# Patient Record
Sex: Female | Born: 1975 | Race: Black or African American | Hispanic: No | Marital: Married | Smoking: Never smoker
Health system: Southern US, Community
[De-identification: ages and names within clinical notes are randomized; demographics above are authoritative.]

## PROBLEM LIST (undated history)

## (undated) ENCOUNTER — Inpatient Hospital Stay (HOSPITAL_COMMUNITY): Payer: Self-pay

## (undated) DIAGNOSIS — E119 Type 2 diabetes mellitus without complications: Secondary | ICD-10-CM

## (undated) DIAGNOSIS — Z87440 Personal history of urinary (tract) infections: Secondary | ICD-10-CM

## (undated) DIAGNOSIS — Z758 Other problems related to medical facilities and other health care: Secondary | ICD-10-CM

## (undated) DIAGNOSIS — R87629 Unspecified abnormal cytological findings in specimens from vagina: Secondary | ICD-10-CM

## (undated) DIAGNOSIS — Z603 Acculturation difficulty: Secondary | ICD-10-CM

## (undated) DIAGNOSIS — O24419 Gestational diabetes mellitus in pregnancy, unspecified control: Secondary | ICD-10-CM

## (undated) DIAGNOSIS — D649 Anemia, unspecified: Secondary | ICD-10-CM

## (undated) DIAGNOSIS — Z789 Other specified health status: Secondary | ICD-10-CM

## (undated) HISTORY — DX: Personal history of urinary (tract) infections: Z87.440

## (undated) HISTORY — DX: Unspecified abnormal cytological findings in specimens from vagina: R87.629

## (undated) HISTORY — DX: Acculturation difficulty: Z60.3

## (undated) HISTORY — DX: Type 2 diabetes mellitus without complications: E11.9

## (undated) HISTORY — DX: Anemia, unspecified: D64.9

## (undated) HISTORY — DX: Gestational diabetes mellitus in pregnancy, unspecified control: O24.419

## (undated) HISTORY — DX: Other specified health status: Z78.9

## (undated) HISTORY — DX: Other problems related to medical facilities and other health care: Z75.8

## (undated) HISTORY — PX: COLPOSCOPY: SHX161

---

## 2004-04-21 ENCOUNTER — Ambulatory Visit (HOSPITAL_COMMUNITY): Admission: RE | Admit: 2004-04-21 | Discharge: 2004-04-21 | Payer: Self-pay | Admitting: *Deleted

## 2004-09-17 ENCOUNTER — Ambulatory Visit: Payer: Self-pay | Admitting: *Deleted

## 2004-09-17 ENCOUNTER — Inpatient Hospital Stay (HOSPITAL_COMMUNITY): Admission: AD | Admit: 2004-09-17 | Discharge: 2004-09-20 | Payer: Self-pay | Admitting: *Deleted

## 2004-09-29 ENCOUNTER — Inpatient Hospital Stay (HOSPITAL_COMMUNITY): Admission: AD | Admit: 2004-09-29 | Discharge: 2004-09-29 | Payer: Self-pay | Admitting: *Deleted

## 2005-08-30 ENCOUNTER — Emergency Department (HOSPITAL_COMMUNITY): Admission: EM | Admit: 2005-08-30 | Discharge: 2005-08-30 | Payer: Self-pay | Admitting: Emergency Medicine

## 2005-10-22 ENCOUNTER — Ambulatory Visit: Payer: Self-pay | Admitting: Family Medicine

## 2005-10-23 ENCOUNTER — Ambulatory Visit: Payer: Self-pay | Admitting: *Deleted

## 2006-11-17 ENCOUNTER — Ambulatory Visit (HOSPITAL_COMMUNITY): Admission: RE | Admit: 2006-11-17 | Discharge: 2006-11-17 | Payer: Self-pay | Admitting: Family Medicine

## 2007-03-21 ENCOUNTER — Inpatient Hospital Stay (HOSPITAL_COMMUNITY): Admission: AD | Admit: 2007-03-21 | Discharge: 2007-03-23 | Payer: Self-pay | Admitting: Obstetrics & Gynecology

## 2007-03-21 ENCOUNTER — Ambulatory Visit: Payer: Self-pay | Admitting: Obstetrics & Gynecology

## 2009-02-24 ENCOUNTER — Inpatient Hospital Stay (HOSPITAL_COMMUNITY): Admission: AD | Admit: 2009-02-24 | Discharge: 2009-02-24 | Payer: Self-pay | Admitting: Obstetrics & Gynecology

## 2009-02-26 ENCOUNTER — Inpatient Hospital Stay (HOSPITAL_COMMUNITY): Admission: AD | Admit: 2009-02-26 | Discharge: 2009-02-26 | Payer: Self-pay | Admitting: Obstetrics & Gynecology

## 2009-03-04 ENCOUNTER — Inpatient Hospital Stay (HOSPITAL_COMMUNITY): Admission: AD | Admit: 2009-03-04 | Discharge: 2009-03-04 | Payer: Self-pay | Admitting: Obstetrics & Gynecology

## 2010-04-04 ENCOUNTER — Emergency Department (HOSPITAL_COMMUNITY): Admission: EM | Admit: 2010-04-04 | Discharge: 2010-04-04 | Payer: Self-pay | Admitting: Emergency Medicine

## 2010-11-01 ENCOUNTER — Inpatient Hospital Stay (INDEPENDENT_AMBULATORY_CARE_PROVIDER_SITE_OTHER)
Admission: RE | Admit: 2010-11-01 | Discharge: 2010-11-01 | Disposition: A | Discharge status: Home / Self Care | Payer: 59 | Source: Ambulatory Visit | Attending: Family Medicine | Admitting: Family Medicine

## 2010-11-01 DIAGNOSIS — J019 Acute sinusitis, unspecified: Secondary | ICD-10-CM

## 2010-11-01 DIAGNOSIS — J310 Chronic rhinitis: Secondary | ICD-10-CM

## 2010-12-22 LAB — URINE MICROSCOPIC-ADD ON

## 2010-12-22 LAB — CBC
HCT: 31.3 % — ABNORMAL LOW (ref 36.0–46.0)
Hemoglobin: 10.5 g/dL — ABNORMAL LOW (ref 12.0–15.0)
MCHC: 33.5 g/dL (ref 30.0–36.0)
MCV: 83.1 fL (ref 78.0–100.0)
Platelets: 314 10*3/uL (ref 150–400)
RBC: 3.76 MIL/uL — ABNORMAL LOW (ref 3.87–5.11)
RDW: 15.4 % (ref 11.5–15.5)
WBC: 6.1 10*3/uL (ref 4.0–10.5)

## 2010-12-22 LAB — URINALYSIS, ROUTINE W REFLEX MICROSCOPIC
Bilirubin Urine: NEGATIVE
Glucose, UA: NEGATIVE mg/dL
Ketones, ur: NEGATIVE mg/dL
Leukocytes, UA: NEGATIVE
Nitrite: NEGATIVE
Protein, ur: NEGATIVE mg/dL
Specific Gravity, Urine: <1.005 — ABNORMAL LOW (ref 1.005–1.030)
Urobilinogen, UA: 0.2 mg/dL (ref 0.0–1.0)
pH: 6 (ref 5.0–8.0)

## 2010-12-22 LAB — HCG, QUANTITATIVE, PREGNANCY
hCG, Beta Chain, Quant, S: 13 m[IU]/mL — ABNORMAL HIGH (ref <5)
hCG, Beta Chain, Quant, S: 1324 m[IU]/mL — ABNORMAL HIGH (ref <5)
hCG, Beta Chain, Quant, S: 148 m[IU]/mL — ABNORMAL HIGH (ref <5)

## 2010-12-22 LAB — ABO/RH: ABO/RH(D): O POS

## 2010-12-22 LAB — POCT PREGNANCY, URINE: Preg Test, Ur: POSITIVE

## 2010-12-22 LAB — WET PREP, GENITAL
Clue Cells Wet Prep HPF POC: NONE SEEN
Trich, Wet Prep: NONE SEEN
Yeast Wet Prep HPF POC: NONE SEEN

## 2010-12-22 LAB — GC/CHLAMYDIA PROBE AMP, GENITAL
Chlamydia, DNA Probe: NEGATIVE
GC Probe Amp, Genital: NEGATIVE

## 2011-01-10 ENCOUNTER — Emergency Department (HOSPITAL_COMMUNITY)
Admission: EM | Admit: 2011-01-10 | Discharge: 2011-01-10 | Disposition: A | Discharge status: Home / Self Care | Payer: 59 | Attending: Emergency Medicine | Admitting: Emergency Medicine

## 2011-01-10 DIAGNOSIS — K089 Disorder of teeth and supporting structures, unspecified: Secondary | ICD-10-CM | POA: Insufficient documentation

## 2011-01-10 DIAGNOSIS — R000 Tachycardia, unspecified: Secondary | ICD-10-CM | POA: Insufficient documentation

## 2011-04-14 ENCOUNTER — Ambulatory Visit (HOSPITAL_COMMUNITY)
Admission: RE | Admit: 2011-04-14 | Discharge: 2011-04-14 | Disposition: A | Discharge status: Home / Self Care | Payer: 59 | Source: Ambulatory Visit | Attending: Gynecology | Admitting: Gynecology

## 2011-04-14 NOTE — Progress Notes (Signed)
Genetic Counseling  High-Risk Gestation Note  Appointment Date:  04/14/2011 Referred By: Janifer Adie, MD Date of Birth:  1976/04/23 Partner:  Samuel [place] Ibnidris   I saw Ms. Gina Chung for preconception genetic counseling to discuss results of her cystic fibrosis carrier screen.   Ms. 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 We spent time reviewing the autosomal recessive inheritance of cystic fibrosis and the 25% chance, when both parents are carriers, to have a child with CF. We discussed that there are thought to be thousands of mutations which can cause the CF gene to not function properly.  However, there are also variants in the gene, or polymorphisms, which do not cause abnormal functioning of the gene.  Initially it was thought that the I148T mutation was a severe gene mutation.  As they have learned more about the gene and these mutations, it was learned that this was only a deleterious mutation when it was inherited in cis (on the same chromosome as) the 3199del6 mutation.  Ms. Rhodes was tested for the 3199del6 mutation and was found to be negative for that mutation.  The most recent literature suggests that the I148T mutation not even be tested for as part of a general population screening panel.  However, if it is part of the screening panel, the 3199del6 mutation should be tested in reflex, as was done for Ms. Ledo.  Thus, this is considered to be a negative CF carrier screen for Ms. Rufino. Ms. Zentner reported that her partner recently had CF carrier screening, which was negative for the mutations screened. We do not have her partner's lab report to confirm this report.  We reviewed that a negative CF screen reduces but does not eliminate the chance to be a CF carrier. CF carrier screening is reported to detect approximately 69% of carriers in the African American population. The exact detection rate and thus risk  reduction from a negative carrier screen in the [place] population is not known. Given that both Ms. Prange and her partner have had negative CF carrier screens, the risk for cystic fibrosis to be in a future pregnancy together has been significantly reduced. They understand that in [place], the newborn screening test will detect CF.  Both family histories were reviewed and found to be contributory  for deafness in a maternal first cousin once removed for the patient.  The underlying cause of her deafness is not known. She is otherwise healthy. Congenital deafness is heterogeneous, meaning there are many different genes which, if their function is altered, can result in deafness.  Environmental factors may also contribute to hearing loss.  Hearing loss may be one feature of an underlying genetic condition that may or may not place other family members at risk. Should they learn of a genetic cause, this might have implications for other family members.  Additional information is needed in order to assess the risk to the current pregnancy.   Additionally, the patient reported a nephew through her partner (his brother's son) who passed away in infancy due to bleeding following circumcision. The underlying cause is unknown. No additional relatives were reported to have symptoms of bleeding conditions, including this relative's other children.  Bleeding conditions are very common and encompass many specific disorders which can be passed through a family (inherited) in different ways.  However, it is unlikely that a future pregnancy for Ms. Woodhead and her partner is at increased  risk for a bleeding disorder given that neither the patient's partner nor his brother (the father of the affected son) have bleeding problems or any symptoms of a bleeding disorder.   The patient also reported her partner's two nephews (through a different brother) with what was described as ureter problems for one and extra fluid in  the kidneys requiring surgical correction for the other. They are otherwise in good health. We discussed that these features could be isolated or could occur due to an underlying genetic condition. Additional information is needed regarding the underlying cause in order to assess recurrence risk for relatives. Without further information regarding the provided family history, an accurate genetic risk cannot be calculated.   Further genetic counseling is warranted if more information is obtained. See scanned pedigree for complete family history information.   She denied exposure to environmental toxins or chemical agents.  She denied the use of alcohol, tobacco or street drugs.  She denied significant viral illnesses.  Her medical and surgical history were noncontributory.  We counseled the patient for approximately 25 minutes regarding the above risks.     Clydie Braun Imoni Kohen, MS, [facility] 04/14/2011

## 2011-06-15 ENCOUNTER — Encounter (HOSPITAL_COMMUNITY): Payer: Self-pay

## 2011-06-15 ENCOUNTER — Inpatient Hospital Stay (HOSPITAL_COMMUNITY)
Admission: AD | Admit: 2011-06-15 | Discharge: 2011-06-15 | Disposition: A | Discharge status: Home / Self Care | Payer: 59 | Source: Ambulatory Visit | Attending: Obstetrics and Gynecology | Admitting: Obstetrics and Gynecology

## 2011-06-15 DIAGNOSIS — R51 Headache: Secondary | ICD-10-CM

## 2011-06-15 DIAGNOSIS — R519 Headache, unspecified: Secondary | ICD-10-CM

## 2011-06-15 DIAGNOSIS — O99891 Other specified diseases and conditions complicating pregnancy: Secondary | ICD-10-CM | POA: Insufficient documentation

## 2011-06-15 DIAGNOSIS — R112 Nausea with vomiting, unspecified: Secondary | ICD-10-CM

## 2011-06-15 LAB — CBC
Hemoglobin: 11 g/dL — ABNORMAL LOW (ref 12.0–15.0)
MCH: 29.2 pg (ref 26.0–34.0)
MCHC: 33.6 g/dL (ref 30.0–36.0)
MCV: 86.7 fL (ref 78.0–100.0)
RBC: 3.77 MIL/uL — ABNORMAL LOW (ref 3.87–5.11)
WBC: 6 10*3/uL (ref 4.0–10.5)

## 2011-06-15 LAB — URINALYSIS, ROUTINE W REFLEX MICROSCOPIC
Bilirubin Urine: NEGATIVE
Glucose, UA: NEGATIVE mg/dL
Hgb urine dipstick: NEGATIVE
Leukocytes, UA: NEGATIVE
Nitrite: NEGATIVE
Protein, ur: NEGATIVE mg/dL
Specific Gravity, Urine: 1.02 (ref 1.005–1.030)
Urobilinogen, UA: 0.2 mg/dL (ref 0.0–1.0)
pH: 7.5 (ref 5.0–8.0)

## 2011-06-15 MED ORDER — PROMETHAZINE HCL 25 MG PO TABS: 25.0000 mg | ORAL_TABLET | Freq: Four times a day (QID) | ORAL | Status: AC | PRN

## 2011-06-15 MED ORDER — BUTALBITAL-APAP-CAFFEINE 50-325-40 MG PO TABS: 1.0000 | ORAL_TABLET | Freq: Four times a day (QID) | ORAL | Status: DC | PRN

## 2011-06-15 NOTE — Progress Notes (Signed)
Pt state she has a history of migraines. Has had a headache constantly for about 10 days, some days worse than others. Has had nausea and vomiting at least once every day for about 6 weeks. Feels tired and has shortness of breath worse with moving.

## 2011-06-15 NOTE — ED Provider Notes (Signed)
History   Pt presents today c/o daily HAs, nausea, and feeling tired. She states she does not have a HA today. She reports having vomiting about 1-2 times per day. She denies abd pain, vag dc, bleeding, or any other sx at this time.  Chief Complaint  Patient presents with  . Headache   HPI  OB History    Grav Para Term Preterm Abortions TAB SAB Ect Mult Living   4 2 2  0 1 0 1 0 0 2      Past Medical History  Diagnosis Date  . Migraine     History reviewed. No pertinent past surgical history.  No family history on file.  History  Substance Use Topics  . Smoking status: Never Smoker   . Smokeless tobacco: Never Used  . Alcohol Use: No    Allergies: No Known Allergies  Prescriptions prior to admission  Medication Sig Dispense Refill  . acetaminophen (TYLENOL) 500 MG tablet Take 500 mg by mouth every 6 (six) hours as needed. For headache.       . prenatal vitamin w/FE, FA (PRENATAL 1 + 1) 27-1 MG TABS Take 1 tablet by mouth daily.          Review of Systems  Constitutional: Positive for malaise/fatigue. Negative for fever.  Eyes: Negative for blurred vision.  Respiratory: Positive for shortness of breath. Negative for cough, hemoptysis, sputum production and wheezing.   Cardiovascular: Negative for chest pain and palpitations.  Gastrointestinal: Positive for nausea and vomiting. Negative for abdominal pain, diarrhea and constipation.  Genitourinary: Negative for dysuria, urgency, frequency and hematuria.  Neurological: Positive for headaches. Negative for dizziness.  Psychiatric/Behavioral: Negative for depression and suicidal ideas.   Physical Exam   Blood pressure 111/64, pulse 73, temperature 98.3 F (36.8 C), temperature source Oral, resp. rate 18, height 5' 3.5" (1.613 m), weight 154 lb 6.4 oz (70.035 kg), last menstrual period 03/20/2011, SpO2 99.00%.  Physical Exam  Constitutional: She is oriented to person, place, and time. She appears well-developed and  well-nourished. No distress.  HENT:  Head: Normocephalic and atraumatic.  Eyes: EOM are normal. Pupils are equal, round, and reactive to light.  Cardiovascular: Normal rate, regular rhythm and normal heart sounds.  Exam reveals no gallop and no friction rub.   No murmur heard. Respiratory: Effort normal and breath sounds normal. No respiratory distress. She has no wheezes. She has no rales. She exhibits no tenderness.  GI: Soft. She exhibits no distension and no mass. There is no tenderness. There is no rebound and no guarding.  Neurological: She is alert and oriented to person, place, and time.  Skin: Skin is warm and dry. She is not diaphoretic.  Psychiatric: She has a normal mood and affect. Her behavior is normal. Judgment and thought content normal.    MAU Course  Procedures  Results for orders placed during the hospital encounter of 06/15/11 (from the past 24 hour(s))  URINALYSIS, ROUTINE W REFLEX MICROSCOPIC     Status: Abnormal   Collection Time   06/15/11 10:20 AM      Component Value Range   Color, Urine YELLOW  YELLOW    Appearance CLOUDY (*) CLEAR    Specific Gravity, Urine 1.020  1.005 - 1.030    pH 7.5  5.0 - 8.0    Glucose, UA NEGATIVE  NEGATIVE (mg/dL)   Hgb urine dipstick NEGATIVE  NEGATIVE    Bilirubin Urine NEGATIVE  NEGATIVE    Ketones, ur NEGATIVE  NEGATIVE (mg/dL)  Protein, ur NEGATIVE  NEGATIVE (mg/dL)   Urobilinogen, UA 0.2  0.0 - 1.0 (mg/dL)   Nitrite NEGATIVE  NEGATIVE    Leukocytes, UA NEGATIVE  NEGATIVE   CBC     Status: Abnormal   Collection Time   06/15/11 10:30 AM      Component Value Range   WBC 6.0  4.0 - 10.5 (K/uL)   RBC 3.77 (*) 3.87 - 5.11 (MIL/uL)   Hemoglobin 11.0 (*) 12.0 - 15.0 (g/dL)   HCT 57.8 (*) 46.9 - 46.0 (%)   MCV 86.7  78.0 - 100.0 (fL)   MCH 29.2  26.0 - 34.0 (pg)   MCHC 33.6  30.0 - 36.0 (g/dL)   RDW 62.9  52.8 - 41.3 (%)   Platelets 279  150 - 400 (K/uL)     Assessment and Plan  Pregnancy: discussed with pt at  length. Will give Rx for fioricet for prn HA. Will give Rx for phenergan. She will begin prenatal care. Discussed diet, activity, risks, and precautions.  Clinton Gallant. Tasharra Nodine III, DrHSc, MPAS, PA-C  06/15/2011, 10:45 AM   Henrietta Hoover, PA 06/15/11 1102

## 2011-06-16 NOTE — ED Provider Notes (Signed)
Agree with above note.  Gina Chung 06/16/2011 7:48 AM

## 2011-06-30 LAB — CBC
HCT: 33.9 — ABNORMAL LOW
HCT: 37.5
Hemoglobin: 11.3 — ABNORMAL LOW
Hemoglobin: 12.5
MCHC: 33.3
MCHC: 33.4
MCV: 91.6
MCV: 91.9
Platelets: 239
Platelets: 250
RBC: 3.69 — ABNORMAL LOW
RBC: 4.1
RDW: 14.9 — ABNORMAL HIGH
RDW: 15.1 — ABNORMAL HIGH
WBC: 12.7 — ABNORMAL HIGH
WBC: 8.1

## 2011-06-30 LAB — RPR: RPR Ser Ql: NONREACTIVE

## 2011-08-11 ENCOUNTER — Other Ambulatory Visit (HOSPITAL_COMMUNITY): Payer: Self-pay | Admitting: Nurse Practitioner

## 2011-08-11 DIAGNOSIS — Z3689 Encounter for other specified antenatal screening: Secondary | ICD-10-CM

## 2011-08-11 LAB — RPR: RPR: NONREACTIVE

## 2011-08-11 LAB — ABO/RH

## 2011-08-11 LAB — HEPATITIS B SURFACE ANTIGEN: Hepatitis B Surface Ag: NEGATIVE

## 2011-08-11 LAB — ANTIBODY SCREEN: Antibody Screen: NEGATIVE

## 2011-08-11 LAB — HIV ANTIBODY (ROUTINE TESTING W REFLEX): HIV: NONREACTIVE

## 2011-08-11 LAB — GC/CHLAMYDIA PROBE AMP, GENITAL: Gonorrhea: NEGATIVE

## 2011-08-14 ENCOUNTER — Ambulatory Visit (HOSPITAL_COMMUNITY)
Admission: RE | Admit: 2011-08-14 | Discharge: 2011-08-14 | Disposition: A | Discharge status: Home / Self Care | Payer: 59 | Source: Ambulatory Visit | Attending: Nurse Practitioner | Admitting: Nurse Practitioner

## 2011-08-14 DIAGNOSIS — Z1389 Encounter for screening for other disorder: Secondary | ICD-10-CM | POA: Insufficient documentation

## 2011-08-14 DIAGNOSIS — O365990 Maternal care for other known or suspected poor fetal growth, unspecified trimester, not applicable or unspecified: Secondary | ICD-10-CM | POA: Insufficient documentation

## 2011-08-14 DIAGNOSIS — Z3689 Encounter for other specified antenatal screening: Secondary | ICD-10-CM

## 2011-08-14 DIAGNOSIS — O358XX0 Maternal care for other (suspected) fetal abnormality and damage, not applicable or unspecified: Secondary | ICD-10-CM | POA: Insufficient documentation

## 2011-08-14 DIAGNOSIS — Z363 Encounter for antenatal screening for malformations: Secondary | ICD-10-CM | POA: Insufficient documentation

## 2011-09-15 NOTE — L&D Delivery Note (Signed)
Delivery Note After 1 push, att 5:37 AM a viable female was delivered via Vaginal, Spontaneous Delivery (Presentation: Right Occiput Anterior).  APGAR: 8/8, ; weight .  pending Placenta status: ,intact with a 3V Cord:  with the following complications: None.  Anesthesia: Epidural  Episiotomy: none Lacerations: none Suture Repair: n/a Est. Blood Loss (mL): 350cc  Mom to postpartum.  Baby to nursery-stable. Delivery by Dr. Konrad Dolores with myself in attendance Gina Chung,Gina Chung 12/31/2011, 5:44 AM

## 2011-11-28 ENCOUNTER — Ambulatory Visit (INDEPENDENT_AMBULATORY_CARE_PROVIDER_SITE_OTHER): Payer: 59 | Admitting: Family Medicine

## 2011-11-28 VITALS — BP 129/79 | HR 99 | Temp 98.5°F | Resp 16 | Ht 63.0 in | Wt 174.0 lb

## 2011-11-28 DIAGNOSIS — J019 Acute sinusitis, unspecified: Secondary | ICD-10-CM

## 2011-11-28 DIAGNOSIS — H109 Unspecified conjunctivitis: Secondary | ICD-10-CM

## 2011-11-28 DIAGNOSIS — Z3480 Encounter for supervision of other normal pregnancy, unspecified trimester: Secondary | ICD-10-CM

## 2011-11-28 MED ORDER — TOBRAMYCIN 0.3 % OP SOLN: 1.0000 [drp] | Freq: Three times a day (TID) | OPHTHALMIC | Status: AC

## 2011-11-28 MED ORDER — MOMETASONE FUROATE 50 MCG/ACT NA SUSP: 2.0000 | Freq: Every day | NASAL | Status: DC

## 2011-11-28 MED ORDER — AMOXICILLIN 875 MG PO TABS: 875.0000 mg | ORAL_TABLET | Freq: Two times a day (BID) | ORAL | Status: AC

## 2011-11-28 NOTE — Progress Notes (Signed)
  Subjective:    Patient ID: Gina Chung, female    DOB: November 02, 1975, 36 y.o.   MRN: [id number]  HPI  Patient presents to clinic complaining of post nasal drainage and productive cough of purulent sputum for the last 10 days.  Cough more frequent at night and does experiences early am chest tightness. Patient complains of post tussive emesis Eyes slightly pink  Using OTC Robitussin  [redacted] weeks pregnant [facility]  Review of Systems  Constitutional: Negative for chills. Fever: tactile fever, using tylenol regularly   Eyes: Positive for redness (with light yellow ocular discharge) and itching (and patient rubbing eye).  Respiratory: Positive for cough. Negative for shortness of breath and wheezing.        Objective:   Physical Exam  Constitutional: She appears well-developed and well-nourished.  Eyes: Right conjunctiva is injected. Left conjunctiva is injected.  Neck: Neck supple.  Cardiovascular: Normal rate, regular rhythm and normal heart sounds.   Pulmonary/Chest: Effort normal and breath sounds normal.  Abdominal:    Lymphadenopathy:    She has cervical adenopathy.  Neurological: She is alert.  Skin: Skin is warm.  Psychiatric: She has a normal mood and affect.        Assessment & Plan:   1. Sinusitis acute  amoxicillin (AMOXIL) 875 MG tablet, mometasone (NASONEX) 50 MCG/ACT nasal spray  2. Conjunctivitis  tobramycin (TOBREX) 0.3 % ophthalmic solution  3. Normal pregnancy, 36 weeks     Anticipatory guidance

## 2011-11-28 NOTE — Progress Notes (Deleted)
  Subjective:    Patient ID: Gina Chung, female    DOB: Jun 29, 1976, 36 y.o.   MRN: [id number]  HPI    Review of Systems     Objective:   Physical Exam        Assessment & Plan:

## 2011-12-18 ENCOUNTER — Encounter (HOSPITAL_COMMUNITY): Payer: Self-pay | Admitting: *Deleted

## 2011-12-18 ENCOUNTER — Inpatient Hospital Stay (HOSPITAL_COMMUNITY)
Admission: AD | Admit: 2011-12-18 | Discharge: 2011-12-18 | Disposition: A | Discharge status: Home / Self Care | Payer: 59 | Source: Ambulatory Visit | Attending: Obstetrics and Gynecology | Admitting: Obstetrics and Gynecology

## 2011-12-18 DIAGNOSIS — R300 Dysuria: Secondary | ICD-10-CM | POA: Insufficient documentation

## 2011-12-18 DIAGNOSIS — R109 Unspecified abdominal pain: Secondary | ICD-10-CM | POA: Insufficient documentation

## 2011-12-18 LAB — URINALYSIS, ROUTINE W REFLEX MICROSCOPIC
Glucose, UA: NEGATIVE mg/dL
Leukocytes, UA: NEGATIVE
Nitrite: NEGATIVE
Protein, ur: NEGATIVE mg/dL
Urobilinogen, UA: 0.2 mg/dL (ref 0.0–1.0)

## 2011-12-18 LAB — CBC
Hemoglobin: 11.4 g/dL — ABNORMAL LOW (ref 12.0–15.0)
MCHC: 32.2 g/dL (ref 30.0–36.0)
Platelets: 224 10*3/uL (ref 150–400)
RBC: 3.83 MIL/uL — ABNORMAL LOW (ref 3.87–5.11)

## 2011-12-18 LAB — DIFFERENTIAL
Basophils Relative: 0 % (ref 0–1)
Monocytes Relative: 5 % (ref 3–12)
Neutro Abs: 5.1 10*3/uL (ref 1.7–7.7)
Neutrophils Relative %: 66 % (ref 43–77)

## 2011-12-18 LAB — WET PREP, GENITAL: Trich, Wet Prep: NONE SEEN

## 2011-12-18 NOTE — MAU Note (Signed)
Patient states she has had left flank pain and pain and burning with urination since yesterday. Has a history of UTI with this pregnancy with the same symptoms.

## 2011-12-18 NOTE — Discharge Instructions (Signed)
Pregnancy - Third Trimester The third trimester of pregnancy (the last 3 months) is a period of the most rapid growth for you and your baby. The baby approaches a length of 20 inches and a weight of 6 to 10 pounds. The baby is adding on fat and getting ready for life outside your body. While inside, babies have periods of sleeping and waking, suck their thumbs, and hiccups. You can often feel small contractions of the uterus. This is false labor. It is also called Braxton-Hicks contractions. This is like a practice for labor. The usual problems in this stage of pregnancy include more difficulty breathing, swelling of the hands and feet from water retention, and having to urinate more often because of the uterus and baby pressing on your bladder.  PRENATAL EXAMS  Blood work may continue to be done during prenatal exams. These tests are done to check on your health and the probable health of your baby. Blood work is used to follow your blood levels (hemoglobin). Anemia (low hemoglobin) is common during pregnancy. Iron and vitamins are given to help prevent this. You may also continue to be checked for diabetes. Some of the past blood tests may be done again.   The size of the uterus is measured during each visit. This makes sure your baby is growing properly according to your pregnancy dates.   Your blood pressure is checked every prenatal visit. This is to make sure you are not getting toxemia.   Your urine is checked every prenatal visit for infection, diabetes and protein.   Your weight is checked at each visit. This is done to make sure gains are happening at the suggested rate and that you and your baby are growing normally.   Sometimes, an ultrasound is performed to confirm the position and the proper growth and development of the baby. This is a test done that bounces harmless sound waves off the baby so your caregiver can more accurately determine due dates.   Discuss the type of pain  medication and anesthesia you will have during your labor and delivery.   Discuss the possibility and anesthesia if a Cesarean Section might be necessary.   Inform your caregiver if there is any mental or physical violence at home.  Sometimes, a specialized non-stress test, contraction stress test and biophysical profile are done to make sure the baby is not having a problem. Checking the amniotic fluid surrounding the baby is called an amniocentesis. The amniotic fluid is removed by sticking a needle into the belly (abdomen). This is sometimes done near the end of pregnancy if an early delivery is required. In this case, it is done to help make sure the baby's lungs are mature enough for the baby to live outside of the womb. If the lungs are not mature and it is unsafe to deliver the baby, an injection of cortisone medication is given to the mother 1 to 2 days before the delivery. This helps the baby's lungs mature and makes it safer to deliver the baby. CHANGES OCCURING IN THE THIRD TRIMESTER OF PREGNANCY Your body goes through many changes during pregnancy. They vary from person to person. Talk to your caregiver about changes you notice and are concerned about.  During the last trimester, you have probably had an increase in your appetite. It is normal to have cravings for certain foods. This varies from person to person and pregnancy to pregnancy.   You may begin to get stretch marks on your hips,   abdomen, and breasts. These are normal changes in the body during pregnancy. There are no exercises or medications to take which prevent this change.   Constipation may be treated with a stool softener or adding bulk to your diet. Drinking lots of fluids, fiber in vegetables, fruits, and whole grains are helpful.   Exercising is also helpful. If you have been very active up until your pregnancy, most of these activities can be continued during your pregnancy. If you have been less active, it is helpful  to start an exercise program such as walking. Consult your caregiver before starting exercise programs.   Avoid all smoking, alcohol, un-prescribed drugs, herbs and "street drugs" during your pregnancy. These chemicals affect the formation and growth of the baby. Avoid chemicals throughout the pregnancy to ensure the delivery of a healthy infant.   Backache, varicose veins and hemorrhoids may develop or get worse.   You will tire more easily in the third trimester, which is normal.   The baby's movements may be stronger and more often.   You may become short of breath easily.   Your belly button may stick out.   A yellow discharge may leak from your breasts called colostrum.   You may have a bloody mucus discharge. This usually occurs a few days to a week before labor begins.  HOME CARE INSTRUCTIONS   Keep your caregiver's appointments. Follow your caregiver's instructions regarding medication use, exercise, and diet.   During pregnancy, you are providing food for you and your baby. Continue to eat regular, well-balanced meals. Choose foods such as meat, fish, milk and other low fat dairy products, vegetables, fruits, and whole-grain breads and cereals. Your caregiver will tell you of the ideal weight gain.   A physical sexual relationship may be continued throughout pregnancy if there are no other problems such as early (premature) leaking of amniotic fluid from the membranes, vaginal bleeding, or belly (abdominal) pain.   Exercise regularly if there are no restrictions. Check with your caregiver if you are unsure of the safety of your exercises. Greater weight gain will occur in the last 2 trimesters of pregnancy. Exercising helps:   Control your weight.   Get you in shape for labor and delivery.   You lose weight after you deliver.   Rest a lot with legs elevated, or as needed for leg cramps or low back pain.   Wear a good support or jogging bra for breast tenderness during  pregnancy. This may help if worn during sleep. Pads or tissues may be used in the bra if you are leaking colostrum.   Do not use hot tubs, steam rooms, or saunas.   Wear your seat belt when driving. This protects you and your baby if you are in an accident.   Avoid raw meat, cat litter boxes and soil used by cats. These carry germs that can cause birth defects in the baby.   It is easier to loose urine during pregnancy. Tightening up and strengthening the pelvic muscles will help with this problem. You can practice stopping your urination while you are going to the bathroom. These are the same muscles you need to strengthen. It is also the muscles you would use if you were trying to stop from passing gas. You can practice tightening these muscles up 10 times a set and repeating this about 3 times per day. Once you know what muscles to tighten up, do not perform these exercises during urination. It is more likely   to cause an infection by backing up the urine.   Ask for help if you have financial, counseling or nutritional needs during pregnancy. Your caregiver will be able to offer counseling for these needs as well as refer you for other special needs.   Make a list of emergency phone numbers and have them available.   Plan on getting help from family or friends when you go home from the hospital.   Make a trial run to the hospital.   Take prenatal classes with the father to understand, practice and ask questions about the labor and delivery.   Prepare the baby's room/nursery.   Do not travel out of the city unless it is absolutely necessary and with the advice of your caregiver.   Wear only low or no heal shoes to have better balance and prevent falling.  MEDICATIONS AND DRUG USE IN PREGNANCY  Take prenatal vitamins as directed. The vitamin should contain 1 milligram of folic acid. Keep all vitamins out of reach of children. Only a couple vitamins or tablets containing iron may be fatal  to a baby or young child when ingested.   Avoid use of all medications, including herbs, over-the-counter medications, not prescribed or suggested by your caregiver. Only take over-the-counter or prescription medicines for pain, discomfort, or fever as directed by your caregiver. Do not use aspirin, ibuprofen (Motrin, Advil, Nuprin) or naproxen (Aleve) unless OK'd by your caregiver.   Let your caregiver also know about herbs you may be using.   Alcohol is related to a number of birth defects. This includes fetal alcohol syndrome. All alcohol, in any form, should be avoided completely. Smoking will cause low birth rate and premature babies.   Street/illegal drugs are very harmful to the baby. They are absolutely forbidden. A baby born to an addicted mother will be addicted at birth. The baby will go through the same withdrawal an adult does.  SEEK MEDICAL CARE IF: You have any concerns or worries during your pregnancy. It is better to call with your questions if you feel they cannot wait, rather than worry about them. DECISIONS ABOUT CIRCUMCISION You may or may not know the sex of your baby. If you know your baby is a boy, it may be time to think about circumcision. Circumcision is the removal of the foreskin of the penis. This is the skin that covers the sensitive end of the penis. There is no proven medical need for this. Often this decision is made on what is popular at the time or based upon religious beliefs and social issues. You can discuss these issues with your caregiver or pediatrician. SEEK IMMEDIATE MEDICAL CARE IF:   An unexplained oral temperature above 102 F (38.9 C) develops, or as your caregiver suggests.   You have leaking of fluid from the vagina (birth canal). If leaking membranes are suspected, take your temperature and tell your caregiver of this when you call.   There is vaginal spotting, bleeding or passing clots. Tell your caregiver of the amount and how many pads are  used.   You develop a bad smelling vaginal discharge with a change in the color from clear to white.   You develop vomiting that lasts more than 24 hours.   You develop chills or fever.   You develop shortness of breath.   You develop burning on urination.   You loose more than 2 pounds of weight or gain more than 2 pounds of weight or as suggested by your   caregiver.   You notice sudden swelling of your face, hands, and feet or legs.   You develop belly (abdominal) pain. Round ligament discomfort is a common non-cancerous (benign) cause of abdominal pain in pregnancy. Your caregiver still must evaluate you.   You develop a severe headache that does not go away.   You develop visual problems, blurred or double vision.   If you have not felt your baby move for more than 1 hour. If you think the baby is not moving as much as usual, eat something with sugar in it and lie down on your left side for an hour. The baby should move at least 4 to 5 times per hour. Call right away if your baby moves less than that.   You fall, are in a car accident or any kind of trauma.   There is mental or physical violence at home.  Document Released: 08/25/2001 Document Revised: 08/20/2011 Document Reviewed: 02/27/2009 [employer] Patient Information 2012 [employer]. 

## 2011-12-18 NOTE — MAU Provider Note (Signed)
Chief Complaint:  Flank Pain and Dysuria   First Provider Initiated Contact with Patient 12/18/11 1530     HPI  Gina Chung is  36 y.o. [id number] at [redacted]w[redacted]d presents with left flank pain and burning pain with urination.  She also reports increased white vaginal discharge since she started taking an antibiotic for recent URI. Denies contractions, leakage of fluid, vaginal bleeding, n/v, fever/chills. Reports good fetal movement.   Pregnancy Course: uncomplicated  Past Medical History: Past Medical History  Diagnosis Date  . Migraine     Past Surgical History: History reviewed. No pertinent past surgical history.  Family History: History reviewed. No pertinent family history.  Social History: History  Substance Use Topics  . Smoking status: Never Smoker   . Smokeless tobacco: Never Used  . Alcohol Use: No    Allergies: No Known Allergies  Meds:  Prescriptions prior to admission  Medication Sig Dispense Refill  . acetaminophen (TYLENOL) 500 MG tablet Take 1,000 mg by mouth every 6 (six) hours as needed. For headache.      . calcium carbonate (TUMS - DOSED IN MG ELEMENTAL CALCIUM) 500 MG chewable tablet Chew 4 tablets by mouth daily as needed. Stomach acid      . Prenatal Vit-Fe Fumarate-FA (PRENATAL MULTIVITAMIN) TABS Take 1 tablet by mouth daily.             Physical Exam  Blood pressure 134/86, pulse 94, temperature 98.6 F (37 C), temperature source Oral, resp. rate 16, height 5\' 3"  (1.6 m), weight 78.835 kg (173 lb 12.8 oz), last menstrual period 03/20/2011. GENERAL: Well-developed, well-nourished female in no acute distress.  ABDOMEN: Soft, nontender, nondistended, gravid.  EXTREMITIES: Nontender, no edema, 2+ distal pulses. SPECULUM EXAM: Cervix pink, moderate amount white discharge, vaginal walls and external genitalia normal CERVIX: 1.5cm/50/-2  FHT:  Baseline 135 , moderate variability, accelerations present, no decelerations Contractions: None    Labs: Recent Results (from the past 840 hour(s))  STREP B DNA PROBE      Component Value Range   GBS Negative    URINALYSIS, ROUTINE W REFLEX MICROSCOPIC   Collection Time   12/18/11  2:25 PM      Component Value Range   Color, Urine YELLOW  YELLOW    APPearance CLEAR  CLEAR    Specific Gravity, Urine <1.005 (*) 1.005 - 1.030    pH 6.5  5.0 - 8.0    Glucose, UA NEGATIVE  NEGATIVE (mg/dL)   Hgb urine dipstick NEGATIVE  NEGATIVE    Bilirubin Urine NEGATIVE  NEGATIVE    Ketones, ur NEGATIVE  NEGATIVE (mg/dL)   Protein, ur NEGATIVE  NEGATIVE (mg/dL)   Urobilinogen, UA 0.2  0.0 - 1.0 (mg/dL)   Nitrite NEGATIVE  NEGATIVE    Leukocytes, UA NEGATIVE  NEGATIVE   URINE CULTURE   Collection Time   12/18/11  2:25 PM      Component Value Range   Specimen Description OB CLEAN CATCH     Special Requests NONE     Culture  Setup Time [id number]     Colony Count 9,000 COLONIES/ML     Culture INSIGNIFICANT GROWTH     Report Status 12/20/2011 FINAL    WET PREP, GENITAL   Collection Time   12/18/11  3:37 PM      Component Value Range   Yeast Wet Prep HPF POC NONE SEEN  NONE SEEN    Trich, Wet Prep NONE SEEN  NONE SEEN    Clue Cells Wet  Prep HPF POC NONE SEEN  NONE SEEN    WBC, Wet Prep HPF POC FEW (*) NONE SEEN   GC/CHLAMYDIA PROBE AMP, GENITAL   Collection Time   12/18/11  3:40 PM      Component Value Range   GC Probe Amp, Genital NEGATIVE  NEGATIVE    Chlamydia, DNA Probe NEGATIVE  NEGATIVE   CBC   Collection Time   12/18/11  4:48 PM      Component Value Range   WBC 7.7  4.0 - 10.5 (K/uL)   RBC 3.83 (*) 3.87 - 5.11 (MIL/uL)   Hemoglobin 11.4 (*) 12.0 - 15.0 (g/dL)   HCT 16.1 (*) 09.6 - 46.0 (%)   MCV 92.4  78.0 - 100.0 (fL)   MCH 29.8  26.0 - 34.0 (pg)   MCHC 32.2  30.0 - 36.0 (g/dL)   RDW 04.5  40.9 - 81.1 (%)   Platelets 224  150 - 400 (K/uL)  DIFFERENTIAL   Collection Time   12/18/11  4:48 PM      Component Value Range   Neutrophils Relative 66  43 - 77 (%)   Neutro Abs 5.1   1.7 - 7.7 (K/uL)   Lymphocytes Relative 28  12 - 46 (%)   Lymphs Abs 2.2  0.7 - 4.0 (K/uL)   Monocytes Relative 5  3 - 12 (%)   Monocytes Absolute 0.4  0.1 - 1.0 (K/uL)   Eosinophils Relative 1  0 - 5 (%)   Eosinophils Absolute 0.1  0.0 - 0.7 (K/uL)   Basophils Relative 0  0 - 1 (%)   Basophils Absolute 0.0  0.0 - 0.1 (K/uL)  CBC   Collection Time   12/31/11  3:31 AM      Component Value Range   WBC 7.0  4.0 - 10.5 (K/uL)   RBC 3.87  3.87 - 5.11 (MIL/uL)   Hemoglobin 11.3 (*) 12.0 - 15.0 (g/dL)   HCT 91.4 (*) 78.2 - 46.0 (%)   MCV 91.5  78.0 - 100.0 (fL)   MCH 29.2  26.0 - 34.0 (pg)   MCHC 31.9  30.0 - 36.0 (g/dL)   RDW 95.6  21.3 - 08.6 (%)   Platelets 209  150 - 400 (K/uL)  RPR   Collection Time   12/31/11  3:31 AM      Component Value Range   RPR NON REACTIVE  NON REACTIVE     Imaging:  Not indicated  Assessment/Plan: Dysuria  D/C home with labor precautions Urine sent for culture F/U in office this week Return to MAU as needed  Medication List  As of 01/02/2012  6:55 AM   TAKE these medications         acetaminophen 500 MG tablet   Commonly known as: TYLENOL   Take 1,000 mg by mouth every 6 (six) hours as needed. For headache.      calcium carbonate 500 MG chewable tablet   Commonly known as: TUMS - dosed in mg elemental calcium   Chew 4 tablets by mouth daily as needed. Stomach acid      prenatal multivitamin Tabs   Take 1 tablet by mouth daily.               Gina Chung 4/5/20133:40 PM

## 2011-12-19 LAB — GC/CHLAMYDIA PROBE AMP, GENITAL: GC Probe Amp, Genital: NEGATIVE

## 2011-12-20 LAB — URINE CULTURE: Colony Count: 9000

## 2011-12-30 ENCOUNTER — Encounter (HOSPITAL_COMMUNITY): Payer: Self-pay | Admitting: *Deleted

## 2011-12-30 ENCOUNTER — Telehealth (HOSPITAL_COMMUNITY): Payer: Self-pay | Admitting: *Deleted

## 2011-12-30 NOTE — Telephone Encounter (Signed)
Preadmission screen  

## 2011-12-31 ENCOUNTER — Inpatient Hospital Stay (HOSPITAL_COMMUNITY)
Admission: AD | Admit: 2011-12-31 | Discharge: 2012-01-02 | DRG: 775 | Disposition: A | Discharge status: Home / Self Care | Payer: 59 | Source: Ambulatory Visit | Attending: Obstetrics & Gynecology | Admitting: Obstetrics & Gynecology

## 2011-12-31 ENCOUNTER — Encounter (HOSPITAL_COMMUNITY): Payer: Self-pay | Admitting: *Deleted

## 2011-12-31 ENCOUNTER — Encounter (HOSPITAL_COMMUNITY): Payer: Self-pay | Admitting: Anesthesiology

## 2011-12-31 ENCOUNTER — Inpatient Hospital Stay (HOSPITAL_COMMUNITY): Payer: 59 | Admitting: Anesthesiology

## 2011-12-31 LAB — CBC
HCT: 35.4 % — ABNORMAL LOW (ref 36.0–46.0)
Hemoglobin: 11.3 g/dL — ABNORMAL LOW (ref 12.0–15.0)
RBC: 3.87 MIL/uL (ref 3.87–5.11)
WBC: 7 10*3/uL (ref 4.0–10.5)

## 2011-12-31 LAB — RPR: RPR Ser Ql: NONREACTIVE

## 2011-12-31 MED ORDER — BENZOCAINE-MENTHOL 20-0.5 % EX AERO
1.0000 "application " | INHALATION_SPRAY | CUTANEOUS | Status: DC | PRN
  Administered 2011-12-31: 1 via TOPICAL

## 2011-12-31 MED ORDER — SENNOSIDES-DOCUSATE SODIUM 8.6-50 MG PO TABS
2.0000 | ORAL_TABLET | Freq: Every day | ORAL | Status: DC
  Administered 2011-12-31 – 2012-01-01 (×2): 2 via ORAL

## 2011-12-31 MED ORDER — METHYLERGONOVINE MALEATE 0.2 MG PO TABS: 0.2000 mg | ORAL_TABLET | ORAL | Status: DC | PRN

## 2011-12-31 MED ORDER — FENTANYL 2.5 MCG/ML BUPIVACAINE 1/10 % EPIDURAL INFUSION (WH - ANES)
14.0000 mL/h | INTRAMUSCULAR | Status: DC
  Filled 2011-12-31: qty 60

## 2011-12-31 MED ORDER — OXYTOCIN BOLUS FROM INFUSION
500.0000 mL | Freq: Once | INTRAVENOUS | Status: DC
  Filled 2011-12-31: qty 1000
  Filled 2011-12-31: qty 500

## 2011-12-31 MED ORDER — HYDROXYZINE HCL 50 MG PO TABS: 50.0000 mg | ORAL_TABLET | Freq: Four times a day (QID) | ORAL | Status: DC | PRN

## 2011-12-31 MED ORDER — PHENYLEPHRINE 40 MCG/ML (10ML) SYRINGE FOR IV PUSH (FOR BLOOD PRESSURE SUPPORT): 80.0000 ug | PREFILLED_SYRINGE | INTRAVENOUS | Status: DC | PRN

## 2011-12-31 MED ORDER — SIMETHICONE 80 MG PO CHEW: 80.0000 mg | CHEWABLE_TABLET | ORAL | Status: DC | PRN

## 2011-12-31 MED ORDER — METHYLERGONOVINE MALEATE 0.2 MG/ML IJ SOLN: 0.2000 mg | INTRAMUSCULAR | Status: DC | PRN

## 2011-12-31 MED ORDER — LANOLIN HYDROUS EX OINT: TOPICAL_OINTMENT | CUTANEOUS | Status: DC | PRN

## 2011-12-31 MED ORDER — FENTANYL 2.5 MCG/ML BUPIVACAINE 1/10 % EPIDURAL INFUSION (WH - ANES)
INTRAMUSCULAR | Status: DC | PRN
  Administered 2011-12-31: 14 mL/h via EPIDURAL

## 2011-12-31 MED ORDER — SODIUM BICARBONATE 8.4 % IV SOLN
INTRAVENOUS | Status: DC | PRN
  Administered 2011-12-31: 4 mL via EPIDURAL

## 2011-12-31 MED ORDER — LIDOCAINE HCL (PF) 1 % IJ SOLN: 30.0000 mL | INTRAMUSCULAR | Status: DC | PRN

## 2011-12-31 MED ORDER — DIPHENHYDRAMINE HCL 50 MG/ML IJ SOLN: 12.5000 mg | INTRAMUSCULAR | Status: DC | PRN

## 2011-12-31 MED ORDER — FLEET ENEMA 7-19 GM/118ML RE ENEM: 1.0000 | ENEMA | RECTAL | Status: DC | PRN

## 2011-12-31 MED ORDER — ACETAMINOPHEN 325 MG PO TABS: 650.0000 mg | ORAL_TABLET | ORAL | Status: DC | PRN

## 2011-12-31 MED ORDER — DIPHENHYDRAMINE HCL 25 MG PO CAPS: 25.0000 mg | ORAL_CAPSULE | Freq: Four times a day (QID) | ORAL | Status: DC | PRN

## 2011-12-31 MED ORDER — LACTATED RINGERS IV SOLN: 500.0000 mL | INTRAVENOUS | Status: DC | PRN

## 2011-12-31 MED ORDER — EPHEDRINE 5 MG/ML INJ
10.0000 mg | INTRAVENOUS | Status: DC | PRN
  Filled 2011-12-31: qty 4

## 2011-12-31 MED ORDER — PRENATAL MULTIVITAMIN CH
1.0000 | ORAL_TABLET | Freq: Every day | ORAL | Status: DC
  Administered 2011-12-31 – 2012-01-02 (×3): 1 via ORAL
  Filled 2011-12-31 (×3): qty 1

## 2011-12-31 MED ORDER — MEASLES, MUMPS & RUBELLA VAC ~~LOC~~ INJ
0.5000 mL | INJECTION | Freq: Once | SUBCUTANEOUS | Status: DC
  Filled 2011-12-31: qty 0.5

## 2011-12-31 MED ORDER — BENZOCAINE-MENTHOL 20-0.5 % EX AERO
INHALATION_SPRAY | CUTANEOUS | Status: AC
  Administered 2011-12-31: 1 via TOPICAL
  Filled 2011-12-31: qty 56

## 2011-12-31 MED ORDER — IBUPROFEN 600 MG PO TABS
600.0000 mg | ORAL_TABLET | Freq: Four times a day (QID) | ORAL | Status: DC
  Administered 2011-12-31 – 2012-01-02 (×9): 600 mg via ORAL
  Filled 2011-12-31 (×9): qty 1

## 2011-12-31 MED ORDER — OXYTOCIN 20 UNITS IN LACTATED RINGERS INFUSION - SIMPLE: 125.0000 mL/h | Freq: Once | INTRAVENOUS | Status: DC

## 2011-12-31 MED ORDER — ONDANSETRON HCL 4 MG/2ML IJ SOLN: 4.0000 mg | INTRAMUSCULAR | Status: DC | PRN

## 2011-12-31 MED ORDER — ZOLPIDEM TARTRATE 5 MG PO TABS: 5.0000 mg | ORAL_TABLET | Freq: Every evening | ORAL | Status: DC | PRN

## 2011-12-31 MED ORDER — TETANUS-DIPHTH-ACELL PERTUSSIS 5-2.5-18.5 LF-MCG/0.5 IM SUSP: 0.5000 mL | Freq: Once | INTRAMUSCULAR | Status: DC

## 2011-12-31 MED ORDER — OXYCODONE-ACETAMINOPHEN 5-325 MG PO TABS: 1.0000 | ORAL_TABLET | ORAL | Status: DC | PRN

## 2011-12-31 MED ORDER — PHENYLEPHRINE 40 MCG/ML (10ML) SYRINGE FOR IV PUSH (FOR BLOOD PRESSURE SUPPORT)
80.0000 ug | PREFILLED_SYRINGE | INTRAVENOUS | Status: DC | PRN
  Filled 2011-12-31: qty 5

## 2011-12-31 MED ORDER — ONDANSETRON HCL 4 MG PO TABS: 4.0000 mg | ORAL_TABLET | ORAL | Status: DC | PRN

## 2011-12-31 MED ORDER — WITCH HAZEL-GLYCERIN EX PADS: 1.0000 "application " | MEDICATED_PAD | CUTANEOUS | Status: DC | PRN

## 2011-12-31 MED ORDER — FERROUS SULFATE 325 (65 FE) MG PO TABS
325.0000 mg | ORAL_TABLET | Freq: Two times a day (BID) | ORAL | Status: DC
  Administered 2011-12-31 – 2012-01-02 (×5): 325 mg via ORAL
  Filled 2011-12-31 (×5): qty 1

## 2011-12-31 MED ORDER — ONDANSETRON HCL 4 MG/2ML IJ SOLN: 4.0000 mg | Freq: Four times a day (QID) | INTRAMUSCULAR | Status: DC | PRN

## 2011-12-31 MED ORDER — MAGNESIUM HYDROXIDE 400 MG/5ML PO SUSP: 30.0000 mL | ORAL | Status: DC | PRN

## 2011-12-31 MED ORDER — IBUPROFEN 600 MG PO TABS: 600.0000 mg | ORAL_TABLET | Freq: Four times a day (QID) | ORAL | Status: DC | PRN

## 2011-12-31 MED ORDER — OXYCODONE-ACETAMINOPHEN 5-325 MG PO TABS
1.0000 | ORAL_TABLET | ORAL | Status: DC | PRN
  Administered 2011-12-31 – 2012-01-01 (×4): 1 via ORAL
  Administered 2012-01-01: 2 via ORAL
  Administered 2012-01-02: 1 via ORAL
  Filled 2011-12-31: qty 2
  Filled 2011-12-31 (×5): qty 1

## 2011-12-31 MED ORDER — LACTATED RINGERS IV SOLN
INTRAVENOUS | Status: DC
  Administered 2011-12-31: 04:00:00 via INTRAVENOUS

## 2011-12-31 MED ORDER — OXYTOCIN 20 UNITS IN LACTATED RINGERS INFUSION - SIMPLE: 125.0000 mL/h | INTRAVENOUS | Status: DC | PRN

## 2011-12-31 MED ORDER — DIBUCAINE 1 % RE OINT
1.0000 "application " | TOPICAL_OINTMENT | RECTAL | Status: DC | PRN
  Administered 2011-12-31: 1 via RECTAL
  Filled 2011-12-31: qty 28

## 2011-12-31 MED ORDER — HYDROXYZINE HCL 50 MG/ML IM SOLN: 50.0000 mg | Freq: Four times a day (QID) | INTRAMUSCULAR | Status: DC | PRN

## 2011-12-31 MED ORDER — LACTATED RINGERS IV SOLN: 500.0000 mL | Freq: Once | INTRAVENOUS | Status: DC

## 2011-12-31 MED ORDER — EPHEDRINE 5 MG/ML INJ: 10.0000 mg | INTRAVENOUS | Status: DC | PRN

## 2011-12-31 MED ORDER — CITRIC ACID-SODIUM CITRATE 334-500 MG/5ML PO SOLN: 30.0000 mL | ORAL | Status: DC | PRN

## 2011-12-31 NOTE — Anesthesia Postprocedure Evaluation (Signed)
  Anesthesia Post-op Note  Patient: Gina Chung  Procedure(s) Performed: * No procedures listed *  Patient Location: PACU and Mother/Baby  Anesthesia Type: Epidural  Level of Consciousness: awake, alert , oriented, patient cooperative and responds to stimulation  Airway and Oxygen Therapy: Patient Spontanous Breathing  Post-op Pain: none  Post-op Assessment: Patient's Cardiovascular Status Stable, Respiratory Function Stable, No signs of Nausea or vomiting, Pain level controlled, No headache, No backache, No residual numbness and No residual motor weakness  Post-op Vital Signs: stable  Complications: No apparent anesthesia complications

## 2011-12-31 NOTE — Anesthesia Preprocedure Evaluation (Signed)

## 2011-12-31 NOTE — Anesthesia Procedure Notes (Signed)

## 2011-12-31 NOTE — H&P (Signed)
Gina Chung is a 36 y.o. female presenting with strong painful contractions. Maternal Medical History:  Reason for admission: Reason for admission: contractions and nausea.  Contractions: Onset was 3-5 hours ago.   Frequency: regular.   Perceived severity is strong.    Fetal activity: Perceived fetal activity is normal.   Last perceived fetal movement was within the past hour.    Prenatal complications: No bleeding, pre-eclampsia or substance abuse.     OB History    Grav Para Term Preterm Abortions TAB SAB Ect Mult Living   4 2 2  0 1 0 1 0 0 2     Past Medical History  Diagnosis Date  . Migraine   . History of UTI   . Language Barrier     arabic and english  . Anemia    Past Surgical History  Procedure Date  . Colposcopy    Family History: family history includes Hypertension in her father and Rheum arthritis in her mother. Social History:  reports that she has never smoked. She has never used smokeless tobacco. She reports that she does not drink alcohol or use illicit drugs.  Review of Systems  Constitutional: Negative for fever.  Eyes: Negative for blurred vision.  Respiratory: Negative for shortness of breath.   Cardiovascular: Negative for chest pain.  Gastrointestinal: Positive for nausea and vomiting. Negative for heartburn, abdominal pain, diarrhea, constipation and blood in stool.  Genitourinary: Negative for dysuria, urgency and frequency.  Skin: Negative for rash.  Neurological: Negative for sensory change and headaches.    Dilation: 8 Effacement (%): 100 Station: 0 Exam by:: Bennye Alm, RN Blood pressure 144/91, pulse 112, temperature 98.4 F (36.9 C), temperature source Oral, resp. rate 20, height 5\' 5"  (1.651 m), weight 79.379 kg (175 lb), last menstrual period 03/20/2011, SpO2 100.00%. Maternal Exam:  Uterine Assessment: Contraction strength is firm.  Contraction frequency is regular.   Abdomen: Fetal presentation: vertex  Pelvis: adequate for  delivery.   Cervix: Cervix evaluated by digital exam.     Physical Exam  Constitutional: She is oriented to person, place, and time. She appears well-developed and well-nourished.  HENT:  Head: Atraumatic.  Eyes: EOM are normal.  Neck: Normal range of motion.  Cardiovascular: Normal rate and regular rhythm.   Respiratory: Effort normal.  GI: There is no tenderness. There is no rebound and no guarding.  Genitourinary: Vagina normal and uterus normal.  Musculoskeletal: Normal range of motion.  Neurological: She is alert and oriented to person, place, and time. She has normal reflexes.  Skin: Skin is warm and dry.    Prenatal labs: ABO, Rh: O/Positive/-- (11/27 0000) Antibody: Negative (11/27 0000) Rubella: Immune (11/27 0000) RPR: Nonreactive (11/27 0000)  HBsAg: Negative (11/27 0000)  HIV: Non-reactive (11/27 0000)  GBS: Negative (03/19 0000)   Dilation: 8 Effacement (%): 100 Cervical Position: Middle Station: 0 Presentation: Vertex Exam by:: Bennye Alm, RN  Assessment/Plan: 36yo [redacted]w[redacted]d G4P2012 here in active labor. - Fetal/maternal monitoring - Epidural - Expectant management for SVD  [name] 12/31/2011, 4:10 AM

## 2011-12-31 NOTE — MAU Note (Signed)
Pt states she has been feeling pain since 1200

## 2012-01-01 ENCOUNTER — Other Ambulatory Visit: Payer: 59

## 2012-01-01 MED ORDER — IBUPROFEN 600 MG PO TABS: 600.0000 mg | ORAL_TABLET | Freq: Four times a day (QID) | ORAL | Status: AC | PRN

## 2012-01-01 NOTE — Discharge Summary (Addendum)
Obstetric Discharge Summary Reason for Admission: onset of labor Prenatal Procedures: NST and ultrasound Intrapartum Procedures: spontaneous vaginal delivery Postpartum Procedures: none Complications-Operative and Postpartum: none Hemoglobin  Date Value Range Status  12/31/2011 11.3* 12.0-15.0 (g/dL) Final     HCT  Date Value Range Status  12/31/2011 35.4* 36.0-46.0 (%) Final    Physical Exam:  General: alert, appears stated age and no distress Lochia: appropriate Uterine Fundus: firm Incision: n/a DVT Evaluation: No evidence of DVT seen on physical exam.  Discharge Diagnoses: Term Pregnancy-delivered  Discharge Information: Date: 01/01/2012 Activity: pelvic rest Diet: routine Medications: Ibuprofen Condition: stable Instructions: refer to practice specific booklet Discharge to: home Follow-up Information    Call [facility] [place]. (Follow up in 4-6 weeks for postpartum visit, call next week to schedule your appointment)    Contact information:   [address] [facility] [place] [address]          Newborn Data: Live born female  Birth Weight: 7 lb 15.3 oz (3610 g) APGAR: 8, 8  Home with mother.  SHORES,SUZANNE E. 01/01/2012, 6:54 AM  Baby was not released by pediatrician on PPD#1.  Mother being discharged today.  Exam unchanged from yesterday.  Candelaria Celeste JEHIEL 01/02/2012 7:39 AM

## 2012-01-01 NOTE — Discharge Summary (Signed)
Attestation of Attending Supervision of Advanced Practitioner: Evaluation and management procedures were performed by the [facility] Fellow/PA/CNM/NP under my supervision and collaboration. Chart reviewed, and agree with management and plan.  Jaynie Collins, M.D. 01/01/2012 2:14 PM

## 2012-01-01 NOTE — Progress Notes (Signed)
Post Partum Day 1 Subjective: no complaints and desires early discharge  Objective: Blood pressure 116/79, pulse 87, temperature 98.5 F (36.9 C), temperature source Oral, resp. rate 18, height 5\' 5"  (1.651 m), weight 175 lb (79.379 kg), last menstrual period 03/20/2011, SpO2 95.00%, unknown if currently breastfeeding.  Physical Exam:  General: alert, cooperative and no distress Lochia: appropriate Uterine Fundus: firm Incision: n/a DVT Evaluation: No evidence of DVT seen on physical exam.   Basename 12/31/11 0331  HGB 11.3*  HCT 35.4*    Assessment/Plan: Discharge home, Breastfeeding and Contraception natural family planning   LOS: 1 day   Jenella Craigie E. 01/01/2012, 6:50 AM

## 2012-01-01 NOTE — Discharge Instructions (Signed)

## 2012-01-04 ENCOUNTER — Inpatient Hospital Stay (HOSPITAL_COMMUNITY): Admission: RE | Admit: 2012-01-04 | Payer: 59 | Source: Ambulatory Visit

## 2012-07-16 ENCOUNTER — Emergency Department (INDEPENDENT_AMBULATORY_CARE_PROVIDER_SITE_OTHER)
Admission: EM | Admit: 2012-07-16 | Discharge: 2012-07-16 | Disposition: A | Discharge status: Home / Self Care | Payer: 59 | Source: Home / Self Care | Attending: Emergency Medicine | Admitting: Emergency Medicine

## 2012-07-16 ENCOUNTER — Encounter (HOSPITAL_COMMUNITY): Payer: Self-pay | Admitting: Emergency Medicine

## 2012-07-16 DIAGNOSIS — J020 Streptococcal pharyngitis: Secondary | ICD-10-CM

## 2012-07-16 LAB — POCT PREGNANCY, URINE: Preg Test, Ur: NEGATIVE

## 2012-07-16 LAB — POCT RAPID STREP A: Streptococcus, Group A Screen (Direct): POSITIVE — AB

## 2012-07-16 MED ORDER — AMOXICILLIN 500 MG PO CAPS: 500.0000 mg | ORAL_CAPSULE | Freq: Three times a day (TID) | ORAL | Status: AC

## 2012-07-16 MED ORDER — IBUPROFEN 600 MG PO TABS: 600.0000 mg | ORAL_TABLET | Freq: Four times a day (QID) | ORAL | Status: DC | PRN

## 2012-07-16 NOTE — ED Notes (Signed)
Pt c/o sore throat x 4 days and fever that started yesterday evening.   Did not check temp but felt feverish and had night sweats last night.  Pt states that her throat feels swollen and hurts with swallowing.   Pt has used tylenol and ibuprofen with no relief of pain.  Last dose of tylenol was this a.m at 6 a.m

## 2012-07-16 NOTE — ED Provider Notes (Signed)
History     CSN: [id number]  Arrival date & time 07/16/12  1031   First MD Initiated Contact with Patient 07/16/12 1032      Chief Complaint  Patient presents with  . Sore Throat    sore throat x 4 days. fever started yesterday evening.    (Consider location/radiation/quality/duration/timing/severity/associated sxs/prior treatment) HPI Comments: Patient presents urgent care complaining of a sore throat and fevers for 4 days. She feels no appetite and bodyaches and headache. She has been trying some Tylenol or Motrin for pain and fevers. Last dose was early this morning. It hurts to swallow. Denies any cough or shortness of breath nausea vomiting or abdominal pain  The history is provided by the patient.    Past Medical History  Diagnosis Date  . Migraine   . History of UTI   . Language barrier     arabic and english  . Anemia     Past Surgical History  Procedure Date  . Colposcopy     Family History  Problem Relation Age of Onset  . Rheum arthritis Mother   . Hypertension Father     History  Substance Use Topics  . Smoking status: Never Smoker   . Smokeless tobacco: Never Used  . Alcohol Use: No    OB History    Grav Para Term Preterm Abortions TAB SAB Ect Mult Living   4 3 3  0 1 0 1 0 0 3      Review of Systems  Constitutional: Positive for fever, activity change, appetite change and fatigue.  HENT: Positive for nosebleeds and congestion. Negative for neck pain, neck stiffness and postnasal drip.   Skin: Negative for rash and wound.  Neurological: Positive for headaches. Negative for dizziness.  Psychiatric/Behavioral: The patient is not nervous/anxious.     Allergies  Review of patient's allergies indicates no known allergies.  Home Medications   Current Outpatient Rx  Name Route Sig Dispense Refill  . ACETAMINOPHEN 500 MG PO TABS Oral Take 1,000 mg by mouth every 6 (six) hours as needed. For headache.    Marland Kitchen PRENATAL MULTIVITAMIN CH Oral Take 1  tablet by mouth daily.    . AMOXICILLIN 500 MG PO CAPS Oral Take 1 capsule (500 mg total) by mouth 3 (three) times daily. 21 capsule 0  . CALCIUM CARBONATE ANTACID 500 MG PO CHEW Oral Chew 4 tablets by mouth daily as needed. Stomach acid    . IBUPROFEN 600 MG PO TABS Oral Take 1 tablet (600 mg total) by mouth every 6 (six) hours as needed for pain. 15 tablet 0    BP 137/90  Pulse 92  Temp 98.8 F (37.1 C) (Oral)  Resp 17  SpO2 99%  LMP 06/13/2012  Breastfeeding? Yes  Physical Exam  Nursing note and vitals reviewed. Constitutional: She appears well-developed and well-nourished.  HENT:  Head: Normocephalic.  Mouth/Throat: Uvula is midline. Oropharyngeal exudate and posterior oropharyngeal erythema present. No tonsillar abscesses.  Eyes: Conjunctivae normal are normal. Right eye exhibits no discharge.  Neck: Neck supple.  Cardiovascular: Normal rate.   No murmur heard. Pulmonary/Chest: Effort normal and breath sounds normal.  Lymphadenopathy:    She has cervical adenopathy.  Skin: Skin is warm. No erythema.    ED Course  Procedures (including critical care time)  Labs Reviewed  POCT RAPID STREP A ([facility] ONLY) - Abnormal; Notable for the following:    Streptococcus, Group A Screen (Direct) POSITIVE (*)     All other  components within normal limits  POCT PREGNANCY, URINE   No results found.   1. Streptococcal pharyngitis       MDM   Patient with streptococcal uncomplicated pharyngitis. She has been started on amoxicillin. Encouraged to return if no improvement is noted after 3-5 days. She acknowledges and agrees with treatment plan and followup care as necessary.       Jimmie Molly, MD 07/16/12 1500

## 2012-09-14 ENCOUNTER — Emergency Department (INDEPENDENT_AMBULATORY_CARE_PROVIDER_SITE_OTHER)
Admission: EM | Admit: 2012-09-14 | Discharge: 2012-09-14 | Disposition: A | Discharge status: Home / Self Care | Payer: 59 | Source: Home / Self Care

## 2012-09-14 ENCOUNTER — Encounter (HOSPITAL_COMMUNITY): Payer: Self-pay | Admitting: Emergency Medicine

## 2012-09-14 DIAGNOSIS — J069 Acute upper respiratory infection, unspecified: Secondary | ICD-10-CM

## 2012-09-14 MED ORDER — PHENYLEPHRINE-CHLORPHEN-DM 10-4-12.5 MG/5ML PO LIQD: 5.0000 mL | ORAL | Status: DC | PRN

## 2012-09-14 NOTE — ED Provider Notes (Signed)
History     CSN: [id number]  Arrival date & time 09/14/12  1709   None     Chief Complaint  Patient presents with  . URI    (Consider location/radiation/quality/duration/timing/severity/associated sxs/prior treatment) HPI Comments: 37 year old female presents with cough and PND. She states earlier today she had temperature of 100. She denies shortness of breath, runny nose, earache, malaise or fatigue. She feels well otherwise.   Past Medical History  Diagnosis Date  . Migraine   . History of UTI   . Language barrier     arabic and english  . Anemia     Past Surgical History  Procedure Date  . Colposcopy     Family History  Problem Relation Age of Onset  . Rheum arthritis Mother   . Hypertension Father     History  Substance Use Topics  . Smoking status: Never Smoker   . Smokeless tobacco: Never Used  . Alcohol Use: No    OB History    Grav Para Term Preterm Abortions TAB SAB Ect Mult Living   4 3 3  0 1 0 1 0 0 3      Review of Systems  Constitutional: Positive for fever. Negative for chills, activity change, appetite change and fatigue.  HENT: Positive for postnasal drip. Negative for congestion, facial swelling, rhinorrhea, neck pain and neck stiffness.   Eyes: Negative.   Respiratory: Positive for cough.   Cardiovascular: Negative.   Gastrointestinal: Negative.   Skin: Negative for pallor and rash.  Neurological: Negative.     Allergies  Review of patient's allergies indicates no known allergies.  Home Medications   Current Outpatient Rx  Name  Route  Sig  Dispense  Refill  . ACETAMINOPHEN 500 MG PO TABS   Oral   Take 1,000 mg by mouth every 6 (six) hours as needed. For headache.         Marland Kitchen CALCIUM CARBONATE ANTACID 500 MG PO CHEW   Oral   Chew 4 tablets by mouth daily as needed. Stomach acid         . IBUPROFEN 600 MG PO TABS   Oral   Take 1 tablet (600 mg total) by mouth every 6 (six) hours as needed for pain.   15 tablet   0   . PRENATAL MULTIVITAMIN CH   Oral   Take 1 tablet by mouth daily.           BP 134/86  Pulse 98  Temp 98 F (36.7 C) (Oral)  Resp 19  SpO2 100%  Breastfeeding? Yes  Physical Exam  Constitutional: She is oriented to person, place, and time. She appears well-developed and well-nourished. No distress.  HENT:       TMs are normal bilaterally Mild erythema with clear PND in the oropharynx. No exudates.  Eyes: Conjunctivae normal and EOM are normal.  Neck: Normal range of motion. Neck supple.  Cardiovascular: Normal rate, regular rhythm and normal heart sounds.   Pulmonary/Chest: Effort normal and breath sounds normal. No respiratory distress. She has no wheezes. She has no rales.  Musculoskeletal: Normal range of motion. She exhibits no edema.  Lymphadenopathy:    She has no cervical adenopathy.  Neurological: She is alert and oriented to person, place, and time.  Skin: Skin is warm and dry. No rash noted.  Psychiatric: She has a normal mood and affect.    ED Course  Procedures (including critical care time)  Labs Reviewed - No data to display No  results found.   1. URI (upper respiratory infection)       MDM  Respiratory exam is normal, she is afebrile and stable. She is concerned that her cough is due to pneumonia since her husband had pneumonia a week ago. Her lungs are clear and there is no evidence of that at this time. She does have clear PND and I suspect she is coughing from that. Will treat with Norell CS 1 teaspoon q. 4-6 hours when necessary cough and congestion. History of pneumonia red flags and warning symptoms and return if necessary. For any increased fever, increased cough or shortness of breath or other symptoms but return .         Hayden Rasmussen, NP 09/14/12 1910

## 2012-09-14 NOTE — ED Notes (Addendum)
Pt c/o cold sx x1 week Sx include: cough w/green sputum, fevers, nauseas Denies: vomiting, diarrhea She is alert w/no signs of acute distress Has taken OTC cold meds.  Mom is breastfeeding.

## 2012-09-14 NOTE — L&D Delivery Note (Signed)
Delivery Note At 9:48 PM a viable female was delivered via  (Presentation: ;  ).  APGAR: , ; weight .   Placenta status: , .  Cord:  with the following complications: .  Cord pH: not done  Anesthesia: Epidural  Episiotomy:  Lacerations:  Suture Repair: 2.0 Est. Blood Loss (mL):   Mom to postpartum.  Baby to nursery-stable.  Gina Chung A 06/08/2013, 9:54 PM

## 2012-09-15 NOTE — ED Notes (Signed)
Per Dr. Artis Flock advised pt she could take delsym for cough due to the fact that she is breast feeding

## 2012-09-15 NOTE — ED Provider Notes (Signed)
Medical screening examination/treatment/procedure(s) were performed by resident physician or non-physician practitioner and as supervising physician I was immediately available for consultation/collaboration.   KINDL,JAMES DOUGLAS MD.    James D Kindl, MD 09/15/12 2026 

## 2012-09-20 ENCOUNTER — Ambulatory Visit (INDEPENDENT_AMBULATORY_CARE_PROVIDER_SITE_OTHER): Payer: 59 | Admitting: Family Medicine

## 2012-09-20 ENCOUNTER — Telehealth: Payer: Self-pay

## 2012-09-20 ENCOUNTER — Ambulatory Visit: Payer: 59

## 2012-09-20 VITALS — BP 133/82 | HR 91 | Temp 98.3°F | Resp 16 | Ht 62.0 in | Wt 188.0 lb

## 2012-09-20 DIAGNOSIS — R05 Cough: Secondary | ICD-10-CM

## 2012-09-20 DIAGNOSIS — J189 Pneumonia, unspecified organism: Secondary | ICD-10-CM

## 2012-09-20 DIAGNOSIS — R059 Cough, unspecified: Secondary | ICD-10-CM

## 2012-09-20 LAB — POCT CBC
Granulocyte percent: 39.4 %G (ref 37–80)
Lymph, poc: 4 — AB (ref 0.6–3.4)
MCHC: 31.7 g/dL — AB (ref 31.8–35.4)
MCV: 89.1 fL (ref 80–97)
MID (cbc): 0.5 (ref 0–0.9)
POC Granulocyte: 2.9 (ref 2–6.9)
POC LYMPH PERCENT: 53.7 %L — AB (ref 10–50)
Platelet Count, POC: 392 10*3/uL (ref 142–424)
RDW, POC: 16.7 %

## 2012-09-20 MED ORDER — GUAIFENESIN ER 1200 MG PO TB12: 1.0000 | ORAL_TABLET | Freq: Two times a day (BID) | ORAL | Status: DC

## 2012-09-20 MED ORDER — HYDROCODONE-ACETAMINOPHEN 7.5-500 MG/15ML PO SOLN: 5.0000 mL | Freq: Three times a day (TID) | ORAL | Status: DC | PRN

## 2012-09-20 MED ORDER — CEFDINIR 300 MG PO CAPS: 300.0000 mg | ORAL_CAPSULE | Freq: Two times a day (BID) | ORAL | Status: AC

## 2012-09-20 NOTE — Telephone Encounter (Signed)
[facility] on [address] called and needs to talk with sarah about changing an rx for lortab, due to availability Please call travis @ [phone]

## 2012-09-20 NOTE — Telephone Encounter (Signed)
Sarah spoke with pharmacist and changed the RX.

## 2012-09-20 NOTE — Progress Notes (Signed)
   [address] [place] [address]   Phone [phone]  Subjective:    Patient ID: Gina Chung, female    DOB: 1976-01-30, 37 y.o.   MRN: [id number]  HPI  Pt presents to clinic with 3 wks h/o cough and cold symptoms. She started 3 wks ago with a cough with green sputum.  She has continued with the cough and then this week she started to have other cold symptoms like sinus congestion and sore throat with low grade fever and myalgias.  It took her a while to get here because her 50 month old daughter had pneumonia last week and she was taking care of her.  She is breastfeeding and has taken no OTC medications other than tylenol and motrin.  She has no h/o asthma and is feeling chest tightness and SOB only with coughing.  Cough is worse at night.  3 weeks ago she was normal without any symptoms.  Review of Systems  Constitutional: Positive for fever and chills.  HENT: Positive for congestion, sore throat, rhinorrhea, postnasal drip and sinus pressure.   Respiratory: Positive for cough and chest tightness. Negative for shortness of breath and wheezing.        Objective:   Physical Exam  Vitals reviewed. Constitutional: She appears well-developed and well-nourished.  HENT:  Head: Normocephalic and atraumatic.  Right Ear: Hearing, tympanic membrane, external ear and ear canal normal.  Left Ear: Hearing, tympanic membrane, external ear and ear canal normal.  Nose: Nose normal.  Mouth/Throat: Uvula is midline, oropharynx is clear and moist and mucous membranes are normal.  Eyes: Conjunctivae normal are normal.  Neck: Neck supple.  Cardiovascular: Normal rate, regular rhythm and normal heart sounds.   Pulmonary/Chest: Effort normal. She has no decreased breath sounds. She has wheezes in the right middle field and the right lower field. She has no rhonchi. She has rales (right sided).       Coarse breath sounds on the right.  Lymphadenopathy:    She has no cervical adenopathy.  Skin: Skin  is warm and dry.  Psychiatric: She has a normal mood and affect. Her behavior is normal. Judgment and thought content normal.     [facility] reading (PRIMARY) by  Dr. Conley Rolls. Increased interstitial markings.  ? RML infiltrate more visible on the lateral view.       Assessment & Plan:   1. Cough  POCT CBC, DG Chest 2 View, Guaifenesin (MUCINEX MAXIMUM STRENGTH) 1200 MG TB12, HYDROcodone-acetaminophen (LORTAB) 7.5-500 MG/15ML solution  2. Pneumonia  cefdinir (OMNICEF) 300 MG capsule   Pt to push fluids.  D/w pt to monitor baby for drowsiness and to take minimal doses of Lortab.  Baby is eating quite a bit of table food so this should be less of a problem than if baby was only nursing for food.  Pt will push fluids to decrease problems with milk production.

## 2012-10-13 LAB — SICKLE CELL SCREEN: Sickle Cell Screen: NEGATIVE

## 2012-10-13 LAB — OB RESULTS CONSOLE HIV ANTIBODY (ROUTINE TESTING): HIV: NONREACTIVE

## 2012-10-13 LAB — OB RESULTS CONSOLE HGB/HCT, BLOOD: HCT: 36 %

## 2012-10-13 LAB — OB RESULTS CONSOLE RPR: RPR: NONREACTIVE

## 2012-10-13 LAB — OB RESULTS CONSOLE PLATELET COUNT: Platelets: 396 10*3/uL

## 2012-10-21 ENCOUNTER — Other Ambulatory Visit: Payer: Self-pay | Admitting: Obstetrics & Gynecology

## 2012-10-21 DIAGNOSIS — O3680X0 Pregnancy with inconclusive fetal viability, not applicable or unspecified: Secondary | ICD-10-CM

## 2012-10-25 ENCOUNTER — Ambulatory Visit (HOSPITAL_COMMUNITY)
Admission: RE | Admit: 2012-10-25 | Discharge: 2012-10-25 | Disposition: A | Discharge status: Home / Self Care | Payer: 59 | Source: Ambulatory Visit | Attending: Obstetrics & Gynecology | Admitting: Obstetrics & Gynecology

## 2012-10-25 DIAGNOSIS — O3680X0 Pregnancy with inconclusive fetal viability, not applicable or unspecified: Secondary | ICD-10-CM | POA: Insufficient documentation

## 2012-12-08 ENCOUNTER — Encounter: Payer: Self-pay | Admitting: Obstetrics & Gynecology

## 2012-12-08 ENCOUNTER — Ambulatory Visit (INDEPENDENT_AMBULATORY_CARE_PROVIDER_SITE_OTHER): Payer: 59 | Admitting: Obstetrics & Gynecology

## 2012-12-08 VITALS — BP 129/83 | Temp 98.1°F | Wt 183.0 lb

## 2012-12-08 DIAGNOSIS — Z3480 Encounter for supervision of other normal pregnancy, unspecified trimester: Secondary | ICD-10-CM

## 2012-12-08 DIAGNOSIS — Z3482 Encounter for supervision of other normal pregnancy, second trimester: Secondary | ICD-10-CM

## 2012-12-08 LAB — POCT URINALYSIS DIPSTICK
Spec Grav, UA: 1.02
Urobilinogen, UA: NEGATIVE
pH, UA: 6

## 2012-12-08 NOTE — Patient Instructions (Signed)
Patient information: Should I have a screening test for Down syndrome during pregnancy? (Beyond the Basics)  Authors Barbarann Ehlers, MS Sharlette Dense, PhD Lavonna Monarch, PhD Section Editor Knox Saliva, MD, PhD Deputy Editor Raye Sorrow, MD Disclosures  All topics are updated as new evidence becomes available and our peer review process is complete.  Literature review current through: Feb 2014.  This topic last updated: Jul 19, 2012.  INTRODUCTION - This topic provides information about prenatal screening for Down syndrome to help you decide if you want to undergo this test. More detailed information about Down syndrome screening tests is available by subscription. (See "Down syndrome: Prenatal screening overview".) WHAT IS DOWN SYNDROME? - The first decision you need to make is if you want to know, before it is born, whether your developing baby has Down syndrome. It may help to review some facts about the condition. More detailed information is available separately. (See "Patient information: Down syndrome (Beyond the Basics)".) ?Down syndrome is caused by an extra number 21 chromosome ?It occurs in about 1 in 700 births and is more common earlier in pregnancy ?People with Down syndrome have mild to moderate intellectual disability (mental retardation), meaning that the person can often do things independently; however, most need supervision throughout their lives. ?People with Down syndrome have characteristic facial features (picture 1), meaning that their facial features are similar to those of other people with Down syndrome ?People with Down syndrome may have birth defects, such as problems with how the heart or intestines develop. Other medical problems can also develop. ?The average lifespan for an individual with Down syndrome is about 50 to 60 years Could my baby have Down syndrome? ?A woman of any age can have a baby with Down syndrome, but the chance gets  higher as a woman gets older. ?Down syndrome usually does not run in families, except in rare cases. You should inform your doctor or nurse if you or your partner has a family member with Down syndrome. WHAT INFORMATION DOES A PRENATAL SCREENING TEST FOR DOWN SYNDROME PROVIDE? - A screening test will tell you the chances of having a certain medical condition. Screening tests for Down syndrome cannot tell for certain whether your baby actually has Down syndrome; rather, they tell you whether there is a low or high risk that the baby is affected. By comparison, a diagnostic test can tell for certain if the baby has Down syndrome. The advantage of Down syndrome screening tests is that they only require a blood test from the mother, and possibly an ultrasound, so there is no risk to the pregnancy. The diagnostic tests for Down syndrome require putting a needle into the uterus or placenta and removing some fluid or tissue. There is a small risk of miscarriage (about 1/200 for chorionic villus sampling [CVS] and 1/300 to 1/600 for amniocentesis) after a diagnostic test. The decision to have a prenatal screening test for Down syndrome is yours and depends upon your wishes, values, and beliefs. There is no right or wrong choice; you decide what is best for you and your family. Some couples who have a positive test decide against having a diagnostic test and some decide to continue the pregnancy even when Down syndrome is diagnosed. WHO IS OFFERED A SCREENING TEST FOR DOWN SYNDROME? - The [employer] recommends that all pregnant women, regardless of age, be offered the opportunity to have a screening test for Down syndrome before 20 weeks  of pregnancy. Screening tests for Down syndrome are voluntary, meaning that it is your choice whether to have or not have these tests. DECIDING TO HAVE A SCREENING TEST FOR DOWN SYNDROME Why should I have a screening test? - These are some of  the reasons that women choose to have screening for Down syndrome: ?I want as much information as possible during pregnancy about the health of my developing baby. ?If my baby has Down syndrome, I want to know while I am pregnant so I can learn as much as possible about the condition before the baby is born. ?I am planning to deliver my baby in a community hospital, so if my baby has serious birth defects associated with Down syndrome (eg, heart or intestinal abnormalities), I would rather deliver at a hospital with a special care nursery. ?I have been anxious since I learned I was pregnant and if I find out that my baby's risk of having Down syndrome is low, I believe it will help ease my anxiety. ?I want to consider all of my options. If my developing baby has Down syndrome, I would want the option to terminate the pregnancy. ?I am not sure what I would do, or how I would feel, if my baby has Down syndrome. I am going to take it one step at a time. If my screening test comes back saying I am at increased risk, I will decide at that time if I want to have more testing. Why might I choose not to have a screening test? - These are some of the reasons that a woman might choose NOT to have screening: ?I have decided that "whatever will be, will be," and I will wait until the baby's birth to find out if the baby is healthy. ?I do not want to be faced with decisions about my unborn baby. Because of religious or personal beliefs, I would never consider terminating an affected pregnancy. ?Since I know I would never have a diagnostic test, even with only a small risk of a miscarriage, I do not want to have a screening test. ?I want to know for sure if the developing baby has Down syndrome, so I am having a diagnostic test (eg, chorionic villus sampling [CVS] or amniocentesis) rather than a screening test. (See "Patient information: Chorionic villus sampling (Beyond the Basics)" and "Patient information:  Amniocentesis (Beyond the Basics)".) Some common myths about screening for Down syndrome - Some of the reasons women decide whether or not to have screening are based on incorrect information, such as: ?Myth - My baby won't have Down syndrome because I am young, I exercise, and I am healthy. ?Fact - A woman of any age can have a baby with Down syndrome, regardless of her health. ?Myth - My baby won't have Down syndrome because I do not drink or smoke. ?Fact - Avoiding alcohol or tobacco during pregnancy is very important for the health of you and your baby; however, it does not affect the chance that your baby will have Down syndrome. ?Myth - My baby won't have Down syndrome because no one in my family or the father of the baby's family has Down syndrome. ?Fact - Down syndrome usually does not run in families. Your baby can be affected even if there is no one else in the family with Down syndrome. If you have a family history of Down syndrome, you should talk to your doctor, nurse, or a genetic counselor to discuss if it will increase your  risk of having a baby with Down syndrome. ?Myth - I should not have screening for Down syndrome unless I know that I would terminate the pregnancy if Down syndrome were detected. ?Fact - Many people who would not terminate their pregnancy choose to have screening. These people want information about their unborn baby's health before birth to plan for delivery and newborn care. ?Myth - My friend told me that if I have a screening test, it will come back "positive" since most people who have the test end up with a "positive" result. ?Fact - Most people who have a screening test will have a "negative" result, meaning that the baby has a low risk of having Down syndrome. WHAT DOWN SYNDROME SCREENING TESTS ARE AVAILABLE? - There are several different screening tests available. Some important considerations include the following: ?How far along in pregnancy are you? ?What  screening tests are available in your area? ?What, if any, diagnostic tests (chorionic villus sampling [CVS] or amniocentesis) are available in your area? There are four basic types of screening tests for Down syndrome. Some of these tests need to be done early in the pregnancy, while one is not done until 15 to 18 weeks of pregnancy (at around 4 months). ?First-trimester screening is typically done at 11 to 13 weeks of pregnancy. It involves a test of your blood and an ultrasound of the developing baby. ?Second-trimester screening is typically done at 15 to 18 weeks of pregnancy. The test only requires a sample of your blood. ?Integrated screening combines results from tests done during the first and second trimesters. These tests involve two samples of your blood, and often include an ultrasound of the developing baby. Results are usually available in the second trimester. In some variations of this test, called sequential screening or contingent screening, results are available earlier if you are at very high or very low risk of having a baby with Down syndrome. ?A newest screening method is the measurement of circulating cell free DNA in maternal plasma, which can be done beginning at 10 weeks of gestation. This test only requires a sample of your blood. It is currently recommended for women at high risk of having a Down syndrome pregnancy and can markedly reduce the need for invasive diagnostic testing (amniocentesis, CVS). It may not be paid for by your health insurance. Which screening test should I choose? - The "best" screening test depends upon your values and preferences. You can use the following statements to help guide your decision (table 1). TEST RESULTS - Your risk of having a baby with Down syndrome is based on your age and the results of the blood test and ultrasound measurement. It takes about one week to get results. For most tests, the results will be given as a number. For example, a  woman with a result of 1 in 2000 would have a "low" risk that the baby is affected. A woman with a result of 1 in 50 would be considered at "high" risk. There is no screening result that will tell for sure if the developing baby definitely does or does not have Down syndrome. Screen positive results - If your test shows a "high" risk of having a baby with Down syndrome, you can choose to have: ?Further (diagnostic) testing, if you want to know for sure if your baby is affected. It will take about 2 to 3 weeks to schedule, perform, and get the results. ?No further testing during pregnancy. If needed, the infant can  be tested after birth. To help you with your decision, consider meeting with a genetic counselor. He or she can help you balance the risks and benefits of diagnostic testing. Talking with a counselor can also help you think about the issues involved in ending a pregnancy or raising a child with Down syndrome. Two diagnostic tests are available: ?Chorionic villus sampling (CVS) - CVS is the test that would be done if you were in the first trimester of pregnancy. The test is performed between 10 and 13 weeks of pregnancy and has a small risk of miscarriage (about 1 miscarriage for every 100 procedures). (See "Patient information: Chorionic villus sampling (Beyond the Basics)".) ?Amniocentesis - Amniocentesis is the test that would be done if you were in your second trimester (after 14 weeks of pregnancy). Amniocentesis is thought to have a smaller risk of miscarriage (less than 1 miscarriage for every 200 procedures) compared with CVS. (See "Patient information: Amniocentesis (Beyond the Basics)".) WHERE TO GET MORE INFORMATION - Your healthcare provider is the best source of information for questions and concerns related to your medical problem. This article will be updated as needed on our web site ([email or web address]). Related topics for patients, as well as selected articles written for  healthcare professionals, are also available. Some of the most relevant are listed below. Patient level information - UpToDate offers two types of patient education materials. The Basics - The Basics patient education pieces answer the four or five key questions a patient might have about a given condition. These articles are best for patients who want a general overview and who prefer short, easy-to-read materials.

## 2012-12-08 NOTE — Progress Notes (Signed)
Likely candida vulvovaginitis. 

## 2012-12-08 NOTE — Progress Notes (Signed)
Pt states she has had some vaginal discharge with itching.

## 2013-01-04 ENCOUNTER — Encounter: Payer: Self-pay | Admitting: Obstetrics & Gynecology

## 2013-01-04 ENCOUNTER — Ambulatory Visit (INDEPENDENT_AMBULATORY_CARE_PROVIDER_SITE_OTHER): Payer: 59 | Admitting: Obstetrics & Gynecology

## 2013-01-04 ENCOUNTER — Ambulatory Visit (INDEPENDENT_AMBULATORY_CARE_PROVIDER_SITE_OTHER): Payer: 59

## 2013-01-04 VITALS — BP 127/78 | Temp 96.7°F | Wt 178.0 lb

## 2013-01-04 DIAGNOSIS — Z3482 Encounter for supervision of other normal pregnancy, second trimester: Secondary | ICD-10-CM

## 2013-01-04 DIAGNOSIS — O09529 Supervision of elderly multigravida, unspecified trimester: Secondary | ICD-10-CM

## 2013-01-04 DIAGNOSIS — Z3480 Encounter for supervision of other normal pregnancy, unspecified trimester: Secondary | ICD-10-CM

## 2013-01-04 LAB — POCT URINALYSIS DIPSTICK
Blood, UA: NEGATIVE
Ketones, UA: NEGATIVE
Leukocytes, UA: NEGATIVE
Protein, UA: NEGATIVE
Spec Grav, UA: <=1.005
pH, UA: 7

## 2013-01-04 LAB — US OB DETAIL + 14 WK

## 2013-01-04 NOTE — Progress Notes (Signed)
Pulse-87 No complaints.

## 2013-01-04 NOTE — Patient Instructions (Addendum)
AFP Maternal This is a routine screen (tests) used to check for fetal abnormalities such as Down syndrome and neural tube defects. Down Syndrome is a chromosomal abnormality, sometimes called Trisomy 28. Neural tube defects are serious birth defects. The brain, spinal cord, or their coverings do not develop completely. Women should be tested in the 15th to 20th week of pregnancy. The msAFP screen involves three or four tests that measure substances found in the blood that make the testing better. During development, AFP levels in fetal blood and amniotic fluid rise until about 12 weeks. The levels then gradually fall until birth. AFP is a protein produce by fetal tissue. AFP crosses the placenta and appears in the maternal blood. A baby with an open neural tube defect has an opening in its spine, head, or abdominal wall that allows higher-than-usual amounts of AFP to pass into the mother's blood. If a screen is positive, more tests are needed to make a diagnosis. These include ultrasound and perhaps amniocentesis (checking the fluid that surrounds the baby). These tests are used to help women and their caregivers make decisions about the management of their pregnancies. In pregnancies where the fetus is carrying the chromosomal defect that results in Down syndrome, the levels of AFP and unconjugated estriol tend to be low and hCG and inhibin A levels high.  PREPARATION FOR TEST Blood is drawn from a vein in your arm usually between the 15th and 20th weeks of pregnancy. Four different tests on your blood are done. These are AFP, hCG, unconjugated estriol, and inhibin A. The combination of tests produces a more accurate result. NORMAL FINDINGS   Adult: less than 40ng/mL or less than 40 mg/L (SI units)  Child younger than1 year: less than 30 ng/mL Ranges are stratified by weeks of gestation and vary among laboratories. Ranges for normal findings may vary among different laboratories and hospitals. You  should always check with your doctor after having lab work or other tests done to discuss the meaning of your test results and whether your values are considered within normal limits. MEANING OF TEST  These are screening tests. Not all fetal abnormalities will give positive test results. Of all women who have positive AFP screening results, only a very small number of them have babies who actually have a neural tube defect or chromosomal abnormality. Your caregiver will go over the test results with you and discuss the importance and meaning of your results, as well as treatment options and the need for additional tests if necessary. OBTAINING THE TEST RESULTS It is your responsibility to obtain your test results. Ask the lab or department performing the test when and how you will get your results. Document Released: 09/22/2004 Document Revised: 11/23/2011 Document Reviewed: 08/04/2008 [facility] Patient Information 2013 [place], [place]. Glucose Tolerance Test This is a test to see how your body processes carbohydrates. This test is often done to check patients for diabetes or the possibility of developing it. PREPARATION FOR TEST You should have nothing to eat or drink 12 hours before the test. You will be given a form of sugar (glucose) and then blood samples will be drawn from your vein to determine the level of sugar in your blood. Alternatively, blood may be drawn from your finger for testing. You should not smoke or exercise during the test. NORMAL FINDINGS  Fasting: 70-115 mg/dL  30 minutes: less than 200 mg/dL  1 hour: less than 161 mg/dL  2 hours: less than 096 mg/dL  3 hours:  70-115 mg/dL  4 hours: 16-109 mg/dL Ranges for normal findings may vary among different laboratories and hospitals. You should always check with your doctor after having lab work or other tests done to discuss the meaning of your test results and whether your values are considered within normal limits. MEANING  OF TEST Your caregiver will go over the test results with you and discuss the importance and meaning of your results, as well as treatment options and the need for additional tests. OBTAINING THE TEST RESULTS It is your responsibility to obtain your test results. Ask the lab or department performing the test when and how you will get your results. Document Released: 09/23/2004 Document Revised: 11/23/2011 Document Reviewed: 08/11/2008 [facility] Patient Information 2013 [place], [place].

## 2013-01-04 NOTE — Progress Notes (Signed)
Doing well 

## 2013-01-05 ENCOUNTER — Encounter: Payer: Self-pay | Admitting: Obstetrics & Gynecology

## 2013-01-05 LAB — US OB DETAIL + 14 WK

## 2013-01-06 ENCOUNTER — Encounter: Payer: Self-pay | Admitting: Obstetrics & Gynecology

## 2013-01-06 LAB — AFP, QUAD SCREEN
Age Alone: 1:321 {titer}
Curr Gest Age: 18.4 wks.days
Down Syndrome Scr Risk Est: 1:4990 {titer}
INH: 160.9 pg/mL
MoM for INH: 0.98
MoM for hCG: 0.67
Osb Risk: 1:23000 {titer}
Tri 18 Scr Risk Est: NEGATIVE
Trisomy 18 (Edward) Syndrome Interp.: 1:15100 {titer}

## 2013-01-09 LAB — CYSTIC FIBROSIS DIAGNOSTIC STUDY

## 2013-01-10 ENCOUNTER — Encounter: Payer: Self-pay | Admitting: *Deleted

## 2013-02-15 ENCOUNTER — Encounter: Payer: Self-pay | Admitting: Obstetrics & Gynecology

## 2013-02-15 ENCOUNTER — Ambulatory Visit (INDEPENDENT_AMBULATORY_CARE_PROVIDER_SITE_OTHER): Payer: 59 | Admitting: Obstetrics & Gynecology

## 2013-02-15 VITALS — BP 124/82 | Temp 98.0°F | Wt 186.6 lb

## 2013-02-15 DIAGNOSIS — R000 Tachycardia, unspecified: Secondary | ICD-10-CM

## 2013-02-15 DIAGNOSIS — Z3480 Encounter for supervision of other normal pregnancy, unspecified trimester: Secondary | ICD-10-CM

## 2013-02-15 DIAGNOSIS — Z3482 Encounter for supervision of other normal pregnancy, second trimester: Secondary | ICD-10-CM

## 2013-02-15 LAB — BASIC METABOLIC PANEL
BUN: 6 mg/dL (ref 6–23)
Calcium: 8.5 mg/dL (ref 8.4–10.5)
Glucose, Bld: 90 mg/dL (ref 70–99)
Potassium: 3.7 mEq/L (ref 3.5–5.3)

## 2013-02-15 LAB — POCT URINALYSIS DIPSTICK
Blood, UA: NEGATIVE
Glucose, UA: NEGATIVE
Nitrite, UA: NEGATIVE
Protein, UA: NEGATIVE
Spec Grav, UA: 1.015
Urobilinogen, UA: NEGATIVE

## 2013-02-15 NOTE — Patient Instructions (Signed)
Glucose Tolerance Test This is a test to see how your body processes carbohydrates. This test is often done to check patients for diabetes or the possibility of developing it. PREPARATION FOR TEST You should have nothing to eat or drink 12 hours before the test. You will be given a form of sugar (glucose) and then blood samples will be drawn from your vein to determine the level of sugar in your blood. Alternatively, blood may be drawn from your finger for testing. You should not smoke or exercise during the test. NORMAL FINDINGS  Fasting: 70-115 mg/dL  30 minutes: less than 200 mg/dL  1 hour: less than 200 mg/dL  2 hours: less than 140 mg/dL  3 hours: 70-115 mg/dL  4 hours: 70-115 mg/dL Ranges for normal findings may vary among different laboratories and hospitals. You should always check with your doctor after having lab work or other tests done to discuss the meaning of your test results and whether your values are considered within normal limits. MEANING OF TEST Your caregiver will go over the test results with you and discuss the importance and meaning of your results, as well as treatment options and the need for additional tests. OBTAINING THE TEST RESULTS It is your responsibility to obtain your test results. Ask the lab or department performing the test when and how you will get your results. Document Released: 09/23/2004 Document Revised: 11/23/2011 Document Reviewed: 08/11/2008 [employer] Patient Information 2014 [employer].  

## 2013-02-15 NOTE — Progress Notes (Signed)
Pulse: 123

## 2013-02-15 NOTE — Progress Notes (Signed)
Plans Nexplanon.  C/O occasional palpitations.  EKG--mild sinus tachycardia--ventricular rate low 100s.  O2 sat OK.

## 2013-02-16 LAB — TSH: TSH: 1.484 u[IU]/mL (ref 0.350–4.500)

## 2013-02-16 LAB — T4, FREE: Free T4: 0.9 ng/dL (ref 0.80–1.80)

## 2013-02-23 ENCOUNTER — Encounter: Payer: Self-pay | Admitting: Obstetrics & Gynecology

## 2013-03-01 ENCOUNTER — Other Ambulatory Visit: Payer: 59

## 2013-03-01 ENCOUNTER — Encounter: Payer: Self-pay | Admitting: Obstetrics & Gynecology

## 2013-03-01 ENCOUNTER — Ambulatory Visit (INDEPENDENT_AMBULATORY_CARE_PROVIDER_SITE_OTHER): Payer: 59 | Admitting: Obstetrics & Gynecology

## 2013-03-01 VITALS — BP 115/78 | Temp 98.0°F | Wt 187.2 lb

## 2013-03-01 DIAGNOSIS — Z23 Encounter for immunization: Secondary | ICD-10-CM

## 2013-03-01 DIAGNOSIS — Z3480 Encounter for supervision of other normal pregnancy, unspecified trimester: Secondary | ICD-10-CM

## 2013-03-01 DIAGNOSIS — Z3482 Encounter for supervision of other normal pregnancy, second trimester: Secondary | ICD-10-CM

## 2013-03-01 LAB — CBC
MCH: 30.1 pg (ref 26.0–34.0)
MCHC: 33.5 g/dL (ref 30.0–36.0)
Platelets: 275 10*3/uL (ref 150–400)
RDW: 14.5 % (ref 11.5–15.5)

## 2013-03-01 LAB — POCT URINALYSIS DIPSTICK
Bilirubin, UA: NEGATIVE
Blood, UA: NEGATIVE
Glucose, UA: NEGATIVE
Ketones, UA: NEGATIVE
Nitrite, UA: NEGATIVE
Spec Grav, UA: 1.02
pH, UA: 5.5

## 2013-03-01 NOTE — Patient Instructions (Signed)
Pregnancy - Second Trimester The second trimester is the period between 13 to 27 weeks of your pregnancy. It is important to follow your doctor's instructions. HOME CARE   Do not smoke.  Do not drink alcohol or use drugs.  Only take medicine as told by your doctor.  Take prenatal vitamins as told. The vitamin should contain 1 milligram of folic acid.  Exercise.  Eat healthy foods. Eat regular, well-balanced meals.  You can have sex (intercourse) if there are no other problems with the pregnancy.  Do not use hot tubs, steam rooms, or saunas.  Wear a seat belt while driving.  Avoid raw meat, uncooked cheese, and litter boxes and soil used by cats.  Visit your dentist. Cleanings are okay. GET HELP RIGHT AWAY IF:   You have a temperature by mouth above 102 F (38.9 C), not controlled by medicine.  Fluid is coming from your vagina.  Blood is coming from your vagina. Light spotting is common, especially after sex (intercourse).  You have a bad smelling fluid (discharge) coming from the vagina. The fluid changes from clear to white.  You still feel sick to your stomach (nauseous).  You throw up (vomit) blood.  You lose or gain more than 2 pounds (0.9 kilograms) of weight in a week, or as suggested by your doctor.  Your face, hands, feet, or legs get puffy (swell).  You get exposed to German measles and have never had them.  You get exposed to fifth disease or chickenpox.  You have belly (abdominal) pain.  You have a bad headache that will not go away.  You have watery poop (diarrhea), pain when you pee (urinate), or have shortness of breath.  You start to have problems seeing (blurry or double vision).  You fall, are in a car accident, or have any kind of trauma.  There is mental or physical violence at home.  You have any concerns or worries during your pregnancy. MAKE SURE YOU:   Understand these instructions.  Will watch your condition.  Will get help  right away if you are not doing well or get worse. Document Released: 11/25/2009 Document Revised: 11/23/2011 Document Reviewed: 11/25/2009 [employer] Patient Information 2014 [employer].  

## 2013-03-01 NOTE — Progress Notes (Signed)
Pulse 111  

## 2013-03-01 NOTE — Progress Notes (Signed)
Doing well 

## 2013-03-02 LAB — GLUCOSE TOLERANCE, 2 HOURS W/ 1HR
Glucose, 1 hour: 176 mg/dL — ABNORMAL HIGH (ref 70–170)
Glucose, Fasting: 70 mg/dL (ref 70–99)

## 2013-03-29 ENCOUNTER — Ambulatory Visit (INDEPENDENT_AMBULATORY_CARE_PROVIDER_SITE_OTHER): Payer: 59 | Admitting: Obstetrics & Gynecology

## 2013-03-29 VITALS — BP 131/88 | Temp 98.5°F | Wt 187.0 lb

## 2013-03-29 DIAGNOSIS — Z3480 Encounter for supervision of other normal pregnancy, unspecified trimester: Secondary | ICD-10-CM

## 2013-03-29 DIAGNOSIS — Z3483 Encounter for supervision of other normal pregnancy, third trimester: Secondary | ICD-10-CM

## 2013-03-29 LAB — POCT URINALYSIS DIPSTICK
Bilirubin, UA: NEGATIVE
Glucose, UA: NEGATIVE
Ketones, UA: NEGATIVE
Spec Grav, UA: 1.015
Urobilinogen, UA: NEGATIVE

## 2013-03-29 NOTE — Progress Notes (Signed)
P 118 Patient reports she is having some lower pressure and some irregular contractions- 3/day. Patient states thicker discharge- but no uncomfortable symptoms.Patient reports 1 episode of tachycardia last week- heart rate110. O2 sat 99 P 120

## 2013-03-29 NOTE — Patient Instructions (Addendum)
Heartburn During Pregnancy   Heartburn is a burning sensation in the chest caused by stomach acid backing up into the esophagus. Heartburn (also known as "reflux") is common in pregnancy because a certain hormone (progesterone) changes. The progesterone hormone may relax the valve that separates the esophagus from the stomach. This allows acid to go up into the esophagus, causing heartburn. Heartburn may also happen in pregnancy because the enlarging uterus pushes up on the stomach, which pushes more acid into the esophagus. This is especially true in the later stages of pregnancy. Heartburn problems usually go away after giving birth.  CAUSES   · The progesterone hormone.  · Changing hormone levels.  · The growing uterus that pushes stomach acid upward.  · Large meals.  · Certain foods and drinks.  · Exercise.  · Increased acid production.  SYMPTOMS   · Burning pain in the chest or lower throat.  · Bitter taste in the mouth.  · Coughing.  DIAGNOSIS   Heartburn is typically diagnosed by your caregiver when taking a careful history of your concern. Your caregiver may order a blood test to check for a certain type of bacteria that is associated with heartburn. Sometimes, heartburn is diagnosed by prescribing a heartburn medicine to see if the symptoms improve. It is rare in pregnancy to have a procedure called an endoscopy. This is when a tube with a light and a camera on the end is used to examine the esophagus and the stomach.  TREATMENT   · Your caregiver may tell you to use certain over-the-counter medicines (antacids, acid reducers) for mild heartburn.  · Your caregiver may prescribe medicines to decrease stomach acid or to protect your stomach lining.  · Your caregiver may recommend certain diet changes.  · For severe cases, your caregiver may recommend that the head of the bed be elevated on blocks. (Sleeping with more pillows is not an effective treatment as it only changes the position of your head and does  not improve the main problem of stomach acid refluxing into the esophagus.)  HOME CARE INSTRUCTIONS   · Take all medicines as directed by your caregiver.  · Raise the head of your bed by putting blocks under the legs if instructed to by your caregiver.  · Do not exercise right after eating.  · Avoid eating 2 or 3 hours before bed. Do not lie down right after eating.  · Eat small meals throughout the day instead of 3 large meals.  · Identify foods and beverages that make your symptoms worse and avoid them. Foods you may want to avoid include:  · Peppers.  · Chocolate.  · High-fat foods, including fried foods.  · Spicy foods.  · Garlic and onions.  · Citrus fruits, including oranges, grapefruit, lemons, and limes.  · Food containing tomatoes or tomato products.  · Mint.  · Carbonated and caffeinated drinks.  · Vinegar.  SEEK IMMEDIATE MEDICAL CARE IF:   · You have severe chest pain that goes down your arm or into your jaw or neck.  · You feel sweaty, dizzy, or lightheaded.  · You become short of breath.  · You vomit blood.  · You have difficulty or pain with swallowing.  · You have bloody or black, tarry stools.  · You have episodes of heartburn more than 3 times a week, for more than 2 weeks.  MAKE SURE YOU:  · Understand these instructions.  · Will watch your condition.  · Will get 

## 2013-03-30 ENCOUNTER — Encounter: Payer: Self-pay | Admitting: Obstetrics & Gynecology

## 2013-03-30 NOTE — Progress Notes (Signed)
No new complaints.

## 2013-04-12 ENCOUNTER — Encounter: Payer: Self-pay | Admitting: Obstetrics & Gynecology

## 2013-04-12 ENCOUNTER — Ambulatory Visit (INDEPENDENT_AMBULATORY_CARE_PROVIDER_SITE_OTHER): Payer: 59 | Admitting: Obstetrics & Gynecology

## 2013-04-12 VITALS — BP 122/82 | Temp 98.2°F | Wt 190.2 lb

## 2013-04-12 DIAGNOSIS — Z3480 Encounter for supervision of other normal pregnancy, unspecified trimester: Secondary | ICD-10-CM

## 2013-04-12 DIAGNOSIS — Z3483 Encounter for supervision of other normal pregnancy, third trimester: Secondary | ICD-10-CM

## 2013-04-12 LAB — POCT URINALYSIS DIPSTICK
Glucose, UA: NEGATIVE
Ketones, UA: NEGATIVE
Leukocytes, UA: NEGATIVE
Protein, UA: NEGATIVE

## 2013-04-12 NOTE — Progress Notes (Signed)
Pulse: 99 

## 2013-04-12 NOTE — Progress Notes (Signed)
Doing well 

## 2013-04-19 ENCOUNTER — Inpatient Hospital Stay (HOSPITAL_COMMUNITY)
Admission: AD | Admit: 2013-04-19 | Discharge: 2013-04-19 | Disposition: A | Discharge status: Home / Self Care | Payer: 59 | Source: Ambulatory Visit | Attending: Obstetrics & Gynecology | Admitting: Obstetrics & Gynecology

## 2013-04-19 ENCOUNTER — Encounter (HOSPITAL_COMMUNITY): Payer: Self-pay | Admitting: *Deleted

## 2013-04-19 DIAGNOSIS — R0602 Shortness of breath: Secondary | ICD-10-CM | POA: Insufficient documentation

## 2013-04-19 DIAGNOSIS — O99891 Other specified diseases and conditions complicating pregnancy: Secondary | ICD-10-CM | POA: Insufficient documentation

## 2013-04-19 DIAGNOSIS — Z3482 Encounter for supervision of other normal pregnancy, second trimester: Secondary | ICD-10-CM

## 2013-04-19 DIAGNOSIS — I9589 Other hypotension: Secondary | ICD-10-CM | POA: Insufficient documentation

## 2013-04-19 LAB — CBC
Hemoglobin: 10.7 g/dL — ABNORMAL LOW (ref 12.0–15.0)
RBC: 3.45 MIL/uL — ABNORMAL LOW (ref 3.87–5.11)
WBC: 6.6 10*3/uL (ref 4.0–10.5)

## 2013-04-19 LAB — COMPREHENSIVE METABOLIC PANEL
ALT: 15 U/L (ref 0–35)
Alkaline Phosphatase: 91 U/L (ref 39–117)
CO2: 20 mEq/L (ref 19–32)
Chloride: 101 mEq/L (ref 96–112)
GFR calc Af Amer: >90 mL/min (ref >90)
Glucose, Bld: 96 mg/dL (ref 70–99)
Potassium: 4 mEq/L (ref 3.5–5.1)
Sodium: 132 mEq/L — ABNORMAL LOW (ref 135–145)
Total Protein: 6.3 g/dL (ref 6.0–8.3)

## 2013-04-19 LAB — URINALYSIS, ROUTINE W REFLEX MICROSCOPIC
Ketones, ur: NEGATIVE mg/dL
Leukocytes, UA: NEGATIVE
Nitrite: NEGATIVE
Protein, ur: NEGATIVE mg/dL
pH: 7.5 (ref 5.0–8.0)

## 2013-04-19 NOTE — MAU Provider Note (Signed)
History     CSN: [id number]  Arrival date and time: 04/19/13 1240   First Provider Initiated Contact with Patient 04/19/13 1322      Chief Complaint  Patient presents with  . Shortness of Breath   Shortness of Breath    Gina Chung is a 37 y.o. [phone] at [redacted]w[redacted]d who presents today with shortness of breath and "racing heart" x 3-4 days. She states that over the last few days she has noticed when she lays down on her back she will feel short of breath and feel her heartbeat faster. She states when she rolls to her side the symptoms go away. She denies any complications with the pregnancy. She denies any UCs, LOF or VB. She confirms fetal movement.   Past Medical History  Diagnosis Date  . Migraine   . History of UTI   . Language barrier     arabic and english  . Anemia     Past Surgical History  Procedure Laterality Date  . Colposcopy      Family History  Problem Relation Age of Onset  . Rheum arthritis Mother   . Hypertension Father     History  Substance Use Topics  . Smoking status: Never Smoker   . Smokeless tobacco: Never Used  . Alcohol Use: No    Allergies: No Known Allergies  Prescriptions prior to admission  Medication Sig Dispense Refill  . calcium carbonate (TUMS - DOSED IN MG ELEMENTAL CALCIUM) 500 MG chewable tablet Chew 2 tablets by mouth daily.      . Prenat-FeCbn-FeAspGl-FA-Omega (OB COMPLETE PETITE) 35-5-1-200 MG CAPS Take 1 capsule by mouth daily.        Review of Systems  Respiratory: Positive for shortness of breath.    Physical Exam   Blood pressure 140/76, pulse 88, resp. rate 18, height 5\' 2"  (1.575 m), weight 84.369 kg (186 lb), last menstrual period 08/27/2012, SpO2 100.00%.  Physical Exam  Nursing note and vitals reviewed. Constitutional: She is oriented to person, place, and time. She appears well-developed and well-nourished. No distress.  Cardiovascular: Normal rate, normal heart sounds and intact distal pulses.    Respiratory: Effort normal and breath sounds normal. No respiratory distress.  GI: Soft. There is no tenderness.  Musculoskeletal: She exhibits no edema and no tenderness.  Neurological: She is alert and oriented to person, place, and time.  Skin: Skin is warm and dry. No erythema.  Psychiatric: She has a normal mood and affect.    MAU Course  Procedures  Results for orders placed during the hospital encounter of 04/19/13 (from the past 24 hour(s))  URINALYSIS, ROUTINE W REFLEX MICROSCOPIC     Status: Abnormal   Collection Time    04/19/13  1:00 PM      Result Value Range   Color, Urine YELLOW  YELLOW   APPearance CLOUDY (*) CLEAR   Specific Gravity, Urine 1.020  1.005 - 1.030   pH 7.5  5.0 - 8.0   Glucose, UA NEGATIVE  NEGATIVE mg/dL   Hgb urine dipstick NEGATIVE  NEGATIVE   Bilirubin Urine NEGATIVE  NEGATIVE   Ketones, ur NEGATIVE  NEGATIVE mg/dL   Protein, ur NEGATIVE  NEGATIVE mg/dL   Urobilinogen, UA 0.2  0.0 - 1.0 mg/dL   Nitrite NEGATIVE  NEGATIVE   Leukocytes, UA NEGATIVE  NEGATIVE   1420: Spoke with Dr. Tina Griffiths, okay for dc home.   Assessment and Plan   1. Supervision of other normal pregnancy, second trimester  2. Supine hypotensive syndrome, third trimester    Discussed sleeping habits and safe sleeping Encouraged her to not sleep/lay on back 3rd trimester danger signs reviewed FU with the office Return to MAU as needed   Tawnya Crook 04/19/2013, 1:29 PM

## 2013-04-19 NOTE — MAU Note (Signed)
Last 3 or 4 days, feeling short of breath, esp when she lays down- has to lay with 2 or 3 pillows.   Notices heart beating fast when this is going on.

## 2013-04-26 ENCOUNTER — Inpatient Hospital Stay (HOSPITAL_COMMUNITY)
Admission: AD | Admit: 2013-04-26 | Discharge: 2013-04-26 | Disposition: A | Discharge status: Home / Self Care | Payer: 59 | Source: Ambulatory Visit | Attending: Obstetrics & Gynecology | Admitting: Obstetrics & Gynecology

## 2013-04-26 ENCOUNTER — Encounter: Payer: Self-pay | Admitting: Obstetrics & Gynecology

## 2013-04-26 ENCOUNTER — Inpatient Hospital Stay (HOSPITAL_COMMUNITY): Payer: 59

## 2013-04-26 ENCOUNTER — Encounter (HOSPITAL_COMMUNITY): Payer: Self-pay | Admitting: *Deleted

## 2013-04-26 ENCOUNTER — Encounter: Payer: 59 | Admitting: Obstetrics & Gynecology

## 2013-04-26 ENCOUNTER — Ambulatory Visit (INDEPENDENT_AMBULATORY_CARE_PROVIDER_SITE_OTHER): Payer: 59 | Admitting: Obstetrics & Gynecology

## 2013-04-26 ENCOUNTER — Other Ambulatory Visit: Payer: Self-pay | Admitting: *Deleted

## 2013-04-26 VITALS — BP 120/76 | Temp 97.9°F | Wt 187.2 lb

## 2013-04-26 DIAGNOSIS — Z3483 Encounter for supervision of other normal pregnancy, third trimester: Secondary | ICD-10-CM

## 2013-04-26 DIAGNOSIS — R0601 Orthopnea: Secondary | ICD-10-CM

## 2013-04-26 DIAGNOSIS — R000 Tachycardia, unspecified: Secondary | ICD-10-CM | POA: Insufficient documentation

## 2013-04-26 DIAGNOSIS — Z3482 Encounter for supervision of other normal pregnancy, second trimester: Secondary | ICD-10-CM

## 2013-04-26 DIAGNOSIS — O99891 Other specified diseases and conditions complicating pregnancy: Secondary | ICD-10-CM | POA: Insufficient documentation

## 2013-04-26 DIAGNOSIS — R0602 Shortness of breath: Secondary | ICD-10-CM | POA: Insufficient documentation

## 2013-04-26 DIAGNOSIS — Z3480 Encounter for supervision of other normal pregnancy, unspecified trimester: Secondary | ICD-10-CM

## 2013-04-26 LAB — POCT URINALYSIS DIPSTICK
Bilirubin, UA: NEGATIVE
Blood, UA: NEGATIVE
Nitrite, UA: NEGATIVE
pH, UA: 8

## 2013-04-26 LAB — BLOOD GAS, ARTERIAL
Bicarbonate: 19.6 mEq/L — ABNORMAL LOW (ref 20.0–24.0)
Drawn by: 12507
TCO2: 20.5 mmol/L (ref 0–100)
pCO2 arterial: 29.8 mmHg — ABNORMAL LOW (ref 35.0–45.0)
pH, Arterial: 7.434 (ref 7.350–7.450)

## 2013-04-26 NOTE — Progress Notes (Signed)
Pulse- 101.  Patient states that when she lays on her back she has shortness of breath and an increase in heart rate.  Patient went to [facility] last week and only monitored the baby.  Patient requests EKG today.  99% o2 sat on room air.

## 2013-04-26 NOTE — MAU Provider Note (Signed)
Chief Complaint:  Shortness of Breath   None     HPI: Gina Chung is a 37 y.o. [phone] at [redacted]w[redacted]d who presents to maternity admissions from the office reporting SOB and feeling like her heat is pounding which occurs when the pt is lying down.  She had a normal EKG in the office today.  The symptoms resolve in 1-2 hours if pt sleeps with head elevated on pillows.  She reports good fetal movement, denies LOF, vaginal bleeding, vaginal itching/burning, urinary symptoms, h/a, dizziness, n/v, or fever/chills.     Past Medical History: Past Medical History  Diagnosis Date  . Migraine   . History of UTI   . Language barrier     arabic and english  . Anemia     Past obstetric history: OB History  Gravida Para Term Preterm AB SAB TAB Ectopic Multiple Living  6 3 3  0 1 1 0 0 0 3    # Outcome Date GA Lbr Len/2nd Weight Sex Delivery Anes PTL Lv  6 CUR           5 TRM 12/31/11 [redacted]w[redacted]d 05:30 / 00:07 3.61 kg (7 lb 15.3 oz) F SVD EPI  Y     Comments: wnl  4 SAB 2010          3 TRM 2008 [redacted]w[redacted]d 02:00 3.204 kg (7 lb 1 oz) F SVD EPI  Y  2 TRM 2006 [redacted]w[redacted]d 12:00 3.345 kg (7 lb 6 oz) M SVD EPI  Y  1 GRA              Comments: System Generated. Please review and update pregnancy details.      Past Surgical History: Past Surgical History  Procedure Laterality Date  . Colposcopy      Family History: Family History  Problem Relation Age of Onset  . Rheum arthritis Mother   . Hypertension Father     Social History: History  Substance Use Topics  . Smoking status: Never Smoker   . Smokeless tobacco: Never Used  . Alcohol Use: No    Allergies: No Known Allergies  Meds:  Prescriptions prior to admission  Medication Sig Dispense Refill  . calcium carbonate (TUMS - DOSED IN MG ELEMENTAL CALCIUM) 500 MG chewable tablet Chew 2 tablets by mouth 3 (three) times daily as needed for heartburn.      . Prenatal Vit-Fe Fumarate-FA (PRENATAL MULTIVITAMIN) TABS tablet Take 1 tablet by mouth daily at 12  noon.        ROS: Pertinent findings in history of present illness.  Physical Exam  Blood pressure 102/68, pulse 89, temperature 98 F (36.7 C), temperature source Oral, resp. rate 18, last menstrual period 08/27/2012, SpO2 100.00%. GENERAL: Well-developed, well-nourished female in no acute distress.  HEENT: normocephalic HEART: normal rate RESP: normal effort ABDOMEN: Soft, non-tender, gravid appropriate for gestational age EXTREMITIES: Nontender, no edema NEURO: alert and oriented   FHT:  Baseline 135 , moderate variability, accelerations present, no decelerations Contractions: None, some irritability  Labs: Results for orders placed during the hospital encounter of 04/26/13 (from the past 24 hour(s))  BLOOD GAS, ARTERIAL     Status: Abnormal   Collection Time    04/26/13  2:32 PM      Result Value Range   FIO2 0.21     Delivery systems ROOM AIR     pH, Arterial 7.434  7.350 - 7.450   pCO2 arterial 29.8 (*) 35.0 - 45.0 mmHg   pO2, Arterial  98.2  80.0 - 100.0 mmHg   Bicarbonate 19.6 (*) 20.0 - 24.0 mEq/L   TCO2 20.5  0 - 100 mmol/L   Acid-base deficit 2.9 (*) 0.0 - 2.0 mmol/L   Collection site RIGHT RADIAL     Drawn by [id number]     Sample type ARTERIAL     Allens test (pass/fail) PASS  PASS    Imaging:  Dg Chest 2 View  04/26/2013   *RADIOLOGY REPORT*  Clinical Data: [redacted] weeks pregnant.  Shortness of breath.  CHEST - 2 VIEW  Comparison: 09/20/2012  Findings: Patient shielded for this examination.  Heart size is normal.  Mediastinal shadows are normal.  Lungs are clear.  No effusions.  No bony abnormalities.  IMPRESSION: Normal chest   Original Report Authenticated By: Paulina Fusi, M.D.    Assessment: 1. SOB (shortness of breath)     Plan: Consult with Dr Tamela Oddi Discharge home Discussed physiologic changes of pregnancy Reassurance provided with normal test results presented to pt F/U in office Return to MAU as needed  Follow-up Information   Follow up with  Roseanna Rainbow, MD.   Specialty:  Obstetrics and Gynecology   Contact information:   [address] [place] [address] [phone]        Medication List         calcium carbonate 500 MG chewable tablet  Commonly known as:  TUMS - dosed in mg elemental calcium  Chew 2 tablets by mouth 3 (three) times daily as needed for heartburn.     prenatal multivitamin Tabs tablet  Take 1 tablet by mouth daily at 12 noon.        Sharen Counter Certified Nurse-Midwife 04/26/2013 3:38 PM

## 2013-04-26 NOTE — MAU Note (Signed)
Patient states she has been having shortness of breath and was seen in the office and sent to MAU for evaluation. Patient denies contractions, leaking or bleeding and reports good fetal movement.

## 2013-04-26 NOTE — MAU Note (Signed)
Pt states she has been experiencing SOB for 1 month now and states it is getting worse.  Pt denies recent illness.  Pt states she can feel her heart racing at times.  Pt's not in any distress at this time.

## 2013-04-27 ENCOUNTER — Encounter: Payer: Self-pay | Admitting: Obstetrics & Gynecology

## 2013-04-27 NOTE — Progress Notes (Signed)
Lungs CTA--good air movement.  EKG OK.  Symptoms likely physiologic.  To [facility] for CBG/CXR.

## 2013-04-27 NOTE — Patient Instructions (Signed)

## 2013-05-03 ENCOUNTER — Ambulatory Visit (INDEPENDENT_AMBULATORY_CARE_PROVIDER_SITE_OTHER): Payer: 59 | Admitting: Obstetrics

## 2013-05-03 VITALS — BP 120/77 | Temp 97.9°F | Wt 191.0 lb

## 2013-05-03 DIAGNOSIS — Z3480 Encounter for supervision of other normal pregnancy, unspecified trimester: Secondary | ICD-10-CM

## 2013-05-03 NOTE — Progress Notes (Signed)
P-92 Pt states she is having lower abdomen and lower back pain.

## 2013-05-05 ENCOUNTER — Encounter: Payer: Self-pay | Admitting: Obstetrics

## 2013-05-05 ENCOUNTER — Ambulatory Visit (INDEPENDENT_AMBULATORY_CARE_PROVIDER_SITE_OTHER): Payer: 59 | Admitting: Obstetrics

## 2013-05-05 VITALS — BP 123/74 | Temp 97.5°F | Wt 188.0 lb

## 2013-05-05 DIAGNOSIS — Z3483 Encounter for supervision of other normal pregnancy, third trimester: Secondary | ICD-10-CM

## 2013-05-05 DIAGNOSIS — Z3480 Encounter for supervision of other normal pregnancy, unspecified trimester: Secondary | ICD-10-CM

## 2013-05-05 NOTE — Progress Notes (Signed)
P 96 Patient does have lower abdominal pain- but nothing that is worrying her

## 2013-05-17 ENCOUNTER — Encounter: Payer: 59 | Admitting: Obstetrics & Gynecology

## 2013-05-18 ENCOUNTER — Ambulatory Visit (INDEPENDENT_AMBULATORY_CARE_PROVIDER_SITE_OTHER): Payer: 59 | Admitting: Obstetrics & Gynecology

## 2013-05-18 VITALS — BP 116/75 | Temp 97.1°F | Wt 190.0 lb

## 2013-05-18 DIAGNOSIS — Z3480 Encounter for supervision of other normal pregnancy, unspecified trimester: Secondary | ICD-10-CM

## 2013-05-18 DIAGNOSIS — Z3483 Encounter for supervision of other normal pregnancy, third trimester: Secondary | ICD-10-CM

## 2013-05-18 LAB — POCT URINALYSIS DIPSTICK
Bilirubin, UA: NEGATIVE
Glucose, UA: NEGATIVE
Ketones, UA: NEGATIVE
Spec Grav, UA: 1.01

## 2013-05-18 NOTE — Progress Notes (Signed)
Pt states she is having pressure in lower abdomen. Pt states she is having a sharp pain that happens only when she goes from a sitting to standing position.

## 2013-05-20 LAB — STREP B DNA PROBE: GBSP: NEGATIVE

## 2013-05-25 ENCOUNTER — Encounter: Payer: Self-pay | Admitting: Obstetrics & Gynecology

## 2013-05-25 ENCOUNTER — Ambulatory Visit (INDEPENDENT_AMBULATORY_CARE_PROVIDER_SITE_OTHER): Payer: 59 | Admitting: Obstetrics & Gynecology

## 2013-05-25 VITALS — BP 117/77 | Temp 98.2°F | Wt 190.6 lb

## 2013-05-25 DIAGNOSIS — Z3483 Encounter for supervision of other normal pregnancy, third trimester: Secondary | ICD-10-CM

## 2013-05-25 DIAGNOSIS — Z3480 Encounter for supervision of other normal pregnancy, unspecified trimester: Secondary | ICD-10-CM

## 2013-05-25 DIAGNOSIS — Z23 Encounter for immunization: Secondary | ICD-10-CM

## 2013-05-25 LAB — POCT URINALYSIS DIPSTICK
Bilirubin, UA: NEGATIVE
Glucose, UA: NEGATIVE
Leukocytes, UA: NEGATIVE
Nitrite, UA: NEGATIVE

## 2013-05-25 NOTE — Patient Instructions (Signed)
Influenza Vaccine (Flu Vaccine, Inactivated) 2013 2014 What You Need to Know WHY GET VACCINATED?  Influenza ("flu") is a contagious disease that spreads around the [place] every winter, usually between October and May.  Flu is caused by the influenza virus, and can be spread by coughing, sneezing, and close contact.  Anyone can get flu, but the risk of getting flu is highest among children. Symptoms come on suddenly and may last several days. They can include:  Fever or chills.  Sore throat.  Muscle aches.  Fatigue.  Cough.  Headache.  Runny or stuffy nose. Flu can make some people much sicker than others. These people include young children, people 65 and older, pregnant women, and people with certain health conditions such as heart, lung or kidney disease, or a weakened immune system. Flu vaccine is especially important for these people, and anyone in close contact with them. Flu can also lead to pneumonia, and make existing medical conditions worse. It can cause diarrhea and seizures in children. Each year thousands of people in the [place] die from flu, and many more are hospitalized. Flu vaccine is the best protection we have from flu and its complications. Flu vaccine also helps prevent spreading flu from person to person. INACTIVATED FLU VACCINE There are 2 types of influenza vaccine:  You are getting an inactivated flu vaccine, which does not contain any live influenza virus. It is given by injection with a needle, and often called the "flu shot."  A different live, attenuated (weakened) influenza vaccine is sprayed into the nostrils. This vaccine is described in a separate Vaccine Information Statement. Flu vaccine is recommended every year. Children 6 months through 8 years of age should get 2 doses the first year they get vaccinated. Flu viruses are always changing. Each year's flu vaccine is made to protect from viruses that are most likely to cause disease  that year. While flu vaccine cannot prevent all cases of flu, it is our best defense against the disease. Inactivated flu vaccine protects against 3 or 4 different influenza viruses. It takes about 2 weeks for protection to develop after the vaccination, and protection lasts several months to a year. Some illnesses that are not caused by influenza virus are often mistaken for flu. Flu vaccine will not prevent these illnesses. It can only prevent influenza. A "high-dose" flu vaccine is available for people 65 years of age and older. The person giving you the vaccine can tell you more about it. Some inactivated flu vaccine contains a very small amount of a mercury-based preservative called thimerosal. Studies have shown that thimerosal in vaccines is not harmful, but flu vaccines that do not contain a preservative are available. SOME PEOPLE SHOULD NOT GET THIS VACCINE Tell the person who gives you the vaccine:  If you have any severe (life-threatening) allergies. If you ever had a life-threatening allergic reaction after a dose of flu vaccine, or have a severe allergy to any part of this vaccine, you may be advised not to get a dose. Most, but not all, types of flu vaccine contain a small amount of egg.  If you ever had Guillain Barr Syndrome (a severe paralyzing illness, also called GBS). Some people with a history of GBS should not get this vaccine. This should be discussed with your doctor.  If you are not feeling well. They might suggest waiting until you feel better. But you should come back. RISKS OF A VACCINE REACTION With a vaccine, like any medicine, there   is a chance of side effects. These are usually mild and go away on their own. Serious side effects are also possible, but are very rare. Inactivated flu vaccine does not contain live flu virus, sogetting flu from this vaccine is not possible. Brief fainting spells and related symptoms (such as jerking movements) can happen after any medical  procedure, including vaccination. Sitting or lying down for about 15 minutes after a vaccination can help prevent fainting and injuries caused by falls. Tell your doctor if you feel dizzy or lightheaded, or have vision changes or ringing in the ears. Mild problems following inactivated flu vaccine:  Soreness, redness, or swelling where the shot was given.  Hoarseness; sore, red or itchy eyes; or cough.  Fever.  Aches.  Headache.  Itching.  Fatigue. If these problems occur, they usually begin soon after the shot and last 1 or 2 days. Moderate problems following inactivated flu vaccine:  Young children who get inactivated flu vaccine and pneumococcal vaccine (PCV13) at the same time may be at increased risk for seizures caused by fever. Ask your doctor for more information. Tell your doctor if a child who is getting flu vaccine has ever had a seizure. Severe problems following inactivated flu vaccine:  A severe allergic reaction could occur after any vaccine (estimated less than 1 in a million doses).  There is a small possibility that inactivated flu vaccine could be associated with Guillan Barr Syndrome (GBS), no more than 1 or 2 cases per million people vaccinated. This is much lower than the risk of severe complications from flu, which can be prevented by flu vaccine. The safety of vaccines is always being monitored. For more information, visit: www.cdc.gov/vaccinesafety/ WHAT IF THERE IS A SERIOUS REACTION? What should I look for?  Look for anything that concerns you, such as signs of a severe allergic reaction, very high fever, or behavior changes. Signs of a severe allergic reaction can include hives, swelling of the face and throat, difficulty breathing, a fast heartbeat, dizziness, and weakness. These would start a few minutes to a few hours after the vaccination. What should I do?  If you think it is a severe allergic reaction or other emergency that cannot wait, call 9 1 1  or get the person to the nearest hospital. Otherwise, call your doctor.  Afterward, the reaction should be reported to the Vaccine Adverse Event Reporting System (VAERS). Your doctor might file this report, or you can do it yourself through the VAERS website at www.vaers.hhs.gov, or by calling 1-800-822-7967. VAERS is only for reporting reactions. They do not give medical advice. THE NATIONAL VACCINE INJURY COMPENSATION PROGRAM The National Vaccine Injury Compensation Program (VICP) is a federal program that was created to compensate people who may have been injured by certain vaccines. Persons who believe they may have been injured by a vaccine can learn about the program and about filing a claim by calling 1-800-338-2382 or visiting the VICP website at www.hrsa.gov/vaccinecompensation HOW CAN I LEARN MORE?  Ask your doctor.  Call your local or state health department.  Contact the Centers for Disease Control and Prevention (CDC):  Call 1-800-232-4636 (1-800-CDC-INFO) or  Visit CDC's website at www.cdc.gov/flu CDC Inactivated Influenza Vaccine Interim VIS (04/08/12) Document Released: 06/25/2006 Document Revised: 05/25/2012 Document Reviewed: 04/08/2012 Exit[employer] Patient Information 2014 [employer].  

## 2013-05-25 NOTE — Progress Notes (Signed)
Doing well 

## 2013-05-25 NOTE — Progress Notes (Signed)
Pulse: 90

## 2013-06-01 ENCOUNTER — Encounter: Payer: Self-pay | Admitting: Obstetrics & Gynecology

## 2013-06-01 ENCOUNTER — Ambulatory Visit (INDEPENDENT_AMBULATORY_CARE_PROVIDER_SITE_OTHER): Payer: 59 | Admitting: Obstetrics & Gynecology

## 2013-06-01 VITALS — BP 124/78 | Temp 98.2°F | Wt 194.6 lb

## 2013-06-01 DIAGNOSIS — Z3483 Encounter for supervision of other normal pregnancy, third trimester: Secondary | ICD-10-CM

## 2013-06-01 DIAGNOSIS — Z3480 Encounter for supervision of other normal pregnancy, unspecified trimester: Secondary | ICD-10-CM

## 2013-06-01 LAB — POCT URINALYSIS DIPSTICK
Glucose, UA: NEGATIVE
Nitrite, UA: NEGATIVE
Protein, UA: NEGATIVE
Urobilinogen, UA: NEGATIVE

## 2013-06-01 NOTE — Progress Notes (Signed)
Pulse: 86

## 2013-06-01 NOTE — Patient Instructions (Signed)

## 2013-06-01 NOTE — Progress Notes (Signed)
?  passed mucous plug; irregular UCs

## 2013-06-08 ENCOUNTER — Encounter (HOSPITAL_COMMUNITY): Payer: Self-pay | Admitting: *Deleted

## 2013-06-08 ENCOUNTER — Other Ambulatory Visit: Payer: Self-pay | Admitting: Obstetrics & Gynecology

## 2013-06-08 ENCOUNTER — Inpatient Hospital Stay (HOSPITAL_COMMUNITY)
Admission: AD | Admit: 2013-06-08 | Discharge: 2013-06-08 | Disposition: A | Discharge status: Home / Self Care | Payer: 59 | Source: Ambulatory Visit | Attending: Obstetrics & Gynecology | Admitting: Obstetrics & Gynecology

## 2013-06-08 ENCOUNTER — Ambulatory Visit (INDEPENDENT_AMBULATORY_CARE_PROVIDER_SITE_OTHER): Payer: 59 | Admitting: Obstetrics & Gynecology

## 2013-06-08 ENCOUNTER — Ambulatory Visit (HOSPITAL_COMMUNITY)
Admission: RE | Admit: 2013-06-08 | Discharge: 2013-06-08 | Disposition: A | Discharge status: Home / Self Care | Payer: 59 | Source: Ambulatory Visit | Attending: Obstetrics & Gynecology | Admitting: Obstetrics & Gynecology

## 2013-06-08 ENCOUNTER — Encounter (HOSPITAL_COMMUNITY): Payer: Self-pay | Admitting: Anesthesiology

## 2013-06-08 ENCOUNTER — Inpatient Hospital Stay (HOSPITAL_COMMUNITY)
Admission: AD | Admit: 2013-06-08 | Discharge: 2013-06-10 | DRG: 775 | Disposition: A | Discharge status: Home / Self Care | Payer: 59 | Source: Ambulatory Visit | Attending: Obstetrics | Admitting: Obstetrics

## 2013-06-08 ENCOUNTER — Inpatient Hospital Stay (HOSPITAL_COMMUNITY): Payer: 59 | Admitting: Anesthesiology

## 2013-06-08 ENCOUNTER — Ambulatory Visit: Payer: 59

## 2013-06-08 VITALS — BP 121/82 | Temp 97.8°F | Wt 191.4 lb

## 2013-06-08 DIAGNOSIS — Z3483 Encounter for supervision of other normal pregnancy, third trimester: Secondary | ICD-10-CM

## 2013-06-08 DIAGNOSIS — O288 Other abnormal findings on antenatal screening of mother: Secondary | ICD-10-CM

## 2013-06-08 DIAGNOSIS — O479 False labor, unspecified: Secondary | ICD-10-CM | POA: Insufficient documentation

## 2013-06-08 DIAGNOSIS — O4103X0 Oligohydramnios, third trimester, not applicable or unspecified: Secondary | ICD-10-CM | POA: Diagnosis present

## 2013-06-08 DIAGNOSIS — O480 Post-term pregnancy: Secondary | ICD-10-CM

## 2013-06-08 DIAGNOSIS — Z3480 Encounter for supervision of other normal pregnancy, unspecified trimester: Secondary | ICD-10-CM

## 2013-06-08 DIAGNOSIS — O09523 Supervision of elderly multigravida, third trimester: Secondary | ICD-10-CM

## 2013-06-08 DIAGNOSIS — O4100X0 Oligohydramnios, unspecified trimester, not applicable or unspecified: Principal | ICD-10-CM | POA: Diagnosis present

## 2013-06-08 LAB — CBC
HCT: 36.8 % (ref 36.0–46.0)
Hemoglobin: 12.4 g/dL (ref 12.0–15.0)
MCH: 30.3 pg (ref 26.0–34.0)
MCHC: 33.7 g/dL (ref 30.0–36.0)
MCV: 90 fL (ref 78.0–100.0)
Platelets: 269 10*3/uL (ref 150–400)
RBC: 4.09 MIL/uL (ref 3.87–5.11)
RDW: 14.3 % (ref 11.5–15.5)
WBC: 9.6 10*3/uL (ref 4.0–10.5)

## 2013-06-08 LAB — TYPE AND SCREEN
ABO/RH(D): O POS
Antibody Screen: NEGATIVE

## 2013-06-08 MED ORDER — LACTATED RINGERS IV SOLN: 500.0000 mL | Freq: Once | INTRAVENOUS | Status: DC

## 2013-06-08 MED ORDER — EPHEDRINE 5 MG/ML INJ
10.0000 mg | INTRAVENOUS | Status: DC | PRN
  Filled 2013-06-08: qty 2
  Filled 2013-06-08: qty 4

## 2013-06-08 MED ORDER — OXYTOCIN 40 UNITS IN LACTATED RINGERS INFUSION - SIMPLE MED: 62.5000 mL/h | INTRAVENOUS | Status: DC

## 2013-06-08 MED ORDER — PHENYLEPHRINE 40 MCG/ML (10ML) SYRINGE FOR IV PUSH (FOR BLOOD PRESSURE SUPPORT)
80.0000 ug | PREFILLED_SYRINGE | INTRAVENOUS | Status: DC | PRN
  Filled 2013-06-08: qty 2

## 2013-06-08 MED ORDER — PHENYLEPHRINE 40 MCG/ML (10ML) SYRINGE FOR IV PUSH (FOR BLOOD PRESSURE SUPPORT)
80.0000 ug | PREFILLED_SYRINGE | INTRAVENOUS | Status: DC | PRN
  Filled 2013-06-08: qty 5
  Filled 2013-06-08: qty 2

## 2013-06-08 MED ORDER — LIDOCAINE HCL (PF) 1 % IJ SOLN
30.0000 mL | INTRAMUSCULAR | Status: DC | PRN
  Filled 2013-06-08 (×2): qty 30

## 2013-06-08 MED ORDER — OXYTOCIN BOLUS FROM INFUSION: 500.0000 mL | INTRAVENOUS | Status: DC

## 2013-06-08 MED ORDER — TERBUTALINE SULFATE 1 MG/ML IJ SOLN: 0.2500 mg | Freq: Once | INTRAMUSCULAR | Status: AC | PRN

## 2013-06-08 MED ORDER — FENTANYL 2.5 MCG/ML BUPIVACAINE 1/10 % EPIDURAL INFUSION (WH - ANES)
14.0000 mL/h | INTRAMUSCULAR | Status: DC | PRN
  Administered 2013-06-08: 14 mL/h via EPIDURAL
  Filled 2013-06-08: qty 125

## 2013-06-08 MED ORDER — SODIUM BICARBONATE 8.4 % IV SOLN
INTRAVENOUS | Status: DC | PRN
  Administered 2013-06-08: 5 mL via EPIDURAL

## 2013-06-08 MED ORDER — CITRIC ACID-SODIUM CITRATE 334-500 MG/5ML PO SOLN: 30.0000 mL | ORAL | Status: DC | PRN

## 2013-06-08 MED ORDER — ACETAMINOPHEN 325 MG PO TABS: 650.0000 mg | ORAL_TABLET | ORAL | Status: DC | PRN

## 2013-06-08 MED ORDER — LACTATED RINGERS IV SOLN: 500.0000 mL | INTRAVENOUS | Status: DC | PRN

## 2013-06-08 MED ORDER — ONDANSETRON HCL 4 MG/2ML IJ SOLN: 4.0000 mg | Freq: Four times a day (QID) | INTRAMUSCULAR | Status: DC | PRN

## 2013-06-08 MED ORDER — LACTATED RINGERS IV SOLN
INTRAVENOUS | Status: DC
  Administered 2013-06-08: 18:00:00 via INTRAVENOUS

## 2013-06-08 MED ORDER — OXYTOCIN 40 UNITS IN LACTATED RINGERS INFUSION - SIMPLE MED
1.0000 m[IU]/min | INTRAVENOUS | Status: DC
  Administered 2013-06-08: 1 m[IU]/min via INTRAVENOUS
  Filled 2013-06-08: qty 1000

## 2013-06-08 MED ORDER — DIPHENHYDRAMINE HCL 50 MG/ML IJ SOLN: 12.5000 mg | INTRAMUSCULAR | Status: DC | PRN

## 2013-06-08 MED ORDER — EPHEDRINE 5 MG/ML INJ
10.0000 mg | INTRAVENOUS | Status: DC | PRN
  Filled 2013-06-08: qty 2

## 2013-06-08 MED ORDER — IBUPROFEN 600 MG PO TABS
600.0000 mg | ORAL_TABLET | Freq: Four times a day (QID) | ORAL | Status: DC | PRN
  Administered 2013-06-08 – 2013-06-09 (×2): 600 mg via ORAL
  Filled 2013-06-08 (×5): qty 1

## 2013-06-08 MED ORDER — OXYCODONE-ACETAMINOPHEN 5-325 MG PO TABS
1.0000 | ORAL_TABLET | ORAL | Status: DC | PRN
  Administered 2013-06-09 (×2): 1 via ORAL
  Filled 2013-06-08 (×2): qty 1

## 2013-06-08 NOTE — MAU Note (Signed)
Pt reports uc's q 20 minutes

## 2013-06-08 NOTE — Progress Notes (Unsigned)
Pulse: 85

## 2013-06-08 NOTE — Anesthesia Procedure Notes (Signed)

## 2013-06-08 NOTE — Anesthesia Preprocedure Evaluation (Signed)

## 2013-06-08 NOTE — H&P (Signed)
Gina Chung is a 37 y.o. female presenting for induction of labor. Maternal Medical History:  Reason for admission: She presented for a routine prenatal visit.  An informal U/S was suspect for low amniotic fluid.  This was confirmed by a formal U/S @ [facility].  She denies leaking fluid.  Contractions: Onset was 2 days ago.   Frequency: irregular.    Fetal activity: Perceived fetal activity is normal.    Prenatal complications: no prenatal complications Prenatal Complications - Diabetes: none.    OB History   Grav Para Term Preterm Abortions TAB SAB Ect Mult Living   6 3 3  0 1 0 1 0 0 3     Past Medical History  Diagnosis Date  . Migraine   . History of UTI   . Language barrier     arabic and english  . Anemia    Past Surgical History  Procedure Laterality Date  . Colposcopy     Family History: family history includes Hypertension in her father; Rheum arthritis in her mother. Social History:  reports that she has never smoked. She has never used smokeless tobacco. She reports that she does not drink alcohol or use illicit drugs.     Review of Systems  Constitutional: Negative for fever.  Eyes: Negative for blurred vision.  Respiratory: Negative for shortness of breath.   Gastrointestinal: Negative for vomiting.  Skin: Negative for rash.  Neurological: Negative for headaches.    Dilation: 5 Effacement (%): 80 Station: -2 Exam by:: J.Thornton, RN Blood pressure 115/77, pulse 80, temperature 97.5 F (36.4 C), height 5\' 2"  (1.575 m), weight 86.637 kg (191 lb), last menstrual period 08/27/2012. Maternal Exam:  Abdomen: Patient reports no abdominal tenderness. Fetal presentation: vertex  Introitus: not evaluated.   Pelvis: adequate for delivery.   Cervix: not evaluated.   Fetal Exam Fetal Monitor Review: Variability: moderate (6-25 bpm).   Pattern: accelerations present and no decelerations.    Fetal State Assessment: Category I - tracings are  normal.     Physical Exam  Constitutional: She appears well-developed.  HENT:  Head: Normocephalic.  Neck: Neck supple. No thyromegaly present.  Cardiovascular: Normal rate and regular rhythm.   Respiratory: Breath sounds normal.  GI: Soft. Bowel sounds are normal.  Skin: No rash noted.    Prenatal labs: ABO, Rh:   Antibody:   Rubella:   RPR: NON REAC (06/18 1023)  HBsAg:    HIV: NON REACTIVE (06/18 1023)  GBS: NEGATIVE (09/04 1822)   Assessment/Plan: Multipara @ [redacted]w[redacted]d.  Oligohydramnios--AF 5, MVP 1.5.  Favorable Bishop's score.  Category I FHT.  Admit Low dose Pitocin per protocol Anticipate an NSVD   JACKSON-MOORE,[place] Pulse A 06/08/2013, 6:38 PM

## 2013-06-08 NOTE — MAU Note (Signed)
Pt states uc started tonight at 2am and are q 20 mins. Denies vaginal bleeding or LOF. States good FM

## 2013-06-09 ENCOUNTER — Encounter: Payer: Self-pay | Admitting: Obstetrics & Gynecology

## 2013-06-09 LAB — CBC
MCH: 30.1 pg (ref 26.0–34.0)
MCHC: 33.3 g/dL (ref 30.0–36.0)
MCV: 90.2 fL (ref 78.0–100.0)
Platelets: 225 10*3/uL (ref 150–400)
RBC: 3.69 MIL/uL — ABNORMAL LOW (ref 3.87–5.11)

## 2013-06-09 MED ORDER — ONDANSETRON HCL 4 MG/2ML IJ SOLN: 4.0000 mg | INTRAMUSCULAR | Status: DC | PRN

## 2013-06-09 MED ORDER — SIMETHICONE 80 MG PO CHEW: 80.0000 mg | CHEWABLE_TABLET | ORAL | Status: DC | PRN

## 2013-06-09 MED ORDER — SENNOSIDES-DOCUSATE SODIUM 8.6-50 MG PO TABS
2.0000 | ORAL_TABLET | ORAL | Status: DC
  Administered 2013-06-09: 2 via ORAL

## 2013-06-09 MED ORDER — TETANUS-DIPHTH-ACELL PERTUSSIS 5-2.5-18.5 LF-MCG/0.5 IM SUSP: 0.5000 mL | Freq: Once | INTRAMUSCULAR | Status: DC

## 2013-06-09 MED ORDER — DIPHENHYDRAMINE HCL 25 MG PO CAPS: 25.0000 mg | ORAL_CAPSULE | Freq: Four times a day (QID) | ORAL | Status: DC | PRN

## 2013-06-09 MED ORDER — ONDANSETRON HCL 4 MG PO TABS: 4.0000 mg | ORAL_TABLET | ORAL | Status: DC | PRN

## 2013-06-09 MED ORDER — WITCH HAZEL-GLYCERIN EX PADS
1.0000 "application " | MEDICATED_PAD | CUTANEOUS | Status: DC | PRN
  Administered 2013-06-10: 1 via TOPICAL

## 2013-06-09 MED ORDER — ZOLPIDEM TARTRATE 5 MG PO TABS: 5.0000 mg | ORAL_TABLET | Freq: Every evening | ORAL | Status: DC | PRN

## 2013-06-09 MED ORDER — LANOLIN HYDROUS EX OINT: TOPICAL_OINTMENT | CUTANEOUS | Status: DC | PRN

## 2013-06-09 MED ORDER — PRENATAL MULTIVITAMIN CH
1.0000 | ORAL_TABLET | Freq: Every day | ORAL | Status: DC
  Administered 2013-06-09 – 2013-06-10 (×2): 1 via ORAL
  Filled 2013-06-09: qty 1

## 2013-06-09 MED ORDER — FERROUS SULFATE 325 (65 FE) MG PO TABS
325.0000 mg | ORAL_TABLET | Freq: Two times a day (BID) | ORAL | Status: DC
  Administered 2013-06-09 – 2013-06-10 (×3): 325 mg via ORAL
  Filled 2013-06-09 (×3): qty 1

## 2013-06-09 MED ORDER — BENZOCAINE-MENTHOL 20-0.5 % EX AERO: 1.0000 "application " | INHALATION_SPRAY | CUTANEOUS | Status: DC | PRN

## 2013-06-09 MED ORDER — OXYCODONE-ACETAMINOPHEN 5-325 MG PO TABS: 1.0000 | ORAL_TABLET | ORAL | Status: DC | PRN

## 2013-06-09 MED ORDER — DIBUCAINE 1 % RE OINT: 1.0000 "application " | TOPICAL_OINTMENT | RECTAL | Status: DC | PRN

## 2013-06-09 MED ORDER — IBUPROFEN 600 MG PO TABS
600.0000 mg | ORAL_TABLET | Freq: Four times a day (QID) | ORAL | Status: DC
  Administered 2013-06-09 – 2013-06-10 (×5): 600 mg via ORAL
  Filled 2013-06-09: qty 1

## 2013-06-09 NOTE — Progress Notes (Signed)
Informal U/S showed a cephalic presentation; overall fluid subjectively low.  To [facility] for AFI.

## 2013-06-09 NOTE — Anesthesia Postprocedure Evaluation (Signed)
Anesthesia Post Note  Patient: Gina Chung  Procedure(s) Performed: * No procedures listed *  Anesthesia type: Epidural  Patient location: Mother/Baby  Post pain: Pain level controlled  Post assessment: Post-op Vital signs reviewed  Last Vitals:  Filed Vitals:   06/09/13 0440  BP: 102/60  Pulse: 64  Temp: 36.8 C  Resp: 18    Post vital signs: Reviewed  Level of consciousness: awake  Complications: No apparent anesthesia complications

## 2013-06-09 NOTE — Progress Notes (Deleted)
Patient ID: Gina Chung, female   DOB: 1976-03-19, 37 y.o.   MRN: [id number] Post Partum Day 1 S/P induced vaginal RH status/Rubella reviewed.  Feeding: breast Subjective: No HA, SOB, CP, F/C, breast symptoms. Normal vaginal bleeding, no clots.     Objective: BP 102/60  Pulse 64  Temp(Src) 98.3 F (36.8 C) (Oral)  Resp 18  Ht 5\' 2"  (1.575 m)  Wt 86.637 kg (191 lb)  BMI 34.93 kg/m2  LMP 08/27/2012   Physical Exam:  General: alert Lochia: appropriate Uterine Fundus: firm DVT Evaluation: No evidence of DVT seen on physical exam. Ext: No c/c/e  Recent Labs  06/08/13 1730 06/09/13 0545  HGB 12.4 11.1*  HCT 36.8 33.3*      Assessment/Plan: 37 y.o.  PPD #1 .  normal postpartum exam Continue current postpartum care Ambulate   LOS: 1 day   Chung,Gina Ignasiak A 06/09/2013, 9:02 AM

## 2013-06-09 NOTE — Progress Notes (Signed)
Post Partum Day 1 Subjective: no complaints  Objective: Blood pressure 102/60, pulse 64, temperature 98.3 F (36.8 C), temperature source Oral, resp. rate 18, height 5\' 2"  (1.575 m), weight 191 lb (86.637 kg), last menstrual period 08/27/2012, unknown if currently breastfeeding.  Physical Exam:  General: alert and no distress Lochia: appropriate Uterine Fundus: firm Incision: healing well DVT Evaluation: No evidence of DVT seen on physical exam.   Recent Labs  06/08/13 1730 06/09/13 0545  HGB 12.4 11.1*  HCT 36.8 33.3*    Assessment/Plan: Plan for discharge tomorrow   LOS: 1 day   Verlyn Dannenberg A 06/09/2013, 6:11 AM

## 2013-06-10 ENCOUNTER — Inpatient Hospital Stay (HOSPITAL_COMMUNITY): Payer: 59

## 2013-06-10 MED ORDER — OXYCODONE-ACETAMINOPHEN 5-325 MG PO TABS: 1.0000 | ORAL_TABLET | ORAL | Status: DC | PRN

## 2013-06-10 NOTE — Discharge Summary (Signed)
  Obstetric Discharge Summary Reason for Admission: induction of labor Prenatal Procedures: none Intrapartum Procedures: spontaneous vaginal delivery Postpartum Procedures: none Complications-Operative and Postpartum: none  Hemoglobin  Date Value Range Status  06/09/2013 11.1* 12.0 - 15.0 g/dL Final  5/64/3329 51.8   Final  09/20/2012 13.0  12.2 - 16.2 g/dL Final     HCT  Date Value Range Status  06/09/2013 33.3* 36.0 - 46.0 % Final  10/13/2012 36   Final     HCT, POC  Date Value Range Status  09/20/2012 41.0  37.7 - 47.9 % Final    Physical Exam:  General: alert Lochia: appropriate Uterine: firm Incision: n/a DVT Evaluation: No evidence of DVT seen on physical exam.  Discharge Diagnoses: Active Problems:   Oligohydramnios in third trimester   Normal delivery   Discharge Information: Date: 06/10/2013 Activity: pelvic rest Diet: routine Medications:  Prior to Admission medications   Medication Sig Start Date End Date Taking? Authorizing Provider  calcium carbonate (TUMS - DOSED IN MG ELEMENTAL CALCIUM) 500 MG chewable tablet Chew 2 tablets by mouth 3 (three) times daily as needed for heartburn.   Yes Historical Provider, MD  ferrous sulfate 325 (65 FE) MG tablet Take 325 mg by mouth daily with breakfast.   Yes Historical Provider, MD  Prenatal Vit-Fe Fumarate-FA (PRENATAL MULTIVITAMIN) TABS tablet Take 1 tablet by mouth daily at 12 noon.   Yes Historical Provider, MD  oxyCODONE-acetaminophen (PERCOCET/ROXICET) 5-325 MG per tablet Take 1-2 tablets by mouth every 3 (three) hours as needed. 06/10/13   Antionette Char, MD    Condition: stable Instructions: refer to routine discharge instructions Discharge to: home Follow-up Information   Follow up with Antionette Char A, MD. Schedule an appointment as soon as possible for a visit in 2 weeks.   Specialty:  Obstetrics and Gynecology   Contact information:   [address] [place]  [address] [phone]       Newborn Data:  Live born female  Birth Weight: 7 lb 13.2 oz (3549 g) APGAR: 8, 9   Home with mother.  JACKSON-MOORE,Moo Gravley A 06/10/2013, 10:08 AM

## 2013-06-22 ENCOUNTER — Encounter: Payer: Self-pay | Admitting: Obstetrics & Gynecology

## 2013-06-22 ENCOUNTER — Ambulatory Visit (INDEPENDENT_AMBULATORY_CARE_PROVIDER_SITE_OTHER): Payer: 59 | Admitting: Obstetrics & Gynecology

## 2013-06-22 NOTE — Progress Notes (Signed)
Subjective:     Gina Chung is a 37 y.o. female who presents for a postpartum visit. She is 2 weeks postpartum following a spontaneous vaginal delivery. I have fully reviewed the prenatal and intrapartum course. The delivery was at 40.4 gestational weeks. Outcome: spontaneous vaginal delivery. Anesthesia: epidural. Postpartum course has been normal. Baby's course has been normal. Baby is feeding by breast. Bleeding thin lochia. Bowel function is normal. Bladder function is normal. Patient is not sexually active. Contraception method is abstinence. Patient would like to use the Nexplanon. Postpartum depression screening: negative.  The following portions of the patient's history were reviewed and updated as appropriate: allergies, current medications, past family history, past medical history, past social history, past surgical history and problem list.  Review of Systems Pertinent items are noted in HPI.   Objective:    BP 115/82  Pulse 76  Temp(Src) 98.6 F (37 C)  Wt 184 lb (83.462 kg)  BMI 33.65 kg/m2  LMP 08/27/2012  Breastfeeding? Yes        Assessment:   Doing well.  Plan:   Return for Nexplanon insertion

## 2013-06-25 ENCOUNTER — Encounter: Payer: Self-pay | Admitting: Obstetrics & Gynecology

## 2013-06-25 NOTE — Patient Instructions (Signed)

## 2013-07-24 ENCOUNTER — Ambulatory Visit (INDEPENDENT_AMBULATORY_CARE_PROVIDER_SITE_OTHER): Payer: 59 | Admitting: Obstetrics & Gynecology

## 2013-07-24 ENCOUNTER — Encounter: Payer: Self-pay | Admitting: Obstetrics & Gynecology

## 2013-07-24 VITALS — BP 130/85 | HR 73 | Temp 98.0°F | Ht 62.0 in | Wt 183.0 lb

## 2013-07-24 DIAGNOSIS — Z3202 Encounter for pregnancy test, result negative: Secondary | ICD-10-CM

## 2013-07-24 DIAGNOSIS — IMO0001 Reserved for inherently not codable concepts without codable children: Secondary | ICD-10-CM

## 2013-07-24 DIAGNOSIS — Z30017 Encounter for initial prescription of implantable subdermal contraceptive: Secondary | ICD-10-CM

## 2013-07-24 LAB — POCT URINE PREGNANCY: Preg Test, Ur: NEGATIVE

## 2013-07-24 NOTE — Progress Notes (Signed)
NEXPLANON INSERTION NOTE  Date of LMP:   06/20/2013  Contraception used:abstinence none  Pregnancy test result:  Lab Results  Component Value Date   PREGTESTUR NEGATIVE 07/16/2012   HCG 10437.0 01/04/2013    Indications:  The patient desires contraception.  She understands risks, benefits, and alternatives to Implanon and would like to proceed.  Anesthesia:     Procedure:  A time-out was performed confirming the procedure and the patient's allergy status.  The patient's non-dominant was identified as the left arm.  The protection cap was removed. While placing countertraction on the skin, the needle was inserted at a 30 degree angle.  The applicator was held horizontal to the skin; the skin was tented upward as the needle was introduced into the subdermal space.  While holding the applicator in place, the slider was unlocked. The Nexplanon was removed from the field.  The Nexplanon was palpated to ensure proper placement.  Complications: None  Instructions:  The patient was instructed to remove the dressing in 24 hours and that some bruising is to be expected.  She was advised to use over the counter analgesics as needed for any pain at the site.  She is to keep the area dry for 24 hours and to call if her hand or arm becomes cold, numb, or blue.  Return visit:  Return in 6+ weeks

## 2013-09-04 ENCOUNTER — Ambulatory Visit: Payer: 59 | Admitting: Obstetrics & Gynecology

## 2013-09-06 ENCOUNTER — Ambulatory Visit: Payer: 59 | Admitting: Obstetrics & Gynecology

## 2014-05-14 ENCOUNTER — Ambulatory Visit (INDEPENDENT_AMBULATORY_CARE_PROVIDER_SITE_OTHER): Payer: 59 | Admitting: Obstetrics & Gynecology

## 2014-05-14 VITALS — BP 114/72 | HR 87 | Temp 98.3°F | Wt 177.0 lb

## 2014-05-14 DIAGNOSIS — N926 Irregular menstruation, unspecified: Secondary | ICD-10-CM

## 2014-05-14 DIAGNOSIS — N939 Abnormal uterine and vaginal bleeding, unspecified: Secondary | ICD-10-CM

## 2014-05-14 LAB — HEMOGLOBIN AND HEMATOCRIT, BLOOD
HEMATOCRIT: 36 % (ref 36.0–46.0)
Hemoglobin: 11.9 g/dL — ABNORMAL LOW (ref 12.0–15.0)

## 2014-05-14 LAB — POCT URINE PREGNANCY: PREG TEST UR: NEGATIVE

## 2014-05-14 MED ORDER — MEFENAMIC ACID 250 MG PO CAPS: 1.0000 | ORAL_CAPSULE | Freq: Three times a day (TID) | ORAL | Status: DC

## 2014-05-15 ENCOUNTER — Encounter: Payer: Self-pay | Admitting: Obstetrics & Gynecology

## 2014-05-15 LAB — GC/CHLAMYDIA PROBE AMP
CT Probe RNA: NEGATIVE
GC Probe RNA: NEGATIVE

## 2014-05-15 NOTE — Progress Notes (Signed)
Gina Chung is a 38 y.o.who presents for irregular menses. She reports a current prolonged episode for 1 month.  Patient Active Problem List   Diagnosis Date Noted  . Normal delivery 06/09/2013  . Oligohydramnios in third trimester 06/08/2013  . Supervision of other normal pregnancy 12/08/2012   Past Medical History  Diagnosis Date  . Migraine   . History of UTI   . Language barrier     arabic and english  . Anemia     Past Surgical History  Procedure Laterality Date  . Colposcopy      Current outpatient prescriptions:Prenatal Vit-Fe Fumarate-FA (PRENATAL MULTIVITAMIN) TABS tablet, Take 1 tablet by mouth daily at 12 noon., Disp: , Rfl: ;  Mefenamic Acid 250 MG CAPS, Take 1 capsule (250 mg total) by mouth 3 (three) times daily., Disp: 21 each, Rfl: 0 No Known Allergies  History  Substance Use Topics  . Smoking status: Never Smoker   . Smokeless tobacco: Never Used  . Alcohol Use: No    Family History  Problem Relation Age of Onset  . Rheum arthritis Mother   . Hypertension Father      Review of Systems Constitutional: negative for fatigue and weight loss Respiratory: negative for cough and wheezing Cardiovascular: negative for chest pain, fatigue and palpitations Gastrointestinal: negative for abdominal pain and change in bowel habits Genitourinary:negative for abnormal vaginal discharge Integument/breast: negative for nipple discharge Musculoskeletal:negative for myalgias Neurological: negative for gait problems and tremors Behavioral/Psych: negative for abusive relationship, depression Endocrine: negative for temperature intolerance     Lab Review Urine pregnancy test Labs reviewed no Radiologic studies reviewed no  Objective:  BP 114/72  Pulse 87  Temp(Src) 98.3 F (36.8 C)  Wt 80.287 kg (177 lb)  LMP 04/13/2014 General:   alert  Skin:   no rash or abnormalities  Lungs:   clear to auscultation bilaterally  Heart:   regular rate and rhythm, S1, S2  normal, no murmur, click, rub or gallop  Abdomen:  normal findings: no organomegaly, soft, non-tender and no hernia  Pelvis:  External genitalia: normal general appearance Urinary system: urethral meatus normal and bladder without fullness, nontender Vaginal: normal without tenderness, induration or masses Cervix: normal appearance Adnexa: normal bimanual exam Uterus: anteverted and non-tender, normal size    Assessment:   Unscheduled bleed with Nexplanon   Plan:     Orders Placed This Encounter  Procedures  . GC/Chlamydia Probe Amp  . Hemoglobin and hematocrit, blood  . POCT urine pregnancy     Meds ordered this encounter  Medications  . Mefenamic Acid 250 MG CAPS    Sig: Take 1 capsule (250 mg total) by mouth 3 (three) times daily.    Dispense:  21 each    Refill:  0   Orders Placed This Encounter  Procedures  . GC/Chlamydia Probe Amp  . Hemoglobin and hematocrit, blood  . POCT urine pregnancy    Possible management options include: Nexplanon removal Follow up as needed.

## 2014-06-29 ENCOUNTER — Other Ambulatory Visit: Payer: Self-pay

## 2014-07-13 ENCOUNTER — Ambulatory Visit (INDEPENDENT_AMBULATORY_CARE_PROVIDER_SITE_OTHER): Payer: 59 | Admitting: Obstetrics & Gynecology

## 2014-07-13 VITALS — BP 130/84 | HR 76 | Temp 97.0°F | Wt 180.0 lb

## 2014-07-13 DIAGNOSIS — Z3040 Encounter for surveillance of contraceptives, unspecified: Secondary | ICD-10-CM

## 2014-07-13 DIAGNOSIS — Z113 Encounter for screening for infections with a predominantly sexual mode of transmission: Secondary | ICD-10-CM

## 2014-07-13 LAB — POCT URINE PREGNANCY: PREG TEST UR: NEGATIVE

## 2014-07-13 MED ORDER — NORETHIN ACE-ETH ESTRAD-FE 1-20 MG-MCG(24) PO TABS: 1.0000 | ORAL_TABLET | Freq: Every day | ORAL | Status: DC

## 2014-07-16 ENCOUNTER — Encounter: Payer: Self-pay | Admitting: Obstetrics & Gynecology

## 2014-07-16 LAB — GC/CHLAMYDIA PROBE AMP
CT PROBE, AMP APTIMA: NEGATIVE
GC Probe RNA: NEGATIVE

## 2014-08-01 ENCOUNTER — Encounter: Payer: Self-pay | Admitting: Obstetrics & Gynecology

## 2014-08-01 NOTE — Progress Notes (Signed)
Patient ID: Gina Chung, female   DOB: 03/24/1976, 38 y.o.   MRN: [id number]  Chief Complaint  Patient presents with  . Menstrual Problem    bleeding with Nexplanon    HPI Gina Chung is a 38 y.o. female.   HPI  Past Medical History  Diagnosis Date  . Migraine   . History of UTI   . Language barrier     arabic and english  . Anemia     Past Surgical History  Procedure Laterality Date  . Colposcopy      Family History  Problem Relation Age of Onset  . Rheum arthritis Mother   . Hypertension Father     Social History History  Substance Use Topics  . Smoking status: Never Smoker   . Smokeless tobacco: Never Used  . Alcohol Use: No    No Known Allergies  Current Outpatient Prescriptions  Medication Sig Dispense Refill  . Mefenamic Acid 250 MG CAPS Take 1 capsule (250 mg total) by mouth 3 (three) times daily. 21 each 0  . Norethindrone Acetate-Ethinyl Estrad-FE (LOESTRIN 24 FE) 1-20 MG-MCG(24) tablet Take 1 tablet by mouth daily. 1 Package 11  . Prenatal Vit-Fe Fumarate-FA (PRENATAL MULTIVITAMIN) TABS tablet Take 1 tablet by mouth daily at 12 noon.     No current facility-administered medications for this visit.    Review of Systems Review of Systems Constitutional: negative for fatigue and weight loss Respiratory: negative for cough and wheezing Cardiovascular: negative for chest pain, fatigue and palpitations Gastrointestinal: negative for abdominal pain and change in bowel habits Genitourinary: positive for abnormal uterine bleeding Integument/breast: negative for nipple discharge Musculoskeletal:negative for myalgias Neurological: negative for gait problems and tremors Behavioral/Psych: negative for abusive relationship, depression Endocrine: negative for temperature intolerance     Blood pressure 130/84, pulse 76, temperature 97 F (36.1 C), weight 81.647 kg (180 lb), last menstrual period 06/22/2014, currently breastfeeding.  Physical  Exam Physical Exam General:   alert  Skin:   no rash or abnormalities  Lungs:   clear to auscultation bilaterally  Heart:   regular rate and rhythm, S1, S2 normal, no murmur, click, rub or gallop  Abdomen:  normal findings: no organomegaly, soft, non-tender and no hernia  Pelvis:  External genitalia: normal general appearance Urinary system: urethral meatus normal and bladder without fullness, nontender Vaginal: normal without tenderness, induration or masses Cervix: normal appearance Adnexa: normal bimanual exam Uterus: anteverted and non-tender, normal size      Data Reviewed None  Assessment    Unscheduled bleeding with Nexplanon     Plan    Orders Placed This Encounter  Procedures  . GC/Chlamydia Probe Amp  . POCT urine pregnancy   Meds ordered this encounter  Medications  . Norethindrone Acetate-Ethinyl Estrad-FE (LOESTRIN 24 FE) 1-20 MG-MCG(24) tablet    Sig: Take 1 tablet by mouth daily.    Dispense:  1 Package    Refill:  11    Follow up as needed.         JACKSON-MOORE,Destynee Stringfellow A 08/01/2014, 1:16 PM

## 2014-09-10 ENCOUNTER — Encounter: Payer: Self-pay | Admitting: *Deleted

## 2014-09-11 ENCOUNTER — Encounter: Payer: Self-pay | Admitting: Obstetrics & Gynecology

## 2014-10-24 ENCOUNTER — Ambulatory Visit (INDEPENDENT_AMBULATORY_CARE_PROVIDER_SITE_OTHER): Payer: 59 | Admitting: Emergency Medicine

## 2014-10-24 VITALS — BP 110/70 | HR 82 | Temp 98.1°F | Resp 16 | Ht 63.0 in | Wt 190.0 lb

## 2014-10-24 DIAGNOSIS — R102 Pelvic and perineal pain: Secondary | ICD-10-CM

## 2014-10-24 DIAGNOSIS — R350 Frequency of micturition: Secondary | ICD-10-CM

## 2014-10-24 DIAGNOSIS — E559 Vitamin D deficiency, unspecified: Secondary | ICD-10-CM

## 2014-10-24 DIAGNOSIS — R109 Unspecified abdominal pain: Secondary | ICD-10-CM

## 2014-10-24 DIAGNOSIS — M7989 Other specified soft tissue disorders: Secondary | ICD-10-CM

## 2014-10-24 DIAGNOSIS — N9489 Other specified conditions associated with female genital organs and menstrual cycle: Secondary | ICD-10-CM

## 2014-10-24 LAB — POCT URINALYSIS DIPSTICK
Bilirubin, UA: NEGATIVE
Glucose, UA: NEGATIVE
Ketones, UA: NEGATIVE
Leukocytes, UA: NEGATIVE
Nitrite, UA: NEGATIVE
Protein, UA: NEGATIVE
Spec Grav, UA: 1.015
UROBILINOGEN UA: 0.2
pH, UA: 7

## 2014-10-24 LAB — POCT UA - MICROSCOPIC ONLY
Bacteria, U Microscopic: NEGATIVE
CRYSTALS, UR, HPF, POC: NEGATIVE
Casts, Ur, LPF, POC: NEGATIVE
Mucus, UA: NEGATIVE
Yeast, UA: NEGATIVE

## 2014-10-24 LAB — POCT CBC
GRANULOCYTE PERCENT: 43.1 % (ref 37–80)
HCT, POC: 37 % — AB (ref 37.7–47.9)
Hemoglobin: 12.2 g/dL (ref 12.2–16.2)
Lymph, poc: 3.6 — AB (ref 0.6–3.4)
MCH, POC: 29 pg (ref 27–31.2)
MCHC: 33 g/dL (ref 31.8–35.4)
MCV: 88.1 fL (ref 80–97)
MID (cbc): 0.4 (ref 0–0.9)
MPV: 7.9 fL (ref 0–99.8)
POC Granulocyte: 3 (ref 2–6.9)
POC LYMPH PERCENT: 51.3 %L — AB (ref 10–50)
POC MID %: 5.6 %M (ref 0–12)
Platelet Count, POC: 325 10*3/uL (ref 142–424)
RBC: 4.2 M/uL (ref 4.04–5.48)
RDW, POC: 13.8 %
WBC: 7 10*3/uL (ref 4.6–10.2)

## 2014-10-24 LAB — POCT URINE PREGNANCY: Preg Test, Ur: NEGATIVE

## 2014-10-24 MED ORDER — PHENAZOPYRIDINE HCL 95 MG PO TABS: 95.0000 mg | ORAL_TABLET | Freq: Three times a day (TID) | ORAL | Status: DC | PRN

## 2014-10-24 NOTE — Progress Notes (Addendum)
Subjective:    Patient ID: Gina Chung, female    DOB: [birth year], 39 y.o.   MRN: [id number] This chart was scribed for Lesle Chris, MD by Jolene Provost, Medical Scribe. This patient was seen in Room 4 and the patient's care was started at 12:19 PM.  Chief Complaint  Patient presents with  . Flank Pain    x 4 days  . Urinary Frequency    x 4 days  . Fever    at night    HPI HPI Comments: Gina Chung is a 39 y.o. female who presents to [facility] complaining of fever at night with associated mild flank pain for the last four days. Pt denies dysuria, frequency, urgency, and chills. Pt also states she has noticed swelling in her hands in the morning. Pt states she had a past hx of kidney infection during pregnancy. Pt states her period will start in one week. Pt denies hx of thyroid issues. Pt states she has had low vitamin D in the past.   Review of Systems  Constitutional: Positive for fever and chills.  HENT: Negative for congestion.   Respiratory: Negative for cough.   Genitourinary: Positive for flank pain.  Musculoskeletal: Negative for myalgias.       Objective:   Physical Exam  Constitutional: She is oriented to person, place, and time. She appears well-developed and well-nourished.  Pt is not ill appearing, nontoxic.   HENT:  Head: Normocephalic and atraumatic.  Eyes: Pupils are equal, round, and reactive to light.  Neck: Neck supple.  Cardiovascular: Normal rate, regular rhythm and normal heart sounds.   No murmur heard. Pulmonary/Chest: Effort normal and breath sounds normal. No respiratory distress.  Abdominal: Soft. There is tenderness.  Mild suprapubic tenderness. No adnexal pain.   Neurological: She is alert and oriented to person, place, and time.  Skin: Skin is warm and dry.  Psychiatric: She has a normal mood and affect. Her behavior is normal.  Nursing note and vitals reviewed.  Results for orders placed or performed in visit on 10/24/14  POCT urinalysis  dipstick  Result Value Ref Range   Color, UA yellow    Clarity, UA clear    Glucose, UA neg    Bilirubin, UA neg    Ketones, UA neg    Spec Grav, UA 1.015    Blood, UA trace-intact    pH, UA 7.0    Protein, UA neg    Urobilinogen, UA 0.2    Nitrite, UA neg    Leukocytes, UA Negative   POCT UA - Microscopic Only  Result Value Ref Range   WBC, Ur, HPF, POC 0-1    RBC, urine, microscopic 0-1    Bacteria, U Microscopic neg    Mucus, UA neg    Epithelial cells, urine per micros 1-7    Crystals, Ur, HPF, POC neg    Casts, Ur, LPF, POC neg    Yeast, UA neg    Renal tubular cells    POCT CBC  Result Value Ref Range   WBC 7.0 4.6 - 10.2 K/uL   Lymph, poc 3.6 (A) 0.6 - 3.4   POC LYMPH PERCENT 51.3 (A) 10 - 50 %L   MID (cbc) 0.4 0 - 0.9   POC MID % 5.6 0 - 12 %M   POC Granulocyte 3.0 2 - 6.9   Granulocyte percent 43.1 37 - 80 %G   RBC 4.20 4.04 - 5.48 M/uL   Hemoglobin 12.2 12.2 -  16.2 g/dL   HCT, POC 16.137.0 (A) 09.637.7 - 47.9 %   MCV 88.1 80 - 97 fL   MCH, POC 29.0 27 - 31.2 pg   MCHC 33.0 31.8 - 35.4 g/dL   RDW, POC 04.513.8 %   Platelet Count, POC 325 142 - 424 K/uL   MPV 7.9 0 - 99.8 fL  POCT urine pregnancy  Result Value Ref Range   Preg Test, Ur Negative       Assessment & Plan:  Patient does not appear ill. Her initial testing is completely normal. She has a normal white count her urine does not appear infected. I did send a urine culture to be sure. She can take Azo-Standard as needed for dysuria and burning.I personally performed the services described in this documentation, which was scribed in my presence. The recorded information has been reviewed and is accurate.

## 2014-10-24 NOTE — Patient Instructions (Signed)

## 2014-10-25 LAB — BASIC METABOLIC PANEL WITH GFR
BUN: 8 mg/dL (ref 6–23)
CHLORIDE: 103 meq/L (ref 96–112)
CO2: 25 meq/L (ref 19–32)
CREATININE: 0.56 mg/dL (ref 0.50–1.10)
Calcium: 9.4 mg/dL (ref 8.4–10.5)
GFR, Est African American: >89 mL/min
GFR, Est Non African American: >89 mL/min
Glucose, Bld: 90 mg/dL (ref 70–99)
Potassium: 4.5 mEq/L (ref 3.5–5.3)
Sodium: 136 mEq/L (ref 135–145)

## 2014-10-25 LAB — TSH: TSH: 3.334 u[IU]/mL (ref 0.350–4.500)

## 2014-10-25 LAB — T4, FREE: Free T4: 1.04 ng/dL (ref 0.80–1.80)

## 2014-10-25 LAB — VITAMIN D 25 HYDROXY (VIT D DEFICIENCY, FRACTURES): Vit D, 25-Hydroxy: 11 ng/mL — ABNORMAL LOW (ref 30–100)

## 2014-10-26 LAB — URINE CULTURE
COLONY COUNT: NO GROWTH
Organism ID, Bacteria: NO GROWTH

## 2014-11-01 ENCOUNTER — Telehealth: Payer: Self-pay | Admitting: *Deleted

## 2014-11-01 DIAGNOSIS — Z3041 Encounter for surveillance of contraceptive pills: Secondary | ICD-10-CM

## 2014-11-01 MED ORDER — NORETHINDRONE ACET-ETHINYL EST 1-20 MG-MCG PO TABS: 1.0000 | ORAL_TABLET | Freq: Every day | ORAL | Status: DC

## 2014-11-01 NOTE — Telephone Encounter (Signed)
Patient called to let us know that she has had a change in her insurance and her copay for her birth control is now over $100. Can we switch her to another that is like the one she is taking. Patient informed that we can change her to Lo Estrin 1/20- but she will have a week of WD pills at the end instead of the 3 days. She is fine with that.

## 2015-04-19 IMAGING — CR DG CHEST 2V
2 series · 2 of 2 positions shown · non-contrast
Comparison: 09/20/2012

CLINICAL DATA: 34 weeks pregnant.  Shortness of breath.

CHEST - 2 VIEW

[view not recorded (1 of 2)]
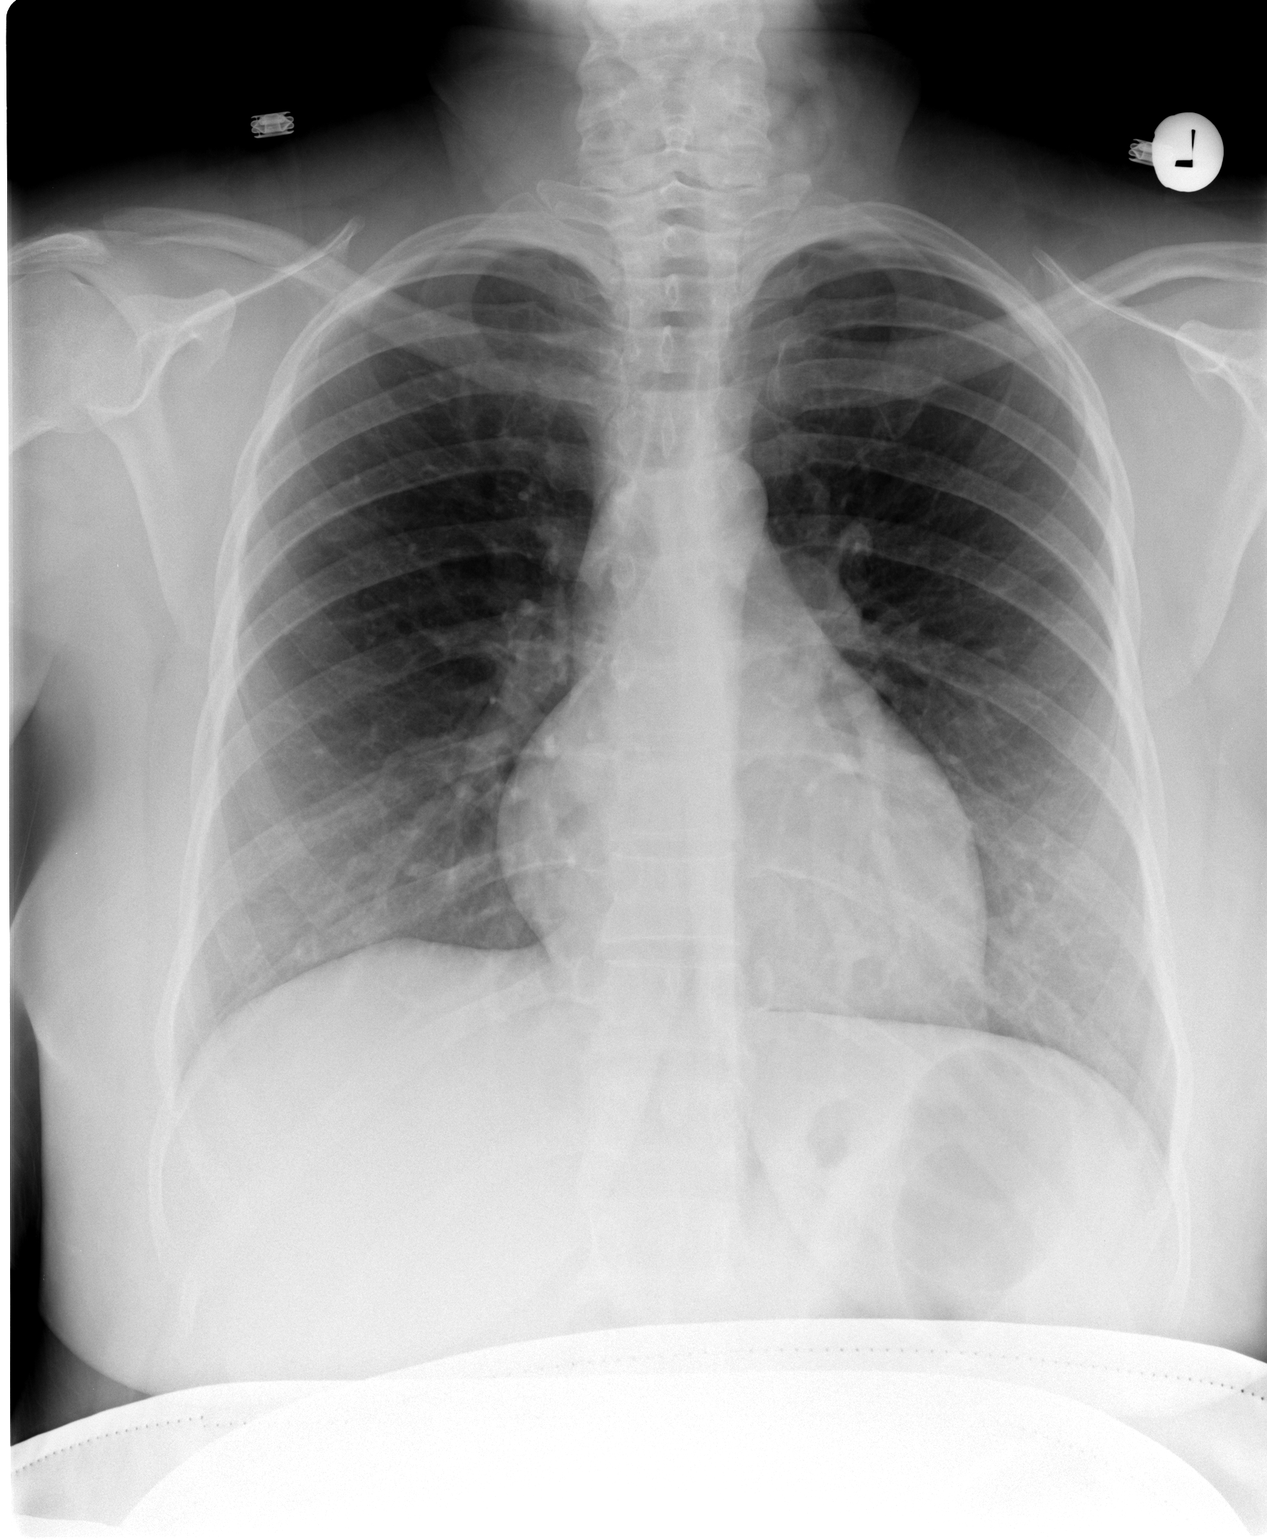

[view not recorded (2 of 2)]
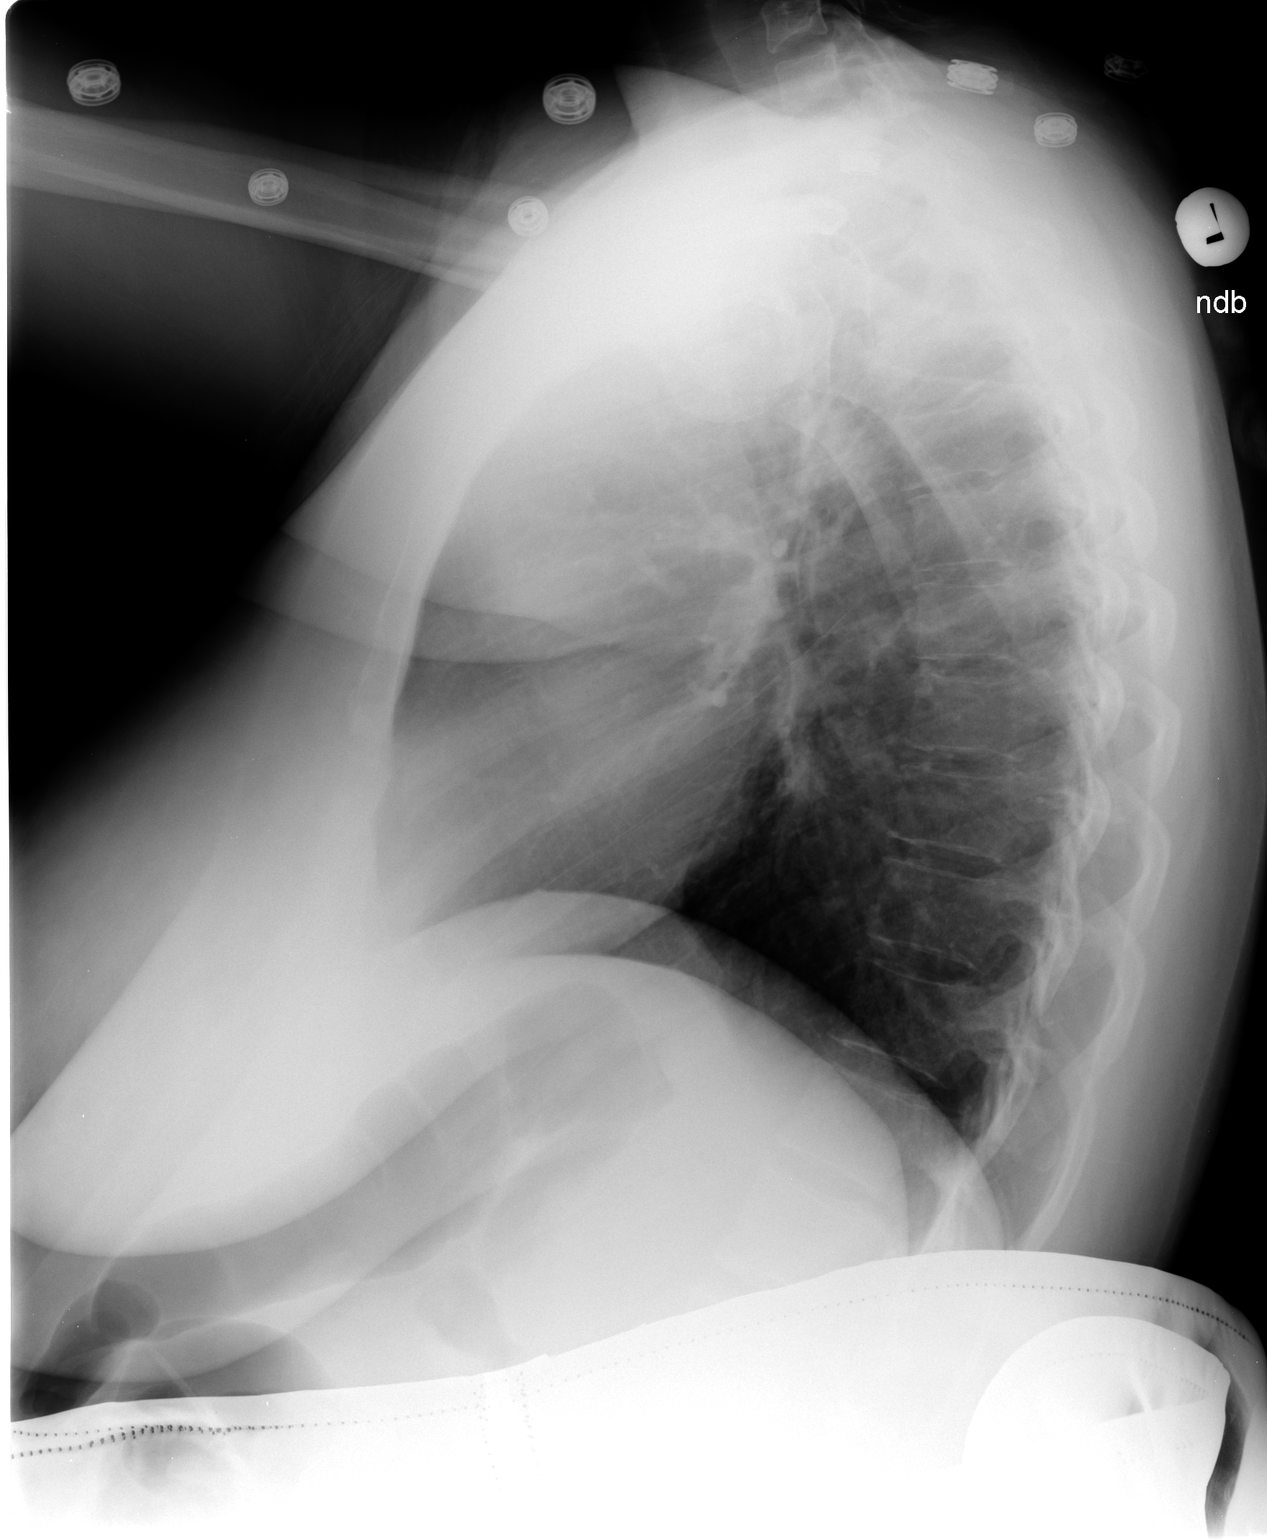

[2 of 2 positions shown; findings below may reference images not displayed]

FINDINGS: Patient shielded for this examination.  Heart size is
normal.  Mediastinal shadows are normal.  Lungs are clear.  No
effusions.  No bony abnormalities.
IMPRESSION: Normal chest

## 2015-09-03 ENCOUNTER — Ambulatory Visit (INDEPENDENT_AMBULATORY_CARE_PROVIDER_SITE_OTHER): Payer: 59 | Admitting: Physician Assistant

## 2015-09-03 VITALS — BP 118/80 | HR 76 | Temp 97.6°F | Resp 16 | Ht 62.0 in | Wt 167.0 lb

## 2015-09-03 DIAGNOSIS — N3090 Cystitis, unspecified without hematuria: Secondary | ICD-10-CM | POA: Diagnosis not present

## 2015-09-03 DIAGNOSIS — L749 Eccrine sweat disorder, unspecified: Secondary | ICD-10-CM

## 2015-09-03 DIAGNOSIS — L750 Bromhidrosis: Secondary | ICD-10-CM

## 2015-09-03 DIAGNOSIS — N898 Other specified noninflammatory disorders of vagina: Secondary | ICD-10-CM | POA: Diagnosis not present

## 2015-09-03 LAB — POCT URINALYSIS DIP (MANUAL ENTRY)
BILIRUBIN UA: NEGATIVE
Glucose, UA: NEGATIVE
Ketones, POC UA: NEGATIVE
Nitrite, UA: NEGATIVE
PH UA: 6
Protein Ur, POC: NEGATIVE
Spec Grav, UA: <=1.005
Urobilinogen, UA: 0.2

## 2015-09-03 LAB — POC MICROSCOPIC URINALYSIS (UMFC): MUCUS RE: ABSENT

## 2015-09-03 LAB — POCT WET + KOH PREP
Trich by wet prep: ABSENT
YEAST BY KOH: ABSENT
Yeast by wet prep: ABSENT

## 2015-09-03 LAB — POCT URINE PREGNANCY: PREG TEST UR: NEGATIVE

## 2015-09-03 MED ORDER — SULFAMETHOXAZOLE-TRIMETHOPRIM 800-160 MG PO TABS: 1.0000 | ORAL_TABLET | Freq: Two times a day (BID) | ORAL | Status: AC

## 2015-09-03 NOTE — Progress Notes (Signed)
[facility] [address], [place] [address] [phone]  Date:  09/03/2015   Name:  Gina Chung   DOB:  12/24/1975   MRN:  [id number]  PCP:  No PCP Per Patient    History of Present Illness:  Gina Chung is a 39 y.o. female patient who [facility] cc of urinary odor.  No urinary frequency, but she does have urgency with little volume.  No hematuria.  Some dysuria.  3 months since she had noticed.  Some suprapubic pain.  But does have some back pain.  No fever.  No nausea.  Some vaginal discharge that seems a little more in volume.      There are no active problems to display for this patient.   Past Medical History  Diagnosis Date  . Migraine   . History of UTI   . Language barrier     arabic and [place]  . Anemia     Past Surgical History  Procedure Laterality Date  . Colposcopy      Social History  Substance Use Topics  . Smoking status: Never Smoker   . Smokeless tobacco: Never Used  . Alcohol Use: No    Family History  Problem Relation Age of Onset  . Rheum arthritis Mother   . Hypertension Mother   . Hypertension Father   . Diabetes Maternal Grandfather     No Known Allergies  Medication list has been reviewed and updated.  No current outpatient prescriptions on file prior to visit.   No current facility-administered medications on file prior to visit.    ROS ROS otherwise unremarkable unless listed above.    Physical Examination: BP 118/80 mmHg  Pulse 76  Temp(Src) 97.6 F (36.4 C) (Oral)  Resp 16  Ht 5\' 2"  (1.575 m)  Wt 167 lb (75.751 kg)  BMI 30.54 kg/m2  SpO2 98% Ideal Body Weight: Weight in (lb) to have BMI = 25: 136.4  Physical Exam  Constitutional: She is oriented to person, place, and time. She appears well-developed and well-nourished. No distress.  HENT:  Head: Normocephalic and atraumatic.  Right Ear: External ear normal.  Left Ear: External ear normal.  Eyes: Conjunctivae and EOM are  normal. Pupils are equal, round, and reactive to light.  Cardiovascular: Normal rate.   Pulmonary/Chest: Effort normal. No respiratory distress.  Abdominal: Soft. Normal appearance and bowel sounds are normal. There is no hepatosplenomegaly. There is tenderness in the suprapubic area. There is no CVA tenderness.  Genitourinary: Pelvic exam was performed with patient supine. There is no rash or tenderness on the right labia. There is no rash or tenderness on the left labia. Cervix exhibits discharge (white and ). Cervix exhibits no motion tenderness and no friability. No erythema in the vagina. No signs of injury around the vagina. Vaginal discharge (white discharge) found.  Neurological: She is alert and oriented to person, place, and time.  Skin: She is not diaphoretic.  Psychiatric: She has a normal mood and affect. Her behavior is normal.    Results for orders placed or performed in visit on 09/03/15  POCT Microscopic Urinalysis ([facility])  Result Value Ref Range   WBC,UR,HPF,POC Few (A) None WBC/hpf   RBC,UR,HPF,POC Few (A) None RBC/hpf   Bacteria Few (A) None, Too numerous to count   Mucus Absent Absent   Epithelial Cells, UR Per Microscopy Few (A) None, Too numerous to count cells/hpf  POCT urinalysis dipstick  Result Value Ref  Range   Color, UA yellow yellow   Clarity, UA clear clear   Glucose, UA negative negative   Bilirubin, UA negative negative   Ketones, POC UA negative negative   Spec Grav, UA <=1.005    Blood, UA small (A) negative   pH, UA 6.0    Protein Ur, POC negative negative   Urobilinogen, UA 0.2    Nitrite, UA Negative Negative   Leukocytes, UA Trace (A) Negative  POCT Wet + KOH Prep  Result Value Ref Range   Yeast by KOH Absent Present, Absent   Yeast by wet prep Absent Present, Absent   WBC by wet prep None None, Few, Too numerous to count   Clue Cells Wet Prep HPF POC None None, Too numerous to count   Trich by wet prep Absent Present, Absent   Bacteria Wet  Prep HPF POC Few None, Few, Too numerous to count   Epithelial Cells By [employer] ([facility]) Moderate (A) None, Few, Too numerous to count   RBC,UR,HPF,POC Few (A) None RBC/hpf  POCT urine pregnancy  Result Value Ref Range   Preg Test, Ur Negative Negative    Assessment and Plan: Gina Chung is a 39 y.o. female who is here today for cc of urinary odor.   -starting the bactrim.  Advised will contact if urine culture is negative we will discontinue, and search for other modalities of this non acute visit.Marland Kitchen    Urinary body odor - Plan: POCT Microscopic Urinalysis ([facility]), POCT urinalysis dipstick  Vaginal discharge - Plan: POCT Wet + KOH Prep, POCT urine pregnancy   Trena Platt, PA-C [facility] 09/03/2015 9:01 AM

## 2015-09-03 NOTE — Patient Instructions (Signed)
This is likely a urinary tract infection.  I would like you to take antibiotic.  I will contact you with the results of the urine culture.   Urinary Tract Infection Urinary tract infections (UTIs) can develop anywhere along your urinary tract. Your urinary tract is your body's drainage system for removing wastes and extra water. Your urinary tract includes two kidneys, two ureters, a bladder, and a urethra. Your kidneys are a pair of bean-shaped organs. Each kidney is about the size of your fist. They are located below your ribs, one on each side of your spine. CAUSES Infections are caused by microbes, which are microscopic organisms, including fungi, viruses, and bacteria. These organisms are so small that they can only be seen through a microscope. Bacteria are the microbes that most commonly cause UTIs. SYMPTOMS  Symptoms of UTIs may vary by age and gender of the patient and by the location of the infection. Symptoms in young women typically include a frequent and intense urge to urinate and a painful, burning feeling in the bladder or urethra during urination. Older women and men are more likely to be tired, shaky, and weak and have muscle aches and abdominal pain. A fever may mean the infection is in your kidneys. Other symptoms of a kidney infection include pain in your back or sides below the ribs, nausea, and vomiting. DIAGNOSIS To diagnose a UTI, your caregiver will ask you about your symptoms. Your caregiver will also ask you to provide a urine sample. The urine sample will be tested for bacteria and white blood cells. White blood cells are made by your body to help fight infection. TREATMENT  Typically, UTIs can be treated with medication. Because most UTIs are caused by a bacterial infection, they usually can be treated with the use of antibiotics. The choice of antibiotic and length of treatment depend on your symptoms and the type of bacteria causing your infection. HOME CARE  INSTRUCTIONS  If you were prescribed antibiotics, take them exactly as your caregiver instructs you. Finish the medication even if you feel better after you have only taken some of the medication.  Drink enough water and fluids to keep your urine clear or pale yellow.  Avoid caffeine, tea, and carbonated beverages. They tend to irritate your bladder.  Empty your bladder often. Avoid holding urine for long periods of time.  Empty your bladder before and after sexual intercourse.  After a bowel movement, women should cleanse from front to back. Use each tissue only once. SEEK MEDICAL CARE IF:   You have back pain.  You develop a fever.  Your symptoms do not begin to resolve within 3 days. SEEK IMMEDIATE MEDICAL CARE IF:   You have severe back pain or lower abdominal pain.  You develop chills.  You have nausea or vomiting.  You have continued burning or discomfort with urination. MAKE SURE YOU:   Understand these instructions.  Will watch your condition.  Will get help right away if you are not doing well or get worse.   This information is not intended to replace advice given to you by your health care provider. Make sure you discuss any questions you have with your health care provider.   Document Released: 06/10/2005 Document Revised: 05/22/2015 Document Reviewed: 10/09/2011 Elsevier Interactive Patient Education [employer]2016 Elsevier Inc.

## 2015-09-05 LAB — URINE CULTURE

## 2016-08-10 ENCOUNTER — Ambulatory Visit (INDEPENDENT_AMBULATORY_CARE_PROVIDER_SITE_OTHER): Payer: BLUE CROSS/BLUE SHIELD | Admitting: *Deleted

## 2016-08-10 DIAGNOSIS — N912 Amenorrhea, unspecified: Secondary | ICD-10-CM | POA: Diagnosis not present

## 2016-08-10 DIAGNOSIS — Z3201 Encounter for pregnancy test, result positive: Secondary | ICD-10-CM | POA: Diagnosis not present

## 2016-08-10 LAB — POCT URINE PREGNANCY: PREG TEST UR: POSITIVE — AB

## 2016-08-10 NOTE — Progress Notes (Signed)
2594w5d EDD 03/24/2017 Patient is using prenatal vitamins- needs to schedule NOB prn.

## 2016-09-03 ENCOUNTER — Ambulatory Visit (INDEPENDENT_AMBULATORY_CARE_PROVIDER_SITE_OTHER): Payer: BLUE CROSS/BLUE SHIELD | Admitting: Certified Nurse Midwife

## 2016-09-03 ENCOUNTER — Encounter: Payer: Self-pay | Admitting: Certified Nurse Midwife

## 2016-09-03 VITALS — BP 124/76 | HR 93 | Wt 174.0 lb

## 2016-09-03 DIAGNOSIS — R51 Headache: Secondary | ICD-10-CM

## 2016-09-03 DIAGNOSIS — Z1151 Encounter for screening for human papillomavirus (HPV): Secondary | ICD-10-CM | POA: Diagnosis not present

## 2016-09-03 DIAGNOSIS — Z789 Other specified health status: Secondary | ICD-10-CM | POA: Insufficient documentation

## 2016-09-03 DIAGNOSIS — O26891 Other specified pregnancy related conditions, first trimester: Secondary | ICD-10-CM

## 2016-09-03 DIAGNOSIS — Z124 Encounter for screening for malignant neoplasm of cervix: Secondary | ICD-10-CM | POA: Diagnosis not present

## 2016-09-03 DIAGNOSIS — O09529 Supervision of elderly multigravida, unspecified trimester: Secondary | ICD-10-CM | POA: Insufficient documentation

## 2016-09-03 DIAGNOSIS — IMO0002 Reserved for concepts with insufficient information to code with codable children: Secondary | ICD-10-CM

## 2016-09-03 DIAGNOSIS — Z3490 Encounter for supervision of normal pregnancy, unspecified, unspecified trimester: Secondary | ICD-10-CM

## 2016-09-03 MED ORDER — BUTALBITAL-APAP-CAFFEINE 50-325-40 MG PO TABS: 1.0000 | ORAL_TABLET | Freq: Four times a day (QID) | ORAL | 4 refills | Status: DC | PRN

## 2016-09-03 NOTE — Addendum Note (Signed)
Addended by: Dalphine HandingGARDNER, TYVONA L on: 09/03/2016 02:17 PM   Modules accepted: Orders

## 2016-09-03 NOTE — Progress Notes (Signed)
Subjective:    Gina Chung is being seen today for her first obstetrical visit.  This is a planned pregnancy. She is at [redacted]w[redacted]d gestation. Her obstetrical history is significant for advanced maternal age and last pregnancy complicated by oligiohydramnios. Relationship with FOB: spouse, living together. Patient does intend to breast feed. Pregnancy history fully reviewed.  Speaks english well.   The information documented in the HPI was reviewed and verified.  Menstrual History: OB History    Gravida Para Term Preterm AB Living   6 4 4  0 1 4   SAB TAB Ectopic Multiple Live Births   1 0 0 0 4     Vaginal deliveries.   Patient's last menstrual period was 06/17/2016.    Past Medical History:  Diagnosis Date  . Anemia   . History of UTI   . Language barrier    arabic and english  . Migraine     Past Surgical History:  Procedure Laterality Date  . COLPOSCOPY       (Not in a hospital admission) No Known Allergies  Social History  Substance Use Topics  . Smoking status: Never Smoker  . Smokeless tobacco: Never Used  . Alcohol use No    Family History  Problem Relation Age of Onset  . Rheum arthritis Mother   . Hypertension Mother   . Hypertension Father   . Diabetes Maternal Grandfather      Review of Systems Constitutional: negative for weight loss Gastrointestinal: negative for vomiting Genitourinary:negative for genital lesions and vaginal discharge and dysuria Musculoskeletal:negative for back pain Behavioral/Psych: negative for abusive relationship, depression, illegal drug usage and tobacco use    Objective:    BP 124/76   Pulse 93   Wt 174 lb (78.9 kg)   LMP 06/17/2016   BMI 31.83 kg/m  General Appearance:    Alert, cooperative, no distress, appears stated age  Head:    Normocephalic, without obvious abnormality, atraumatic  Eyes:    PERRL, conjunctiva/corneas clear, EOM's intact, fundi    benign, both eyes  Ears:    Normal TM's and external ear  canals, both ears  Nose:   Nares normal, septum midline, mucosa normal, no drainage    or sinus tenderness  Throat:   Lips, mucosa, and tongue normal; teeth and gums normal  Neck:   Supple, symmetrical, trachea midline, no adenopathy;    thyroid:  no enlargement/tenderness/nodules; no carotid   bruit or JVD  Back:     Symmetric, no curvature, ROM normal, no CVA tenderness  Lungs:     Clear to auscultation bilaterally, respirations unlabored  Chest Wall:    No tenderness or deformity   Heart:    Regular rate and rhythm, S1 and S2 normal, no murmur, rub   or gallop  Breast Exam:    No tenderness, masses, or nipple abnormality  Abdomen:     Soft, non-tender, bowel sounds active all four quadrants,    no masses, no organomegaly  Genitalia:    Normal female without lesion, discharge or tenderness  Extremities:   Extremities normal, atraumatic, no cyanosis or edema  Pulses:   2+ and symmetric all extremities  Skin:   Skin color, texture, turgor normal, no rashes or lesions  Lymph nodes:   Cervical, supraclavicular, and axillary nodes normal  Neurologic:   CNII-XII intact, normal strength, sensation and reflexes    throughout          Cervix:  Long, thick, closed and posterior.  FHR: not obtained by doppler.  FH:    size less than symphysis pubis.      Lab Review Urine pregnancy test Labs reviewed yes Radiologic studies reviewed no Assessment:    Pregnancy at [redacted]w[redacted]d weeks   Nausea in early pregnancy  HA in early pregnancy  Plan:      Prenatal vitamins.  Counseling provided regarding continued use of seat belts, cessation of alcohol consumption, smoking or use of illicit drugs; infection precautions i.e., influenza/TDAP immunizations, toxoplasmosis,CMV, parvovirus, listeria and varicella; workplace safety, exercise during pregnancy; routine dental care, safe medications, sexual activity, hot tubs, saunas, pools, travel, caffeine use, fish and methlymercury, potential toxins, hair  treatments, varicose veins Weight gain recommendations per IOM guidelines reviewed: underweight/BMI< 18.5--> gain 28 - 40 lbs; normal weight/BMI 18.5 - 24.9--> gain 25 - 35 lbs; overweight/BMI 25 - 29.9--> gain 15 - 25 lbs; obese/BMI >30->gain  11 - 20 lbs Problem list reviewed and updated. FIRST/CF mutation testing/NIPT/QUAD SCREEN/fragile X/Ashkenazi Jewish population testing/Spinal muscular atrophy discussed: ordered. Role of ultrasound in pregnancy discussed; fetal survey: requested. Amniocentesis discussed: not indicated. VBAC calculator score: VBAC consent form provided Meds ordered this encounter  Medications  . Prenatal Vit-Fe Fumarate-FA (MULTIVITAMIN-PRENATAL) 27-0.8 MG TABS tablet    Sig: Take 1 tablet by mouth daily at 12 noon.  . butalbital-acetaminophen-caffeine (FIORICET, ESGIC) 50-325-40 MG tablet    Sig: Take 1-2 tablets by mouth every 6 (six) hours as needed for headache.    Dispense:  45 tablet    Refill:  4   Orders Placed This Encounter  Procedures  . Culture, OB Urine  . Cystic Fibrosis Mutation 97  . ToxASSURE Select 13 (MW), Urine  . TSH  . Prenatal Profile  . Hemoglobinopathy evaluation  . Varicella zoster antibody, IgG  . NuSwab Vaginitis Plus (VG+)  . Hemoglobin A1c  . MaterniT21 PLUS Core+SCA    Order Specific Question:   Is the patient insulin dependent?    Answer:   No    Order Specific Question:   Please enter gestational age. This should be expressed as weeks AND days, i.e. 16w 6d. Enter weeks here. Enter days in next question.    Answer:   4211    Order Specific Question:   Please enter gestational age. This should be expressed as weeks AND days, i.e. 16w 6d. Enter days here. Enter weeks in previous question.    Answer:   1    Order Specific Question:   How was gestational age calculated?    Answer:   LMP    Order Specific Question:   Please give the date of LMP OR Ultrasound OR Estimated date of delivery.    Answer:   03/24/2017    Order Specific  Question:   Number of Fetuses (Type of Pregnancy):    Answer:   1    Order Specific Question:   Indications for performing the test? (please choose all that apply):    Answer:   Routine screening    Order Specific Question:   Other Indications? (Y=Yes, N=No)    Answer:   Y    Order Specific Question:   Please specify other indications, if any:    Answer:   AMA    Order Specific Question:   If this is a repeat specimen, please indicate the reason:    Answer:   Not indicated    Order Specific Question:   Please specify the patient's race: (C=White/Caucasion, B=Black, I=Native American, A=Asian, H=Hispanic, O=Other, U=Unknown)  Answer:   O    Order Specific Question:   Donor Egg - indicate if the egg was obtained from in vitro fertilization.    Answer:   N    Order Specific Question:   Age of Egg Donor.    Answer:   [age]    Order Specific Question:   Prior Down Syndrome/ONTD screening during current pregnancy.    Answer:   N    Order Specific Question:   Prior First Trimester Testing    Answer:   N    Order Specific Question:   Prior Second Trimester Testing    Answer:   N    Order Specific Question:   Family History of Neural Tube Defects    Answer:   N    Order Specific Question:   Prior Pregnancy with Down Syndrome    Answer:   N    Order Specific Question:   Please give the patient's weight (in pounds)    Answer:   174    Follow up in 4 weeks. 50% of 30 min visit spent on counseling and coordination of care.

## 2016-09-03 NOTE — Addendum Note (Signed)
Addended by: Samantha CrimesENNEY, RACHELLE ANNE on: 09/03/2016 02:56 PM   Modules accepted: Orders

## 2016-09-04 ENCOUNTER — Ambulatory Visit: Payer: BLUE CROSS/BLUE SHIELD

## 2016-09-04 ENCOUNTER — Ambulatory Visit (INDEPENDENT_AMBULATORY_CARE_PROVIDER_SITE_OTHER): Payer: BLUE CROSS/BLUE SHIELD | Admitting: Certified Nurse Midwife

## 2016-09-04 DIAGNOSIS — IMO0002 Reserved for concepts with insufficient information to code with codable children: Secondary | ICD-10-CM

## 2016-09-04 DIAGNOSIS — Z3490 Encounter for supervision of normal pregnancy, unspecified, unspecified trimester: Secondary | ICD-10-CM

## 2016-09-04 LAB — BETA HCG QUANT (REF LAB): HCG QUANT: 61259 m[IU]/mL

## 2016-09-04 NOTE — Progress Notes (Signed)
Patient ID: Gina Chung, female   DOB: 12/28/1975, 40 y.o.   MRN: [id number]  No chief complaint on file.   HPI Gina Chung is a 40 y.o. female.  Here for f/u on US results>  In office TVUS: fetal pole 13105w2d, no cardiac activity, irregularly shaped gestational sac, yoke sac present, ?hemorrhage noted.   Last pregnancy was a miscarriage with bleeding.   Denies any vaginal bleeding today or since pregnancy started.  Took pregnancy test end of October at home and it was positive.  HCG is 61,529 from 09/03/16.    HPI  Past Medical History:  Diagnosis Date  . Anemia   . History of UTI   . Language barrier    arabic and english  . Migraine     Past Surgical History:  Procedure Laterality Date  . COLPOSCOPY      Family History  Problem Relation Age of Onset  . Rheum arthritis Mother   . Hypertension Mother   . Hypertension Father   . Diabetes Maternal Grandfather     Social History Social History  Substance Use Topics  . Smoking status: Never Smoker  . Smokeless tobacco: Never Used  . Alcohol use No    No Known Allergies  Current Outpatient Prescriptions  Medication Sig Dispense Refill  . butalbital-acetaminophen-caffeine (FIORICET, ESGIC) 50-325-40 MG tablet Take 1-2 tablets by mouth every 6 (six) hours as needed for headache. 45 tablet 4  . Prenatal Vit-Fe Fumarate-FA (MULTIVITAMIN-PRENATAL) 27-0.8 MG TABS tablet Take 1 tablet by mouth daily at 12 noon.     No current facility-administered medications for this visit.     Review of Systems Review of Systems Constitutional: negative for fatigue and weight loss Respiratory: negative for cough and wheezing Cardiovascular: negative for chest pain, fatigue and palpitations Gastrointestinal: negative for abdominal pain and change in bowel habits Genitourinary:negative Integument/breast: negative for nipple discharge Musculoskeletal:negative for myalgias Neurological: negative for gait problems and  tremors Behavioral/Psych: negative for abusive relationship, depression Endocrine: negative for temperature intolerance      Last menstrual period 06/17/2016, currently breastfeeding.  Physical Exam Physical Exam General:   alert  Skin:   no rash or abnormalities  PE: deferred, discuss US results and POC.    100% of 15 min visit spent on counseling and coordination of care.    Data Reviewed Previous medical hx, meds, labs, TUVS 09/04/16.   Assessment     Early pregnancy No cardiac activity on TUVS    Plan   Patient instructed to go to [facility]U on Sunday for repeat quant level.  Repeat TUVS scheduled for 7-10 days.  Patient verbalized understanding. Discussed US results and Quant with Dr. Ladean Rayaonstance: recommended f/u TUVS in 7-10 days.   Orders Placed This Encounter  Procedures  . US OB Comp Less 14 Wks    Standing Status:   Future    Standing Expiration Date:   11/05/2017    Scheduling Instructions:     7-10 days from today.    Order Specific Question:   Reason for Exam (SYMPTOM  OR DIAGNOSIS REQUIRED)    Answer:   viability    Order Specific Question:   Preferred imaging location?    Answer:   Internal   No orders of the defined types were placed in this encounter.    Follow up as scheduled.

## 2016-09-06 LAB — CULTURE, OB URINE

## 2016-09-06 LAB — URINE CULTURE, OB REFLEX

## 2016-09-09 ENCOUNTER — Encounter: Payer: BLUE CROSS/BLUE SHIELD | Admitting: Certified Nurse Midwife

## 2016-09-09 LAB — CYTOLOGY - PAP
Diagnosis: NEGATIVE
HPV (WINDOPATH): NOT DETECTED

## 2016-09-10 ENCOUNTER — Other Ambulatory Visit: Payer: Self-pay | Admitting: Certified Nurse Midwife

## 2016-09-10 ENCOUNTER — Ambulatory Visit: Payer: BLUE CROSS/BLUE SHIELD

## 2016-09-10 ENCOUNTER — Encounter: Payer: Self-pay | Admitting: Obstetrics & Gynecology

## 2016-09-10 ENCOUNTER — Ambulatory Visit (INDEPENDENT_AMBULATORY_CARE_PROVIDER_SITE_OTHER): Payer: BLUE CROSS/BLUE SHIELD | Admitting: Obstetrics & Gynecology

## 2016-09-10 VITALS — BP 131/80 | HR 87

## 2016-09-10 DIAGNOSIS — O021 Missed abortion: Secondary | ICD-10-CM | POA: Diagnosis not present

## 2016-09-10 DIAGNOSIS — IMO0002 Reserved for concepts with insufficient information to code with codable children: Secondary | ICD-10-CM

## 2016-09-10 DIAGNOSIS — Z3490 Encounter for supervision of normal pregnancy, unspecified, unspecified trimester: Secondary | ICD-10-CM

## 2016-09-10 LAB — NUSWAB VAGINITIS PLUS (VG+)
Candida albicans, NAA: NEGATIVE
Candida glabrata, NAA: NEGATIVE
Chlamydia trachomatis, NAA: NEGATIVE
NEISSERIA GONORRHOEAE, NAA: NEGATIVE
Trich vag by NAA: NEGATIVE

## 2016-09-10 MED ORDER — MISOPROSTOL 200 MCG PO TABS: ORAL_TABLET | ORAL | 0 refills | Status: DC

## 2016-09-10 MED ORDER — OXYCODONE-ACETAMINOPHEN 5-325 MG PO TABS: 1.0000 | ORAL_TABLET | Freq: Four times a day (QID) | ORAL | 0 refills | Status: DC | PRN

## 2016-09-10 NOTE — Progress Notes (Signed)
Ultrasounds Results Note  SUBJECTIVE HPI:  Ms. Gina Chung is a 40 y.o. [id number] at 6360w1d by LMP who [facility] follow[facility] ultrasound results. The patient denies abdominal pain or vaginal bleeding.  Upon review of the patient's records, patient was first seen in office on 12/21 for OB visit.   BHCG on that day was [id number].  Ultrasound showed irregular sac and possible 5.5 week pole. Repeat ultrasound was performed earlier today and the findings were the same  Past Medical History:  Diagnosis Date  . Anemia   . History of UTI   . Language barrier    arabic and english  . Migraine    Past Surgical History:  Procedure Laterality Date  . COLPOSCOPY     Social History   Social History  . Marital status: Married    Spouse name: N/A  . Number of children: 3  . Years of education: N/A   Occupational History  . Not on file.   Social History Main Topics  . Smoking status: Never Smoker  . Smokeless tobacco: Never Used  . Alcohol use No  . Drug use: No  . Sexual activity: Yes    Partners: Male    Birth control/ protection: None   Other Topics Concern  . Not on file   Social History Narrative  . No narrative on file   Current Outpatient Prescriptions on File Prior to Visit  Medication Sig Dispense Refill  . butalbital-acetaminophen-caffeine (FIORICET, ESGIC) 50-325-40 MG tablet Take 1-2 tablets by mouth every 6 (six) hours as needed for headache. (Patient not taking: Reported on 09/10/2016) 45 tablet 4  . Prenatal Vit-Fe Fumarate-FA (MULTIVITAMIN-PRENATAL) 27-0.8 MG TABS tablet Take 1 tablet by mouth daily at 12 noon.     No current facility-administered medications on file prior to visit.    No Known Allergies  I have reviewed patient's Past Medical Hx, Surgical Hx, Family Hx, Social Hx, medications and allergies.   Review of Systems Review of Systems  Constitutional: Negative for fever and chills.  Gastrointestinal: Negative for nausea, vomiting,  abdominal pain, diarrhea and constipation.  Genitourinary: Negative for dysuria.  Musculoskeletal: Negative for back pain.  Neurological: Negative for dizziness and weakness.    Physical Exam  BP 131/80   Pulse 87   LMP 06/17/2016   GENERAL: Well-developed, well-nourished female in no acute distress.  HEENT: Normocephalic, atraumatic.   LUNGS: Effort normal ABDOMEN: soft, non-tender HEART: Regular rate  SKIN: Warm, dry and without erythema PSYCH: Normal mood and affect NEURO: Alert and oriented x 4  LAB RESULTS No results found for this or any previous visit (from the past 24 hour(s)).  IMAGING No results found.  ASSESSMENT 1. Missed abortion     PLAN Discharge home in stable condition Cytotec 400 mg PV and may repeat in 24 hr after options for expectant management or D&C were discussed  Precautions were given and she will return in 1 week  Adam PhenixJames G Jannatul Wojdyla, MD  09/10/2016  12:45 PM

## 2016-09-11 ENCOUNTER — Inpatient Hospital Stay (HOSPITAL_COMMUNITY): Payer: BLUE CROSS/BLUE SHIELD | Admitting: Anesthesiology

## 2016-09-11 ENCOUNTER — Encounter (HOSPITAL_COMMUNITY)
Admission: AD | Disposition: A | Discharge status: Home / Self Care | Payer: Self-pay | Source: Ambulatory Visit | Attending: Obstetrics and Gynecology

## 2016-09-11 ENCOUNTER — Other Ambulatory Visit: Payer: Self-pay | Admitting: Obstetrics and Gynecology

## 2016-09-11 ENCOUNTER — Encounter (HOSPITAL_COMMUNITY): Payer: Self-pay | Admitting: *Deleted

## 2016-09-11 ENCOUNTER — Inpatient Hospital Stay (HOSPITAL_COMMUNITY): Payer: BLUE CROSS/BLUE SHIELD

## 2016-09-11 ENCOUNTER — Ambulatory Visit (HOSPITAL_COMMUNITY)
Admission: AD | Admit: 2016-09-11 | Discharge: 2016-09-11 | Disposition: A | Discharge status: Home / Self Care | Payer: BLUE CROSS/BLUE SHIELD | Source: Ambulatory Visit | Attending: Obstetrics and Gynecology | Admitting: Obstetrics and Gynecology

## 2016-09-11 DIAGNOSIS — O021 Missed abortion: Secondary | ICD-10-CM | POA: Insufficient documentation

## 2016-09-11 DIAGNOSIS — O4690 Antepartum hemorrhage, unspecified, unspecified trimester: Secondary | ICD-10-CM

## 2016-09-11 DIAGNOSIS — R51 Headache: Secondary | ICD-10-CM | POA: Insufficient documentation

## 2016-09-11 DIAGNOSIS — O031 Delayed or excessive hemorrhage following incomplete spontaneous abortion: Secondary | ICD-10-CM

## 2016-09-11 DIAGNOSIS — O209 Hemorrhage in early pregnancy, unspecified: Secondary | ICD-10-CM | POA: Diagnosis present

## 2016-09-11 HISTORY — PX: DILATION AND EVACUATION: SHX1459

## 2016-09-11 LAB — CBC
HCT: 31.1 % — ABNORMAL LOW (ref 36.0–46.0)
Hemoglobin: 10.5 g/dL — ABNORMAL LOW (ref 12.0–15.0)
MCH: 29.3 pg (ref 26.0–34.0)
MCHC: 33.8 g/dL (ref 30.0–36.0)
MCV: 86.9 fL (ref 78.0–100.0)
PLATELETS: 369 10*3/uL (ref 150–400)
RBC: 3.58 MIL/uL — AB (ref 3.87–5.11)
RDW: 13.7 % (ref 11.5–15.5)
WBC: 8.3 10*3/uL (ref 4.0–10.5)

## 2016-09-11 LAB — TYPE AND SCREEN
ABO/RH(D): O POS
Antibody Screen: NEGATIVE

## 2016-09-11 SURGERY — DILATION AND EVACUATION, UTERUS
Anesthesia: General | Site: Vagina

## 2016-09-11 MED ORDER — FAMOTIDINE IN NACL 20-0.9 MG/50ML-% IV SOLN
20.0000 mg | Freq: Once | INTRAVENOUS | Status: AC
  Administered 2016-09-11: 20 mg via INTRAVENOUS
  Filled 2016-09-11: qty 50

## 2016-09-11 MED ORDER — MISOPROSTOL 200 MCG PO TABS
ORAL_TABLET | ORAL | Status: DC | PRN
  Administered 2016-09-11: 200 ug via ORAL

## 2016-09-11 MED ORDER — MIDAZOLAM HCL 2 MG/2ML IJ SOLN
INTRAMUSCULAR | Status: AC
  Filled 2016-09-11: qty 2

## 2016-09-11 MED ORDER — ONDANSETRON HCL 4 MG/2ML IJ SOLN
INTRAMUSCULAR | Status: AC
  Filled 2016-09-11: qty 2

## 2016-09-11 MED ORDER — ONDANSETRON HCL 4 MG/2ML IJ SOLN
INTRAMUSCULAR | Status: DC | PRN
  Administered 2016-09-11: 4 mg via INTRAVENOUS

## 2016-09-11 MED ORDER — KETOROLAC TROMETHAMINE 30 MG/ML IJ SOLN
INTRAMUSCULAR | Status: DC | PRN
  Administered 2016-09-11: 30 mg via INTRAVENOUS

## 2016-09-11 MED ORDER — HYDROMORPHONE HCL 1 MG/ML IJ SOLN: 0.2500 mg | INTRAMUSCULAR | Status: DC | PRN

## 2016-09-11 MED ORDER — PROMETHAZINE HCL 25 MG/ML IJ SOLN: 6.2500 mg | INTRAMUSCULAR | Status: DC | PRN

## 2016-09-11 MED ORDER — SODIUM CHLORIDE 0.9 % IV SOLN
INTRAVENOUS | Status: DC | PRN
  Administered 2016-09-11: 19:00:00 via INTRAVENOUS

## 2016-09-11 MED ORDER — FENTANYL CITRATE (PF) 100 MCG/2ML IJ SOLN
INTRAMUSCULAR | Status: DC | PRN
  Administered 2016-09-11 (×2): 50 ug via INTRAVENOUS

## 2016-09-11 MED ORDER — OXYCODONE HCL 5 MG PO TABS: 5.0000 mg | ORAL_TABLET | Freq: Four times a day (QID) | ORAL | 0 refills | Status: DC | PRN

## 2016-09-11 MED ORDER — MIDAZOLAM HCL 5 MG/5ML IJ SOLN
INTRAMUSCULAR | Status: DC | PRN
  Administered 2016-09-11: 2 mg via INTRAVENOUS

## 2016-09-11 MED ORDER — DEXAMETHASONE SODIUM PHOSPHATE 10 MG/ML IJ SOLN
INTRAMUSCULAR | Status: AC
  Filled 2016-09-11: qty 1

## 2016-09-11 MED ORDER — FENTANYL CITRATE (PF) 100 MCG/2ML IJ SOLN
INTRAMUSCULAR | Status: AC
  Filled 2016-09-11: qty 2

## 2016-09-11 MED ORDER — LIDOCAINE HCL (CARDIAC) 20 MG/ML IV SOLN
INTRAVENOUS | Status: DC | PRN
  Administered 2016-09-11: 50 mg via INTRAVENOUS

## 2016-09-11 MED ORDER — LACTATED RINGERS IV SOLN
INTRAVENOUS | Status: DC | PRN
  Administered 2016-09-11: 20:00:00 via INTRAVENOUS

## 2016-09-11 MED ORDER — PROPOFOL 10 MG/ML IV BOLUS
INTRAVENOUS | Status: AC
  Filled 2016-09-11: qty 20

## 2016-09-11 MED ORDER — OXYCODONE HCL 5 MG/5ML PO SOLN: 5.0000 mg | Freq: Once | ORAL | Status: DC | PRN

## 2016-09-11 MED ORDER — KETOROLAC TROMETHAMINE 30 MG/ML IJ SOLN
INTRAMUSCULAR | Status: AC
  Filled 2016-09-11: qty 1

## 2016-09-11 MED ORDER — PROPOFOL 10 MG/ML IV BOLUS
INTRAVENOUS | Status: DC | PRN
  Administered 2016-09-11: 150 mg via INTRAVENOUS

## 2016-09-11 MED ORDER — DEXAMETHASONE SODIUM PHOSPHATE 10 MG/ML IJ SOLN
INTRAMUSCULAR | Status: DC | PRN
  Administered 2016-09-11: 10 mg via INTRAVENOUS

## 2016-09-11 MED ORDER — MISOPROSTOL 25 MCG QUARTER TABLET: 200.0000 ug | ORAL_TABLET | Freq: Once | ORAL | Status: DC

## 2016-09-11 MED ORDER — LIDOCAINE HCL (CARDIAC) 20 MG/ML IV SOLN
INTRAVENOUS | Status: AC
  Filled 2016-09-11: qty 5

## 2016-09-11 MED ORDER — LACTATED RINGERS IV SOLN: INTRAVENOUS | Status: DC

## 2016-09-11 MED ORDER — IBUPROFEN 800 MG PO TABS: 800.0000 mg | ORAL_TABLET | Freq: Three times a day (TID) | ORAL | 0 refills | Status: DC | PRN

## 2016-09-11 MED ORDER — MEPERIDINE HCL 25 MG/ML IJ SOLN
6.2500 mg | INTRAMUSCULAR | Status: DC | PRN
Start: 2016-09-11 — End: 2016-09-12

## 2016-09-11 MED ORDER — OXYCODONE HCL 5 MG PO TABS: 5.0000 mg | ORAL_TABLET | Freq: Once | ORAL | Status: DC | PRN

## 2016-09-11 MED ORDER — SOD CITRATE-CITRIC ACID 500-334 MG/5ML PO SOLN
30.0000 mL | Freq: Once | ORAL | Status: AC
  Administered 2016-09-11: 30 mL via ORAL
  Filled 2016-09-11: qty 15

## 2016-09-11 SURGICAL SUPPLY — 19 items
CATH ROBINSON RED A/P 16FR (CATHETERS) ×2 IMPLANT
CLOTH BEACON ORANGE TIMEOUT ST (SAFETY) ×2 IMPLANT
DECANTER SPIKE VIAL GLASS SM (MISCELLANEOUS) ×2 IMPLANT
GLOVE BIO SURGEON STRL SZ7.5 (GLOVE) ×2 IMPLANT
GLOVE BIOGEL PI IND STRL 7.0 (GLOVE) ×1 IMPLANT
GLOVE BIOGEL PI INDICATOR 7.0 (GLOVE) ×1
GOWN STRL REUS W/TWL LRG LVL3 (GOWN DISPOSABLE) ×2 IMPLANT
GOWN STRL REUS W/TWL XL LVL3 (GOWN DISPOSABLE) ×2 IMPLANT
KIT BERKELEY 1ST TRIMESTER 3/8 (MISCELLANEOUS) ×2 IMPLANT
NS IRRIG 1000ML POUR BTL (IV SOLUTION) ×2 IMPLANT
PACK VAGINAL MINOR WOMEN LF (CUSTOM PROCEDURE TRAY) ×2 IMPLANT
PAD OB MATERNITY 4.3X12.25 (PERSONAL CARE ITEMS) ×2 IMPLANT
PAD PREP 24X48 CUFFED NSTRL (MISCELLANEOUS) ×2 IMPLANT
SET BERKELEY SUCTION TUBING (SUCTIONS) ×2 IMPLANT
TOWEL OR 17X24 6PK STRL BLUE (TOWEL DISPOSABLE) ×4 IMPLANT
VACURETTE 10 RIGID CVD (CANNULA) IMPLANT
VACURETTE 7MM CVD STRL WRAP (CANNULA) IMPLANT
VACURETTE 8 RIGID CVD (CANNULA) ×1 IMPLANT
VACURETTE 9 RIGID CVD (CANNULA) IMPLANT

## 2016-09-11 NOTE — Anesthesia Preprocedure Evaluation (Signed)
Anesthesia Evaluation  Patient identified by MRN, date of birth, ID band Patient awake    Reviewed: Allergy & Precautions, NPO status , Patient's Chart, lab work & pertinent test results  Airway Mallampati: I  TM Distance: >3 FB Neck ROM: Full    Dental  (+) Teeth Intact, Dental Advisory Given   Pulmonary    breath sounds clear to auscultation       Cardiovascular negative cardio ROS   Rhythm:Regular Rate:Normal     Neuro/Psych  Headaches, negative psych ROS   GI/Hepatic negative GI ROS, Neg liver ROS,   Endo/Other  negative endocrine ROS  Renal/GU negative Renal ROS  negative genitourinary   Musculoskeletal negative musculoskeletal ROS (+)   Abdominal   Peds negative pediatric ROS (+)  Hematology negative hematology ROS (+)   Anesthesia Other Findings   Reproductive/Obstetrics negative OB ROS                             Anesthesia Physical Anesthesia Plan  ASA: II  Anesthesia Plan: General   Post-op Pain Management:    Induction: Intravenous  Airway Management Planned: LMA  Additional Equipment:   Intra-op Plan:   Post-operative Plan: Extubation in OR  Informed Consent: I have reviewed the patients History and Physical, chart, labs and discussed the procedure including the risks, benefits and alternatives for the proposed anesthesia with the patient or authorized representative who has indicated his/her understanding and acceptance.   Dental advisory given  Plan Discussed with: CRNA  Anesthesia Plan Comments:         Anesthesia Quick Evaluation

## 2016-09-11 NOTE — H&P (Signed)
Gina Chung is an 40 y.o. female [id number] 12 2/7 weeks by LMP Dx with MAB yesterday. Took Cytotec today and had had heavy bleeding and cramping since. Presented to [facility] for further evaluation. Noted to be orthostatic and U/S revealed retained POC's  Blood type O +   Menstrual History: Menarche age: [age] Patient's last menstrual period was 06/17/2016.    Past Medical History:  Diagnosis Date  . Anemia   . History of UTI   . Language barrier    arabic and english  . Migraine     Past Surgical History:  Procedure Laterality Date  . COLPOSCOPY      Family History  Problem Relation Age of Onset  . Rheum arthritis Mother   . Hypertension Mother   . Hypertension Father   . Diabetes Maternal Grandfather     Social History:  reports that she has never smoked. She has never used smokeless tobacco. She reports that she does not drink alcohol or use drugs.  Allergies: No Known Allergies   (Not in a hospital admission)  Review of Systems  Respiratory: Negative.   Cardiovascular: Negative.   Gastrointestinal: Negative.   Genitourinary: Negative.   Neurological: Positive for weakness.    Last menstrual period 06/17/2016, currently breastfeeding. Physical Exam  Constitutional: She appears well-developed and well-nourished.  Neck: Normal range of motion. Neck supple.  Cardiovascular: Normal rate and regular rhythm.   Respiratory: Effort normal and breath sounds normal.  GI: Soft. Bowel sounds are normal.    Results for orders placed or performed during the hospital encounter of 09/11/16 (from the past 24 hour(s))  CBC     Status: Abnormal   Collection Time: 09/11/16  4:35 PM  Result Value Ref Range   WBC 8.3 4.0 - 10.5 K/uL   RBC 3.58 (L) 3.87 - 5.11 MIL/uL   Hemoglobin 10.5 (L) 12.0 - 15.0 g/dL   HCT 45.431.1 (L) 09.836.0 - 11.946.0 %   MCV 86.9 78.0 - 100.0 fL   MCH 29.3 26.0 - 34.0 pg   MCHC 33.8 30.0 - 36.0 g/dL   RDW 14.713.7 82.911.5 - 56.215.5 %   Platelets 369 150 - 400 K/uL  Type  and screen     Status: None   Collection Time: 09/11/16  4:35 PM  Result Value Ref Range   ABO/RH(D) O POS    Antibody Screen NEG    Sample Expiration 09/14/2016     [place] Ob Comp Less 14 Wks  Result Date: 09/11/2016 CLINICAL DATA:  Missed abortion. Heavy vaginal bleeding. Cytotec given yesterday. Quantitative beta HCG was 61,259 on 09/03/2016. By report, a gestational sac with fetal pole was identified on an outside ultrasound exam performed yesterday. EXAM: OBSTETRIC <14 WK ULTRASOUND TECHNIQUE: Transabdominal ultrasound was performed for evaluation of the gestation as well as the maternal uterus and adnexal regions. COMPARISON:  None available. FINDINGS: Intrauterine gestational sac: Not seen Yolk sac:  Not seen Embryo:  Not seen Cardiac Activity: Absent Subchorionic hemorrhage: None visualized. Thick, heterogeneous endometrial contents contain blood flow on Doppler evaluation. Endometrial thickening/ soft tissue is estimated to be 4.0 cm. Maternal uterus/adnexae: The right ovary is not visualized. Left ovary has a normal appearance. IMPRESSION: 1. No intrauterine pregnancy identified. 2. Heterogeneous endometrial contents compatible with retained products of conception. Electronically Signed   By: Norva PavlovElizabeth  Brown M.D.   On: 09/11/2016 17:35    Assessment/Plan: Retained POC  Management options of watch and follow vs surgery reviewed with pt. Pt elected to proceed  with D & E. R/B/Post op care reviewed with pt. Pt verbalized understanding. Nursing and anesthesia notified. Will proceed to OR when ready.   Gina StaggersMichael L Breeonna Chung 09/11/2016, 6:37 PM

## 2016-09-11 NOTE — Transfer of Care (Signed)
Immediate Anesthesia Transfer of Care Note  Patient: Gina Chung  Procedure(s) Performed: Procedure(s): DILATATION AND EVACUATION (N/A)  Patient Location: PACU  Anesthesia Type:General  Level of Consciousness: sedated  Airway & Oxygen Therapy: Patient Spontanous Breathing and Patient connected to nasal cannula oxygen  Post-op Assessment: Report given to RN and Post -op Vital signs reviewed and stable  Post vital signs: stable  Last Vitals:  Vitals:   09/11/16 1636 09/11/16 1742  BP: 116/73 126/77  Pulse: 76 82  Resp: 16     Last Pain: There were no vitals filed for this visit.       Complications: No apparent anesthesia complications

## 2016-09-11 NOTE — Op Note (Signed)
Gina Chung PROCEDURE DATE: 09/11/2016  PREOPERATIVE DIAGNOSIS: 12 week missed abortion and retained products of conception POSTOPERATIVE DIAGNOSIS: The same PROCEDURE:     Dilation and Evacuation SURGEON:  Dr. Casimiro NeedleMichael L. Maretta Overdorf  INDICATIONS: 40 y.o. [id number] with MAB at [redacted] weeks gestation and retained products of conception, needing surgical completion.  Risks of surgery were discussed with the patient including but not limited to: bleeding which may require transfusion; infection which may require antibiotics; injury to uterus or surrounding organs; need for additional procedures including laparotomy or laparoscopy; possibility of intrauterine scarring which may impair future fertility; and other postoperative/anesthesia complications. Written informed consent was obtained.    FINDINGS:  A 10 week size uterus, moderate amounts of products of conception, specimen sent to pathology.  ANESTHESIA:  GETA INTRAVENOUS FLUIDS:  1000 ml of LR ESTIMATED BLOOD LOSS:  50 ml. SPECIMENS:  Products of conception sent to pathology COMPLICATIONS:  None immediate.  PROCEDURE DETAILS:    The patient was  taken to the operating room where  She underwent GETA.  After an adequate timeout was performed, she was placed in the dorsal lithotomy position and examined; then prepped and draped in the sterile manner.   Her bladder was catheterized for approximately 350 cc of clear, yellow urine. A vaginal speculum was then placed in the patient's vagina and a single tooth tenaculum was applied to the anterior lip of the cervix.   The cervix was gently dilated to accommodate a 8 mm suction curette that was gently advanced to the uterine fundus.  The suction device was then activated and curette slowly rotated to clear the uterus of products of conception.  A sharp curettage was then performed to confirm complete emptying of the uterus. There was minimal bleeding noted and the tenaculum removed with good hemostasis noted.   All  instruments were removed from the patient's vagina. Bimanual exam found the uterus to be firm.  Sponge and instrument counts were correct times two  The patient tolerated the procedure well and was taken to the recovery area awake, and in stable condition. 200 mcg Cytotec was placed rectal at the conclusion of the case  The patient will be discharged to home as per PACU criteria.  Routine postoperative instructions given.  She was prescribed Percocet, Ibuprofen and Colace.  She will follow up in the clinic in 4 weeks for postoperative evaluation.   Gina Michalowski [name], MD, FACOG Attending Obstetrician & Gynecologist Faculty Practice, [facility]

## 2016-09-11 NOTE — Anesthesia Procedure Notes (Signed)
Procedure Name: LMA Insertion Date/Time: 09/11/2016 7:23 PM Performed by: Fanny DanceMULLINS, [name] Pre-anesthesia Checklist: Patient identified, Emergency Drugs available, Suction available and Patient being monitored Patient Re-evaluated:Patient Re-evaluated prior to inductionOxygen Delivery Method: Circle system utilized Preoxygenation: Pre-oxygenation with 100% oxygen Intubation Type: IV induction Ventilation: Mask ventilation without difficulty LMA: LMA inserted LMA Size: 4.0 Number of attempts: 1 Dental Injury: Teeth and Oropharynx as per pre-operative assessment

## 2016-09-11 NOTE — Anesthesia Postprocedure Evaluation (Signed)
Anesthesia Post Note  Patient: Maresa M Stayer  Procedure(s) Performed: Procedure(s) (LRB): DILATATION AND EVACUATION (N/A)  Patient location during evaluation: PACU Anesthesia Type: General Level of consciousness: awake and alert Pain management: pain level controlled Vital Signs Assessment: post-procedure vital signs reviewed and stable Respiratory status: spontaneous breathing, nonlabored ventilation, respiratory function stable and patient connected to nasal cannula oxygen Cardiovascular status: blood pressure returned to baseline and stable Postop Assessment: no signs of nausea or vomiting Anesthetic complications: no        Last Vitals:  Vitals:   09/11/16 2030 09/11/16 2045  BP: 110/63 109/61  Pulse: 78 73  Resp: 16 16  Temp:  37.1 C    Last Pain:  Vitals:   09/11/16 1950  PainSc: Asleep   Pain Goal:                 Shelton SilvasKevin D Aahna Rossa

## 2016-09-11 NOTE — MAU Note (Signed)
Pt states she has had a lot of bleeding and clots since 0800 this morning.  States she is not hurting at the moment.  She was seemingly very weak and faint in the lobby but with help we were able to get her into a wheelchair and into a room and into the bed.  Labs and IV ordered.

## 2016-09-11 NOTE — MAU Provider Note (Signed)
Chief Complaint: No chief complaint on file.   First Provider Initiated Contact with Patient 09/11/16 1651     SUBJECTIVE HPI: Gina Chung is a 40 y.o. [id number] w/ recent Dx missed AB [facility] who presents to [facility] Admissions reporting heavy bleeding and dizziness. Took Cytotec at 0400. Started bleeding heavily and cramping at 0800. Took 1 Percocet at 1500. No pain now.   Associated signs and symptoms: Pos for dizziness. Neg for fever, chills.   Past Medical History:  Diagnosis Date  . Anemia   . History of UTI   . Language barrier    arabic and english  . Migraine    OB History  Gravida Para Term Preterm AB Living  6 4 4  0 2 4  SAB TAB Ectopic Multiple Live Births  2 0 0 0 4    # Outcome Date GA Lbr Len/2nd Weight Sex Delivery Anes PTL Lv  6 SAB 09/10/16 727w1d    SAB     5 Term 06/08/13 3733w4d 19:28 / 00:20 7 lb 13.2 oz (3.549 kg) M Vag-Spont EPI  LIV  4 Term 12/31/11 4976w6d 05:30 / 00:07 7 lb 15.3 oz (3.61 kg) F Vag-Spont EPI  LIV     Birth Comments: wnl  3 SAB 2010          2 Term 2008 3968w0d 02:00 7 lb 1 oz (3.204 kg) F Vag-Spont EPI  LIV  1 Term 2006 8918w0d 12:00 7 lb 6 oz (3.345 kg) M Vag-Spont EPI  LIV     Past Surgical History:  Procedure Laterality Date  . COLPOSCOPY     Social History   Social History  . Marital status: Married    Spouse name: N/A  . Number of children: 3  . Years of education: N/A   Occupational History  . Not on file.   Social History Main Topics  . Smoking status: Never Smoker  . Smokeless tobacco: Never Used  . Alcohol use No  . Drug use: No  . Sexual activity: Yes    Partners: Male    Birth control/ protection: None   Other Topics Concern  . Not on file   Social History Narrative  . No narrative on file   Family History  Problem Relation Age of Onset  . Rheum arthritis Mother   . Hypertension Mother   . Hypertension Father   . Diabetes Maternal Grandfather    No current  facility-administered medications on file prior to encounter.    Current Outpatient Prescriptions on File Prior to Encounter  Medication Sig Dispense Refill  . butalbital-acetaminophen-caffeine (FIORICET, ESGIC) 50-325-40 MG tablet Take 1-2 tablets by mouth every 6 (six) hours as needed for headache. 45 tablet 4  . misoprostol (CYTOTEC) 200 MCG tablet Place two tablets in the vagina and may repeat in 24 hr tissue has not passed (Patient taking differently: Place 400 mcg vaginally once. Place two tablets in the vagina and may repeat in 24 hr tissue has not passed) 4 tablet 0  . oxyCODONE-acetaminophen (PERCOCET/ROXICET) 5-325 MG tablet Take 1-2 tablets by mouth every 6 (six) hours as needed. (Patient taking differently: Take 1-2 tablets by mouth every 6 (six) hours as needed for moderate pain or severe pain. ) 10 tablet 0  . Prenatal Vit-Fe Fumarate-FA (MULTIVITAMIN-PRENATAL) 27-0.8 MG TABS tablet Take 1 tablet by mouth daily at 12 noon.     No Known Allergies  I have reviewed patient's Past Medical Hx, Surgical Hx, Family  Hx, Social Hx, medications and allergies.   Review of Systems  Constitutional: Negative for chills and fever.  Gastrointestinal: Negative for abdominal pain.  Genitourinary: Positive for vaginal bleeding.  Neurological: Positive for dizziness. Negative for syncope.    OBJECTIVE Patient Vitals for the past 24 hrs:  BP Pulse Resp  09/11/16 1742 126/77 82 -  09/11/16 1636 116/73 76 16   Constitutional: Well-developed, well-nourished female w/ near-syncope in the lobby.  Skin: Pale Cardiovascular: normal rate Respiratory: normal rate and effort.  GI: Abd soft, non-tender. MS: Extremities nontender, no edema, normal ROM Neurologic: Alert and oriented x 4.  GU:  SPECULUM EXAM: NEFG, moderate amount of bright red blood and clots noted, cervix clean, normal ectropion.   BIMANUAL: cervix closed; uterus top-normal size, no adnexal tenderness or masses.  No CMT.  LAB  RESULTS Results for orders placed or performed during the hospital encounter of 09/11/16 (from the past 24 hour(s))  CBC     Status: Abnormal   Collection Time: 09/11/16  4:35 PM  Result Value Ref Range   WBC 8.3 4.0 - 10.5 K/uL   RBC 3.58 (L) 3.87 - 5.11 MIL/uL   Hemoglobin 10.5 (L) 12.0 - 15.0 g/dL   HCT 13.031.1 (L) 86.536.0 - 78.446.0 %   MCV 86.9 78.0 - 100.0 fL   MCH 29.3 26.0 - 34.0 pg   MCHC 33.8 30.0 - 36.0 g/dL   RDW 69.613.7 29.511.5 - 28.415.5 %   Platelets 369 150 - 400 K/uL  Type and screen     Status: None   Collection Time: 09/11/16  4:35 PM  Result Value Ref Range   ABO/RH(D) O POS    Antibody Screen NEG    Sample Expiration 09/14/2016     IMAGING [place] Ob Comp Less 14 Wks  Result Date: 09/11/2016 CLINICAL DATA:  Missed abortion. Heavy vaginal bleeding. Cytotec given yesterday. Quantitative beta HCG was 61,259 on 09/03/2016. By report, a gestational sac with fetal pole was identified on an outside ultrasound exam performed yesterday. EXAM: OBSTETRIC <14 WK ULTRASOUND TECHNIQUE: Transabdominal ultrasound was performed for evaluation of the gestation as well as the maternal uterus and adnexal regions. COMPARISON:  None available. FINDINGS: Intrauterine gestational sac: Not seen Yolk sac:  Not seen Embryo:  Not seen Cardiac Activity: Absent Subchorionic hemorrhage: None visualized. Thick, heterogeneous endometrial contents contain blood flow on Doppler evaluation. Endometrial thickening/ soft tissue is estimated to be 4.0 cm. Maternal uterus/adnexae: The right ovary is not visualized. Left ovary has a normal appearance. IMPRESSION: 1. No intrauterine pregnancy identified. 2. Heterogeneous endometrial contents compatible with retained products of conception. Electronically Signed   By: Norva PavlovElizabeth  Brown M.D.   On: 09/11/2016 17:35    MAU COURSE Orders Placed This Encounter  Procedures  . US OB Comp Less 14 Wks  . CBC  . Type and screen   Last solid food 1100. Last liquids 1400. Discussed Hx,  exam, US, near-syncope w/ Dr. Alysia PennaErvin. Recommends D&C. Discussed R/B/I D&C vs cytotec vs expectant management w/ pt. Pt wnta D&C.   MDM - Incomplete AB w/ symptomatic although mild anemia (near-syncope, unable to tolerate HOB raised 45 degrees). Recommend D&C. Pt consents.   ASSESSMENT 1. Missed abortion   2. Abortion, spontaneous incomplete with hemorrhage   3. Vaginal bleeding in pregnancy     PLAN Prep for OR per Dr. Alysia PennaErvin. Dr. Alysia PennaErvin to come discuss procedure in detail w/ pt.   [place] Creek[place]ia Jakayla Schweppe, [place] 09/11/2016  6:14 PM

## 2016-09-12 ENCOUNTER — Encounter (HOSPITAL_COMMUNITY): Payer: Self-pay | Admitting: Obstetrics and Gynecology

## 2016-09-13 LAB — TOXASSURE SELECT 13 (MW), URINE

## 2016-09-15 ENCOUNTER — Ambulatory Visit: Payer: Self-pay

## 2016-09-15 LAB — CYSTIC FIBROSIS MUTATION 97: GENE DIS ANAL CARRIER INTERP BLD/T-IMP: NOT DETECTED

## 2016-09-15 LAB — OBSTETRIC PANEL, INCLUDING HIV
ANTIBODY SCREEN: NEGATIVE
BASOS: 0 %
Basophils Absolute: 0 10*3/uL (ref 0.0–0.2)
EOS (ABSOLUTE): 0.1 10*3/uL (ref 0.0–0.4)
EOS: 1 %
HEMATOCRIT: 32.7 % — AB (ref 34.0–46.6)
HEP B S AG: NEGATIVE
HIV SCREEN 4TH GENERATION: NONREACTIVE
Hemoglobin: 10.9 g/dL — ABNORMAL LOW (ref 11.1–15.9)
Immature Grans (Abs): 0 10*3/uL (ref 0.0–0.1)
Immature Granulocytes: 0 %
Lymphocytes Absolute: 2.4 10*3/uL (ref 0.7–3.1)
Lymphs: 43 %
MCH: 28.2 pg (ref 26.6–33.0)
MCHC: 33.3 g/dL (ref 31.5–35.7)
MCV: 85 fL (ref 79–97)
MONOCYTES: 6 %
Monocytes Absolute: 0.3 10*3/uL (ref 0.1–0.9)
NEUTROS ABS: 2.9 10*3/uL (ref 1.4–7.0)
Neutrophils: 50 %
Platelets: 325 10*3/uL (ref 150–379)
RBC: 3.86 x10E6/uL (ref 3.77–5.28)
RDW: 14.4 % (ref 12.3–15.4)
RPR: NONREACTIVE
RUBELLA: 6.63 {index} (ref >0.99)
Rh Factor: POSITIVE
WBC: 5.7 10*3/uL (ref 3.4–10.8)

## 2016-09-15 LAB — MATERNIT21 PLUS CORE+SCA
CHROMOSOME 18: NEGATIVE
CHROMOSOME 21: NEGATIVE
Chromosome 13: NEGATIVE
PDF: 0
Y Chromosome: NOT DETECTED

## 2016-09-15 LAB — HEMOGLOBINOPATHY EVALUATION
HEMOGLOBIN F QUANTITATION: 0 % (ref 0.0–2.0)
HGB A: 97.4 % (ref 96.4–98.8)
HGB C: 0 %
HGB S: 0 %
HGB VARIANT: 0 %
Hemoglobin A2 Quantitation: 2.6 % (ref 1.8–3.2)

## 2016-09-15 LAB — HEMOGLOBIN A1C
ESTIMATED AVERAGE GLUCOSE: 117 mg/dL
HEMOGLOBIN A1C: 5.7 % — AB (ref 4.8–5.6)

## 2016-09-15 LAB — TSH: TSH: 1.89 u[IU]/mL (ref 0.450–4.500)

## 2016-09-15 LAB — VARICELLA ZOSTER ANTIBODY, IGG: Varicella zoster IgG: 175 index (ref >165)

## 2016-09-16 ENCOUNTER — Other Ambulatory Visit: Payer: Self-pay | Admitting: Certified Nurse Midwife

## 2016-09-16 ENCOUNTER — Encounter: Payer: BLUE CROSS/BLUE SHIELD | Admitting: Obstetrics and Gynecology

## 2016-09-16 ENCOUNTER — Ambulatory Visit (INDEPENDENT_AMBULATORY_CARE_PROVIDER_SITE_OTHER): Payer: BLUE CROSS/BLUE SHIELD

## 2016-09-16 ENCOUNTER — Ambulatory Visit (INDEPENDENT_AMBULATORY_CARE_PROVIDER_SITE_OTHER): Payer: Self-pay | Admitting: Family Medicine

## 2016-09-16 VITALS — BP 126/74 | HR 88 | Temp 98.2°F | Resp 17 | Ht 63.5 in | Wt 170.0 lb

## 2016-09-16 DIAGNOSIS — R7309 Other abnormal glucose: Secondary | ICD-10-CM | POA: Insufficient documentation

## 2016-09-16 DIAGNOSIS — D649 Anemia, unspecified: Secondary | ICD-10-CM | POA: Diagnosis not present

## 2016-09-16 DIAGNOSIS — J329 Chronic sinusitis, unspecified: Secondary | ICD-10-CM | POA: Diagnosis not present

## 2016-09-16 DIAGNOSIS — R059 Cough, unspecified: Secondary | ICD-10-CM

## 2016-09-16 DIAGNOSIS — R509 Fever, unspecified: Secondary | ICD-10-CM

## 2016-09-16 DIAGNOSIS — R05 Cough: Secondary | ICD-10-CM

## 2016-09-16 DIAGNOSIS — O09529 Supervision of elderly multigravida, unspecified trimester: Secondary | ICD-10-CM

## 2016-09-16 LAB — POCT CBC
Granulocyte percent: 41.8 %G (ref 37–80)
HEMATOCRIT: 24.3 % — AB (ref 37.7–47.9)
HEMOGLOBIN: 8.4 g/dL — AB (ref 12.2–16.2)
LYMPH, POC: 2.5 (ref 0.6–3.4)
MCH, POC: 30 pg (ref 27–31.2)
MCHC: 34.6 g/dL (ref 31.8–35.4)
MCV: 86.8 fL (ref 80–97)
MID (cbc): 0.1 (ref 0–0.9)
MPV: 6.9 fL (ref 0–99.8)
POC GRANULOCYTE: 1.9 — AB (ref 2–6.9)
POC LYMPH %: 55 % — AB (ref 10–50)
POC MID %: 3.2 % (ref 0–12)
Platelet Count, POC: 321 10*3/uL (ref 142–424)
RBC: 2.79 M/uL — AB (ref 4.04–5.48)
RDW, POC: 13.8 %
WBC: 4.6 10*3/uL (ref 4.6–10.2)

## 2016-09-16 MED ORDER — AMOXICILLIN 875 MG PO TABS: 875.0000 mg | ORAL_TABLET | Freq: Two times a day (BID) | ORAL | 0 refills | Status: DC

## 2016-09-16 MED ORDER — DOCUSATE SODIUM 100 MG PO CAPS: 100.0000 mg | ORAL_CAPSULE | Freq: Two times a day (BID) | ORAL | 0 refills | Status: DC

## 2016-09-16 MED ORDER — ALBUTEROL SULFATE (2.5 MG/3ML) 0.083% IN NEBU: 2.5000 mg | INHALATION_SOLUTION | Freq: Four times a day (QID) | RESPIRATORY_TRACT | 1 refills | Status: DC | PRN

## 2016-09-16 MED ORDER — DICLOFENAC SODIUM 1 % TD GEL: 2.0000 g | Freq: Two times a day (BID) | TRANSDERMAL | 1 refills | Status: DC | PRN

## 2016-09-16 MED ORDER — FERROUS SULFATE 325 (65 FE) MG PO TABS: 325.0000 mg | ORAL_TABLET | Freq: Two times a day (BID) | ORAL | 3 refills | Status: DC

## 2016-09-16 MED ORDER — ALBUTEROL SULFATE (2.5 MG/3ML) 0.083% IN NEBU
2.5000 mg | INHALATION_SOLUTION | Freq: Once | RESPIRATORY_TRACT | Status: AC
  Administered 2016-09-16: 2.5 mg via RESPIRATORY_TRACT

## 2016-09-16 NOTE — Patient Instructions (Addendum)
You do have a sinus infection.  For this, take the amoxicillin twice daily for 10 days.  For your neck pain and stiffness use the Voltaren gel.  Your blood levels are low today. Take the iron twice a day. It is best if he can take this 3 times a day but it may make you constipated. He is the prune juice and stool softener Colace to help prevent constipation.  I have sent in medicine for your nebulizer as well. Use this 2-3 times a day as needed for worsening cough.  Come back to see us in 4 weeks to recheck your hemoglobin/blood level.  It was good to meet you today!    IF you received an x-ray today, you will receive an invoice from [facility]. Please contact [facility] at [phone] with questions or concerns regarding your invoice.   IF you received labwork today, you will receive an invoice from [facility]. Please contact [facility] at [phone] with questions or concerns regarding your invoice.   Our billing staff will not be able to assist you with questions regarding bills from these companies.  You will be contacted with the lab results as soon as they are available. The fastest way to get your results is to activate your My Chart account. Instructions are located on the last page of this paperwork. If you have not heard from us regarding the results in 2 weeks, please contact this office.

## 2016-09-16 NOTE — Progress Notes (Signed)
Gina Chung is a 41 y.o. female who presents to [facility] today for cough and headache:  1.  Cough and headache:  Symptoms started about a week and a half ago. She has also had concurrent lightheadedness with standing. Of note she had recent missed abortion with D&C on December 29 which was about 5 days ago. She has had fairly heavy bleeding for the past month or so and is been diagnosed with anemia.  She has had no further bleeding since her D&C. Her cough is dry and hacking. Worse at night. She has no history of asthma. She is a nonsmoker. She has had some initial URI symptoms about 2 weeks ago and now has persistent sinus pressure and congestion, accompanied by cough. No fevers or chills. No nausea vomiting.  For her headache she describes bifrontal aching mostly around her eyes. She also some tenderness in the occipital region of her head. No neck stiffness. No altered mental status. No vision changes.  This is better when she takes over-the-counter ibuprofen.  ROS as above.  Marland Kitchen.   PMH reviewed. Patient is a nonsmoker.   Past Medical History:  Diagnosis Date  . Anemia   . History of UTI   . Language barrier    arabic and english  . Migraine    Past Surgical History:  Procedure Laterality Date  . COLPOSCOPY    . DILATION AND EVACUATION N/A 09/11/2016   Procedure: DILATATION AND EVACUATION;  Surgeon: Hermina StaggersMichael L Ervin, MD;  Location: [facility];  Service: Gynecology;  Laterality: N/A;    Medications reviewed. Current Outpatient Prescriptions  Medication Sig Dispense Refill  . ibuprofen (ADVIL,MOTRIN) 800 MG tablet Take 1 tablet (800 mg total) by mouth every 8 (eight) hours as needed. 30 tablet 0  . Prenatal Vit-Fe Fumarate-FA (MULTIVITAMIN-PRENATAL) 27-0.8 MG TABS tablet Take 1 tablet by mouth daily at 12 noon.    . butalbital-acetaminophen-caffeine (FIORICET, ESGIC) 50-325-40 MG tablet Take 1-2 tablets by mouth every 6 (six) hours as needed for headache. (Patient  not taking: Reported on 09/16/2016) 45 tablet 4  . oxyCODONE (OXY IR/ROXICODONE) 5 MG immediate release tablet Take 1-2 tablets (5-10 mg total) by mouth every 6 (six) hours as needed for severe pain. (Patient not taking: Reported on 09/16/2016) 24 tablet 0  . oxyCODONE-acetaminophen (PERCOCET/ROXICET) 5-325 MG tablet Take 1-2 tablets by mouth every 6 (six) hours as needed. (Patient not taking: Reported on 09/16/2016) 10 tablet 0   No current facility-administered medications for this visit.    Facility-Administered Medications Ordered in Other Visits  Medication Dose Route Frequency Provider Last Rate Last Dose  . misoprostol (CYTOTEC) tablet 200 mcg  200 mcg Rectal Once Hermina StaggersMichael L Ervin, MD         Physical Exam:  BP 126/74 (BP Location: Right Arm, Patient Position: Sitting, Cuff Size: Normal)   Pulse 88   Temp 98.2 F (36.8 C) (Oral)   Resp 17   Ht 5' 3.5" (1.613 m)   Wt 170 lb (77.1 kg)   LMP 06/17/2016   SpO2 100%   Breastfeeding? No   BMI 29.64 kg/m  Gen:  Patient sitting on exam table, appears stated age in no acute distress Head: Normocephalic atraumatic Eyes: EOMI, PERRL, sclera and conjunctiva non-erythematous Ears:  Canals clear bilaterally.  TMs pearly gray bilaterally without erythema or bulging.   Nose:  Nasal turbinates grossly enlarged bilaterally. Some exudates noted. Tender to palpation of maxillary sinus  Mouth: Mucosa membranes moist. Tonsils +  2, nonenlarged, non-erythematous. Neck: No cervical lymphadenopathy noted.  Tenderness to palpation right at base of occiput BL paracervical muscles.   Heart:  RRR, no murmurs auscultated. Pulm:  Wheezing in BL lower lungs  Results for orders placed or performed in visit on 09/16/16  POCT CBC  Result Value Ref Range   WBC 4.6 4.6 - 10.2 K/uL   Lymph, poc 2.5 0.6 - 3.4   POC LYMPH PERCENT 55.0 (A) 10 - 50 %L   MID (cbc) 0.1 0 - 0.9   POC MID % 3.2 0 - 12 %M   POC Granulocyte 1.9 (A) 2 - 6.9   Granulocyte percent 41.8 37 -  80 %G   RBC 2.79 (A) 4.04 - 5.48 M/uL   Hemoglobin 8.4 (A) 12.2 - 16.2 g/dL   HCT, POC 16.1 (A) 09.6 - 47.9 %   MCV 86.8 80 - 97 fL   MCH, POC 30.0 27 - 31.2 pg   MCHC 34.6 31.8 - 35.4 g/dL   RDW, POC 04.5 %   Platelet Count, POC 321 142 - 424 K/uL   MPV 6.9 0 - 99.8 fL     Assessment and Plan:  1.  Cough:  - likely some post-bronchitis changes.  - She didn't receive any albuterol treatment here with much improvement in her wheezing and air movement. -Her daughter has a nebulizer at home and therefore I sent in some albuterol for her to use through her daughter's machine. -She has no diagnosis of asthma. She had PFTs in the next 4-6 weeks.  #2. Headache: -No red flags. -Seems to be triggered accommodation neck pain.  Perhaps worsened by anemia.   - Ibuprofen has been helping, continue this.   3.  Anemia:  - continues to drop since D&C.   - iron supplementation.  Prune juice and colace for anticipated constipation.  - FU in 2-3 weeks for Hgb recheck.  Sooner if HA or lightheadedness do not improve.   4.  Sinus infection: - treat with amoxicillin.  Treatment of this should help HA.

## 2016-09-17 ENCOUNTER — Ambulatory Visit (INDEPENDENT_AMBULATORY_CARE_PROVIDER_SITE_OTHER): Payer: BLUE CROSS/BLUE SHIELD | Admitting: Family Medicine

## 2016-09-17 ENCOUNTER — Encounter: Payer: Self-pay | Admitting: Obstetrics & Gynecology

## 2016-09-17 VITALS — BP 121/83 | HR 90 | Temp 97.7°F | Wt 176.4 lb

## 2016-09-17 DIAGNOSIS — Z9889 Other specified postprocedural states: Secondary | ICD-10-CM | POA: Diagnosis not present

## 2016-09-17 DIAGNOSIS — Z30011 Encounter for initial prescription of contraceptive pills: Secondary | ICD-10-CM | POA: Diagnosis not present

## 2016-09-17 DIAGNOSIS — O021 Missed abortion: Secondary | ICD-10-CM

## 2016-09-17 MED ORDER — NORETHIN ACE-ETH ESTRAD-FE 1-20 MG-MCG(24) PO TABS: 1.0000 | ORAL_TABLET | Freq: Every day | ORAL | 11 refills | Status: DC

## 2016-09-17 MED ORDER — ETONOGESTREL-ETHINYL ESTRADIOL 0.12-0.015 MG/24HR VA RING: VAGINAL_RING | VAGINAL | 12 refills | Status: DC

## 2016-09-17 NOTE — Progress Notes (Signed)
CLINIC ENCOUNTER NOTE  History:  41 y.o. [id number] here today for follow up after D&E for SAB.  Patient had a D&E performed on 09/11/16 after a 12 week SAB with retained products. Patient has not had any bleeding since the procedure. Denies F/C or abdominal pain/tenderness. Does not desire pregnancy at this time, would like to start contraception.  Past Medical History:  Diagnosis Date  . Anemia   . History of UTI   . Language barrier    arabic and english  . Migraine     Past Surgical History:  Procedure Laterality Date  . COLPOSCOPY    . DILATION AND EVACUATION N/A 09/11/2016   Procedure: DILATATION AND EVACUATION;  Surgeon: Hermina StaggersMichael L Ervin, MD;  Location: [facility];  Service: Gynecology;  Laterality: N/A;    The following portions of the patient's history were reviewed and updated as appropriate: allergies, current medications, past family history, past medical history, past social history, past surgical history and problem list.    Review of Systems:  See above; comprehensive review of systems was otherwise negative.   Objective:  Physical Exam BP 121/83   Pulse 90   Temp 97.7 F (36.5 C) (Oral)   Wt 176 lb 6.4 oz (80 kg)   LMP 06/17/2016   BMI 30.76 kg/m  CONSTITUTIONAL: Well-developed, well-nourished female in no acute distress.  HENT:  Normocephalic, atraumatic SKIN: Skin is warm and dry.  NEUROLGIC: Alert  PSYCHIATRIC: Normal mood and affect.  CARDIOVASCULAR: Normal heart rate noted RESPIRATORY: Effort and breath sounds normal, no problems with respiration noted ABDOMEN: Soft, no distention noted.  No tenderness, rebound or guarding.    Labs and Imaging Dg Chest 2 View  Result Date: 09/16/2016 CLINICAL DATA:  Cough, fever for 1 week EXAM: CHEST  2 VIEW COMPARISON:  Chest x-ray of 04/26/2013 FINDINGS: No active infiltrate or effusion is seen. Mediastinal and hilar contours are unremarkable. The heart is within normal limits in size. No bony abnormality is  seen. IMPRESSION: No active cardiopulmonary disease. Electronically Signed   By: Dwyane DeePaul  Barry M.D.   On: 09/16/2016 15:43   [place] Ob Comp Less 14 Wks  Result Date: 09/11/2016 CLINICAL DATA:  Missed abortion. Heavy vaginal bleeding. Cytotec given yesterday. Quantitative beta HCG was 61,259 on 09/03/2016. By report, a gestational sac with fetal pole was identified on an outside ultrasound exam performed yesterday. EXAM: OBSTETRIC <14 WK ULTRASOUND TECHNIQUE: Transabdominal ultrasound was performed for evaluation of the gestation as well as the maternal uterus and adnexal regions. COMPARISON:  None available. FINDINGS: Intrauterine gestational sac: Not seen Yolk sac:  Not seen Embryo:  Not seen Cardiac Activity: Absent Subchorionic hemorrhage: None visualized. Thick, heterogeneous endometrial contents contain blood flow on Doppler evaluation. Endometrial thickening/ soft tissue is estimated to be 4.0 cm. Maternal uterus/adnexae: The right ovary is not visualized. Left ovary has a normal appearance. IMPRESSION: 1. No intrauterine pregnancy identified. 2. Heterogeneous endometrial contents compatible with retained products of conception. Electronically Signed   By: Norva PavlovElizabeth  Brown M.D.   On: 09/11/2016 17:35    Assessment & Plan:   1. Missed abortion - Missed AB at 12 weeks (measuring 5.5 weeks), went for D&E - Would like to start birth control, reviewed all forms, would like the OCP she was on previously with iron included (LoEstrin Fe)  2. S/P dilation and curettage - Had a D&E performed 09/11/16 for retained products of conception   Routine preventative health maintenance measures emphasized.   Jen MowElizabeth Kandy Towery, DO OB/GYN Fellow  [facility]

## 2016-09-17 NOTE — Patient Instructions (Signed)
Contraception Choices Birth control (contraception) is the use of any methods or devices to stop pregnancy from happening. Below are some methods to help avoid pregnancy. Hormonal birth control  A small tube put under the skin of the upper arm (implant). The tube can stay in place for 3 years. The implant must be taken out after 3 years.  Shots given every 3 months.  Pills taken every day.  Patches that are changed once a week.  A ring put into the vagina (vaginal ring). The ring is left in place for 3 weeks and removed for 1 week. Then, a new ring is put in the vagina.  Emergency birth control pills taken after unprotected sex (intercourse). Barrier birth control  A thin covering worn on the penis (female condom) during sex.  A soft, loose covering put into the vagina (female condom) before sex.  A rubber bowl that sits over the cervix (diaphragm). The bowl must be made for you. The bowl is put into the vagina before sex. The bowl is left in place for 6 to 8 hours after sex.  A small, soft cup that fits over the cervix (cervical cap). The cup must be made for you. The cup can be left in place for 48 hours after sex.  A sponge that is put into the vagina before sex.  A chemical that kills or stops sperm from getting into the cervix and uterus (spermicide). The chemical may be a cream, jelly, foam, or pill. Intrauterine (IUD) birth control  IUD birth control is a small, T-shaped piece of plastic. The plastic is put inside the uterus. There are 2 types of IUD:  Copper IUD. The IUD is covered in copper wire. The copper makes a fluid that kills sperm. It can stay in place for 10 years.  Hormone IUD. The hormone stops pregnancy from happening. It can stay in place for 5 years. Permanent methods  When the woman has her fallopian tubes sealed, tied, or blocked during surgery. This stops the egg from traveling to the uterus.  The doctor places a small coil or insert into each fallopian  tube. This causes scar tissue to form and blocks the fallopian tubes.  When the female has the tubes that carry sperm tied off (vasectomy). Natural family planning birth control  Natural family planning means not having sex or using barrier birth control on the days the woman could become pregnant.  Use a calendar to keep track of the length of each period and know the days she can get pregnant.  Avoid sex during ovulation.  Use a thermometer to measure body temperature. Also watch for symptoms of ovulation.  Time sex to be after the woman has ovulated. Use condoms to help protect yourself against sexually transmitted infections (STIs). Do this no matter what type of birth control you use. Talk to your doctor about which type of birth control is best for you. This information is not intended to replace advice given to you by your health care provider. Make sure you discuss any questions you have with your health care provider. Document Released: 06/28/2009 Document Revised: 02/06/2016 Document Reviewed: 03/22/2013 Elsevier Interactive Patient Education  2017 Elsevier Inc.  

## 2016-09-17 NOTE — Progress Notes (Signed)
Patient is in the office for follow up after missed abortion.

## 2016-10-01 ENCOUNTER — Other Ambulatory Visit: Payer: BLUE CROSS/BLUE SHIELD

## 2016-10-01 ENCOUNTER — Encounter: Payer: BLUE CROSS/BLUE SHIELD | Admitting: Certified Nurse Midwife

## 2016-11-18 ENCOUNTER — Encounter (HOSPITAL_COMMUNITY): Payer: Self-pay | Admitting: *Deleted

## 2016-11-18 ENCOUNTER — Ambulatory Visit (HOSPITAL_COMMUNITY)
Admission: EM | Admit: 2016-11-18 | Discharge: 2016-11-18 | Disposition: A | Discharge status: Home / Self Care | Payer: Medicaid Other | Attending: Family Medicine | Admitting: Family Medicine

## 2016-11-18 DIAGNOSIS — R110 Nausea: Secondary | ICD-10-CM

## 2016-11-18 DIAGNOSIS — J069 Acute upper respiratory infection, unspecified: Secondary | ICD-10-CM

## 2016-11-18 DIAGNOSIS — J40 Bronchitis, not specified as acute or chronic: Secondary | ICD-10-CM

## 2016-11-18 DIAGNOSIS — B9789 Other viral agents as the cause of diseases classified elsewhere: Secondary | ICD-10-CM | POA: Diagnosis not present

## 2016-11-18 DIAGNOSIS — R1011 Right upper quadrant pain: Secondary | ICD-10-CM

## 2016-11-18 MED ORDER — IPRATROPIUM BROMIDE 0.06 % NA SOLN: 2.0000 | Freq: Four times a day (QID) | NASAL | 0 refills | Status: DC

## 2016-11-18 MED ORDER — ONDANSETRON 4 MG PO TBDP: 4.0000 mg | ORAL_TABLET | Freq: Three times a day (TID) | ORAL | 0 refills | Status: DC | PRN

## 2016-11-18 MED ORDER — AZITHROMYCIN 250 MG PO TABS: 250.0000 mg | ORAL_TABLET | Freq: Every day | ORAL | 0 refills | Status: DC

## 2016-11-18 MED ORDER — BENZONATATE 100 MG PO CAPS: 100.0000 mg | ORAL_CAPSULE | Freq: Three times a day (TID) | ORAL | 0 refills | Status: DC

## 2016-11-18 NOTE — ED Triage Notes (Signed)
Cough    Congested   X  10  Days   Worse over  Last  4  Days      Feels     Weak   Pain in  Chest  When  She  Coughs

## 2016-11-18 NOTE — Discharge Instructions (Signed)
Please go to ED if abdominal or chest pain returns

## 2016-11-18 NOTE — ED Provider Notes (Signed)
CSN: [id number]     Arrival date & time 11/18/16  1037 History   First MD Initiated Contact with Patient 11/18/16 1148     Chief Complaint  Patient presents with  . Cough   (Consider location/radiation/quality/duration/timing/severity/associated sxs/prior Treatment) Patient c/o cough and uri sx's for last 4 days.   The history is provided by the patient.  Cough  Cough characteristics:  Productive Sputum characteristics:  White Severity:  Mild Onset quality:  Sudden Duration:  4 weeks Timing:  Constant Chronicity:  New Smoker: no   Relieved by:  Nothing Worsened by:  Nothing   Past Medical History:  Diagnosis Date  . Anemia   . History of UTI   . Language barrier    arabic and english  . Migraine    Past Surgical History:  Procedure Laterality Date  . COLPOSCOPY    . DILATION AND EVACUATION N/A 09/11/2016   Procedure: DILATATION AND EVACUATION;  Surgeon: Hermina StaggersMichael L Ervin, MD;  Location: [facility];  Service: Gynecology;  Laterality: N/A;   Family History  Problem Relation Age of Onset  . Rheum arthritis Mother   . Hypertension Mother   . Hypertension Father   . Diabetes Maternal Grandfather    Social History  Substance Use Topics  . Smoking status: Never Smoker  . Smokeless tobacco: Never Used  . Alcohol use No   OB History    Gravida Para Term Preterm AB Living   6 4 4  0 2 4   SAB TAB Ectopic Multiple Live Births   2 0 0 0 4     Review of Systems  Constitutional: Negative.   HENT: Negative.   Eyes: Negative.   Respiratory: Positive for cough.   Cardiovascular: Negative.   Gastrointestinal: Negative.   Endocrine: Negative.   Genitourinary: Negative.   Musculoskeletal: Negative.   Allergic/Immunologic: Negative.   Neurological: Negative.   Hematological: Negative.     Allergies  Patient has no known allergies.  Home Medications   Prior to Admission medications   Medication Sig Start Date End Date Taking? Authorizing Provider   Pseudoeph-Doxylamine-DM-APAP (NYQUIL PO) Take by mouth.   Yes Historical Provider, MD  albuterol (PROVENTIL) (2.5 MG/3ML) 0.083% nebulizer solution Take 3 mLs (2.5 mg total) by nebulization every 6 (six) hours as needed for wheezing or shortness of breath. Patient not taking: Reported on 09/17/2016 09/16/16   Tobey GrimJeffrey H Walden, MD  amoxicillin (AMOXIL) 875 MG tablet Take 1 tablet (875 mg total) by mouth 2 (two) times daily. 09/16/16   Tobey GrimJeffrey H Walden, MD  azithromycin (ZITHROMAX) 250 MG tablet Take 1 tablet (250 mg total) by mouth daily. Take first 2 tablets together, then 1 every day until finished. 11/18/16   Deatra CanterWilliam J Abegail Kloeppel, FNP  benzonatate (TESSALON) 100 MG capsule Take 1 capsule (100 mg total) by mouth every 8 (eight) hours. 11/18/16   Deatra CanterWilliam J Caia Lofaro, FNP  butalbital-acetaminophen-caffeine (FIORICET, ESGIC) [phone] MG tablet Take 1-2 tablets by mouth every 6 (six) hours as needed for headache. Patient not taking: Reported on 09/17/2016 09/03/16   Rachelle A Denney, CNM  diclofenac sodium (VOLTAREN) 1 % GEL Apply 2 g topically 2 (two) times daily as needed. Patient not taking: Reported on 09/17/2016 09/16/16   Tobey GrimJeffrey H Walden, MD  docusate sodium (COLACE) 100 MG capsule Take 1 capsule (100 mg total) by mouth 2 (two) times daily. Patient not taking: Reported on 09/17/2016 09/16/16   Tobey GrimJeffrey H Walden, MD  ferrous sulfate 325 (65 FE) MG tablet Take 1  tablet (325 mg total) by mouth 2 (two) times daily with a meal. 09/16/16   Tobey Grim, MD  ibuprofen (ADVIL,MOTRIN) 800 MG tablet Take 1 tablet (800 mg total) by mouth every 8 (eight) hours as needed. 09/11/16   [name], MD  ipratropium (ATROVENT) 0.06 % nasal spray Place 2 sprays into both nostrils 4 (four) times daily. 11/18/16   Deatra Canter, FNP  Norethindrone Acetate-Ethinyl Estrad-FE (LOESTRIN 24 FE) 1-20 MG-MCG(24) tablet Take 1 tablet by mouth daily. 09/17/16   Elizabeth Woodland Mumaw, DO  ondansetron (ZOFRAN ODT) 4 MG disintegrating tablet  Take 1 tablet (4 mg total) by mouth every 8 (eight) hours as needed for nausea or vomiting. 11/18/16   Deatra Canter, FNP  oxyCODONE (OXY IR/ROXICODONE) 5 MG immediate release tablet Take 1-2 tablets (5-10 mg total) by mouth every 6 (six) hours as needed for severe pain. Patient not taking: Reported on 09/17/2016 09/11/16   [name], MD  oxyCODONE-acetaminophen (PERCOCET/ROXICET) 5-325 MG tablet Take 1-2 tablets by mouth every 6 (six) hours as needed. 09/10/16   Adam Phenix, MD  Prenatal Vit-Fe Fumarate-FA (MULTIVITAMIN-PRENATAL) 27-0.8 MG TABS tablet Take 1 tablet by mouth daily at 12 noon.    Historical Provider, MD   Meds Ordered and Administered this Visit  Medications - No data to display  BP 138/90 (BP Location: Right Arm)   Pulse 118   Temp 99.2 F (37.3 C) (Oral)   Resp 20   LMP 11/12/2016   SpO2 99%  No data found.   Physical Exam  Constitutional: She appears well-developed and well-nourished.  HENT:  Head: Normocephalic.  Right Ear: External ear normal.  Left Ear: External ear normal.  Mouth/Throat: Oropharynx is clear and moist.  Eyes: Conjunctivae and EOM are normal. Pupils are equal, round, and reactive to light.  Neck: Normal range of motion. Neck supple.  Cardiovascular: Normal rate, regular rhythm and normal heart sounds.   Pulmonary/Chest: Effort normal and breath sounds normal.  Nursing note and vitals reviewed.   Urgent Care Course     Procedures (including critical care time)  Labs Review Labs Reviewed - No data to display  Imaging Review No results found.   Visual Acuity Review  Right Eye Distance:   Left Eye Distance:   Bilateral Distance:    Right Eye Near:   Left Eye Near:    Bilateral Near:         MDM   1. Viral URI with cough   2. Bronchitis   3. Nausea   4. Right upper quadrant abdominal pain    Discussed with patient to follow up with pcp for [place] may have gallstones.  Explained if abdominal pain returns and is  persistent of fever then go to ED.  Zofran zpak Tessalon perles Atrovent nasal spray    Deatra Canter, FNP 11/18/16 2046

## 2018-01-03 ENCOUNTER — Ambulatory Visit (INDEPENDENT_AMBULATORY_CARE_PROVIDER_SITE_OTHER): Payer: BLUE CROSS/BLUE SHIELD | Admitting: Physician Assistant

## 2018-01-03 ENCOUNTER — Other Ambulatory Visit: Payer: Self-pay

## 2018-01-03 VITALS — BP 124/76 | HR 77 | Temp 99.1°F | Resp 16 | Ht 63.5 in | Wt 163.0 lb

## 2018-01-03 DIAGNOSIS — Z30011 Encounter for initial prescription of contraceptive pills: Secondary | ICD-10-CM

## 2018-01-03 DIAGNOSIS — Z13228 Encounter for screening for other metabolic disorders: Secondary | ICD-10-CM | POA: Diagnosis not present

## 2018-01-03 DIAGNOSIS — Z0000 Encounter for general adult medical examination without abnormal findings: Secondary | ICD-10-CM

## 2018-01-03 DIAGNOSIS — Z113 Encounter for screening for infections with a predominantly sexual mode of transmission: Secondary | ICD-10-CM | POA: Diagnosis not present

## 2018-01-03 DIAGNOSIS — Z1321 Encounter for screening for nutritional disorder: Secondary | ICD-10-CM

## 2018-01-03 DIAGNOSIS — Z130 Encounter for screening for diseases of the blood and blood-forming organs and certain disorders involving the immune mechanism: Secondary | ICD-10-CM | POA: Diagnosis not present

## 2018-01-03 DIAGNOSIS — Z1329 Encounter for screening for other suspected endocrine disorder: Secondary | ICD-10-CM | POA: Diagnosis not present

## 2018-01-03 DIAGNOSIS — Z124 Encounter for screening for malignant neoplasm of cervix: Secondary | ICD-10-CM | POA: Diagnosis not present

## 2018-01-03 LAB — POCT URINE PREGNANCY: Preg Test, Ur: NEGATIVE

## 2018-01-03 MED ORDER — LEVONORGEST-ETH ESTRAD 91-DAY 0.15-0.03 MG PO TABS: 1.0000 | ORAL_TABLET | Freq: Every day | ORAL | 4 refills | Status: DC

## 2018-01-03 NOTE — Patient Instructions (Signed)
You should begin this at your period.   Keeping You Healthy  Get These Tests 1. Blood Pressure- Have your blood pressure checked once a year by your health care provider.  Normal blood pressure is 120/80. 2. Weight- Have your body mass index (BMI) calculated to screen for obesity.  BMI is measure of body fat based on height and weight.  You can also calculate your own BMI at [email or web address]-[email or web address]/[email or web address] 3. Cholesterol- Have your cholesterol checked every 5 years starting at age 40 then yearly starting at age 40. 4. Chlamydia, HIV, and other sexually transmitted diseases- Get screened every year until age 40, then within three months of each new sexual provider. 5. Pap Test - Every 1-5 years; discuss with your health care provider. 6. Mammogram- Every 1-2 years starting at age 40--50  Take these medicines  Calcium with Vitamin D-Your body needs 1200 mg of Calcium each day and [phone] IU of Vitamin D daily.  Your body can only absorb 500 mg of Calcium at a time so Calcium must be taken in 2 or 3 divided doses throughout the day.  Multivitamin with folic acid- Once daily if it is possible for you to become pregnant.  Get these Immunizations  Gardasil-Series of three doses; prevents HPV related illness such as genital warts and cervical cancer.  Menactra-Single dose; prevents meningitis.  Tetanus shot- Every 10 years.  Flu shot-Every year.  Take these steps 1. Do not smoke-Your healthcare provider can help you quit.  For tips on how to quit go to www.smokefree.gov or call 1-800 QUITNOW. 2. Be physically active- Exercise 5 days a week for at least 30 minutes.  If you are not already physically active, start slow and gradually work up to 30 minutes of moderate physical activity.  Examples of moderate activity include walking briskly, dancing, swimming, bicycling, etc. 3. Breast Cancer- A self breast exam every month is important for early detection of breast cancer.  For more information and  instruction on self breast exams, ask your healthcare provider or [email or web address].eswww.womenshealth.gov/faq/breast-self-exam.cfm. 4. Eat a healthy diet- Eat a variety of healthy foods such as fruits, vegetables, whole grains, low fat milk, low fat cheeses, yogurt, lean meats, poultry and fish, beans, nuts, tofu, etc.  For more information go to www. Thenutritionsource.org 5. Drink alcohol in moderation- Limit alcohol intake to one drink or less per day. Never drink and drive. 6. Depression- Your emotional health is as important as your physical health.  If you're feeling down or losing interest in things you normally enjoy please talk to your healthcare provider about being screened for depression. 7. Dental visit- Brush and floss your teeth twice daily; visit your dentist twice a year. 8. Eye doctor- Get an eye exam at least every 2 years. 9. Helmet use- Always wear a helmet when riding a bicycle, motorcycle, rollerblading or skateboarding. 10. Safe sex- If you may be exposed to sexually transmitted infections, use a condom. 11. Seat belts- Seat belts can save your live; always wear one. 12. Smoke/Carbon Monoxide detectors- These detectors need to be installed on the appropriate level of your home. Replace batteries at least once a year. 13. Skin cancer- When out in the sun please cover up and use sunscreen 15 SPF or higher. 14. Violence- If anyone is threatening or hurting you, please tell your healthcare provider.         Ethinyl Estradiol; Levonorgestrel tablets What is this medicine? ETHINYL ESTRADIOL; LEVONORGESTREL (ETH in il es tra DYE ole; LEE voh nor  jes trel) is an oral contraceptive. It combines two types of female hormones, an estrogen and a progestin. They are used to prevent ovulation and pregnancy. This medicine may be used for other purposes; ask your health care provider or pharmacist if you have questions. COMMON BRAND NAME(S): [place], [place], Amethia, Amethia Lo, [place], [place],  [place], Aviane, Camrese, Camrese Lo, [place], [place], [address], [place], [place], [place], [place], [place], [place], [place], [place], [place], [place], [place], Levonorgestrel/Ethinyl Estradiol, [place], [place], [place], [place], [place], [place], [place], [place], [place], [place], [place], [place], [place], [place], [place], [place], [place], Debroah Baller What should I tell my health care provider before I take this medicine? They need to know if you have or ever had any of these conditions: -abnormal vaginal bleeding -blood vessel disease or blood clots -breast, cervical, endometrial, ovarian, liver, or uterine cancer -diabetes -gallbladder disease -heart disease or recent heart attack -high blood pressure -high cholesterol -kidney disease -liver disease -migraine headaches -stroke -systemic lupus erythematosus (SLE) -tobacco smoker -an unusual or allergic reaction to estrogens, progestins, other medicines, foods, dyes, or preservatives -pregnant or trying to get pregnant -breast-feeding How should I use this medicine? Take this medicine by mouth. To reduce nausea, this medicine may be taken with food. Follow the directions on the prescription label. Take this medicine at the same time each day and in the order directed on the package. Do not take your medicine more often than directed. Contact your pediatrician regarding the use of this medicine in children. Special care may be needed. This medicine has been used in female children who have started having menstrual periods. A patient package insert for the product will be given with each prescription and refill. Read this sheet carefully each time. The sheet may change frequently. Overdosage: If you think you have taken too much of this medicine contact a poison control center or emergency room at once. NOTE: This medicine is only for you. Do not share this medicine with others. What if I miss a dose? If you  miss a dose, refer to the patient information sheet you received with your medicine for direction. If you miss more than one pill, this medicine may not be as effective and you may need to use another form of birth control. What may interact with this medicine? Do not take this medicine with the following medication: -dasabuvir; ombitasvir; paritaprevir; ritonavir -ombitasvir; paritaprevir; ritonavir This medicine may also interact with the following medications: -acetaminophen -antibiotics or medicines for infections, especially rifampin, rifabutin, rifapentine, and griseofulvin, and possibly penicillins or tetracyclines -aprepitant -ascorbic acid (vitamin C) -atorvastatin -barbiturate medicines, such as phenobarbital -bosentan -carbamazepine -caffeine -clofibrate -cyclosporine -dantrolene -doxercalciferol -felbamate -grapefruit juice -hydrocortisone -medicines for anxiety or sleeping problems, such as diazepam or temazepam -medicines for diabetes, including pioglitazone -mineral oil -modafinil -mycophenolate -nefazodone -oxcarbazepine -phenytoin -prednisolone -ritonavir or other medicines for HIV infection or AIDS -rosuvastatin -selegiline -soy isoflavones supplements -St. John's wort -tamoxifen or raloxifene -theophylline -thyroid hormones -topiramate -warfarin This list may not describe all possible interactions. Give your health care provider a list of all the medicines, herbs, non-prescription drugs, or dietary supplements you use. Also tell them if you smoke, drink alcohol, or use illegal drugs. Some items may interact with your medicine. What should I watch for while using this medicine? Visit your doctor or health care professional for regular checks on your progress. You will need a regular breast and pelvic exam and Pap smear while on this medicine. Use an additional method of contraception during the first cycle that you take  these tablets. If you have any  reason to think you are pregnant, stop taking this medicine right away and contact your doctor or health care professional. If you are taking this medicine for hormone related problems, it may take several cycles of use to see improvement in your condition. Smoking increases the risk of getting a blood clot or having a stroke while you are taking birth control pills, especially if you are more than 40 years old. You are strongly advised not to smoke. This medicine can make your body retain fluid, making your fingers, hands, or ankles swell. Your blood pressure can go up. Contact your doctor or health care professional if you feel you are retaining fluid. This medicine can make you more sensitive to the sun. Keep out of the sun. If you cannot avoid being in the sun, wear protective clothing and use sunscreen. Do not use sun lamps or tanning beds/booths. If you wear contact lenses and notice visual changes, or if the lenses begin to feel uncomfortable, consult your eye care specialist. In some women, tenderness, swelling, or minor bleeding of the gums may occur. Notify your dentist if this happens. Brushing and flossing your teeth regularly may help limit this. See your dentist regularly and inform your dentist of the medicines you are taking. If you are going to have elective surgery, you may need to stop taking this medicine before the surgery. Consult your health care professional for advice. This medicine does not protect you against HIV infection (AIDS) or any other sexually transmitted diseases. What side effects may I notice from receiving this medicine? Side effects that you should report to your doctor or health care professional as soon as possible: -breast tissue changes or discharge -changes in vaginal bleeding during your period or between your periods -chest pain -coughing up blood -dizziness or fainting spells -headaches or migraines -leg, arm or groin pain -severe or sudden  headaches -stomach pain (severe) -sudden shortness of breath -sudden loss of coordination, especially on one side of the body -speech problems -symptoms of vaginal infection like itching, irritation or unusual discharge -tenderness in the upper abdomen -vomiting -weakness or numbness in the arms or legs, especially on one side of the body -yellowing of the eyes or skin Side effects that usually do not require medical attention (report to your doctor or health care professional if they continue or are bothersome): -breakthrough bleeding and spotting that continues beyond the 3 initial cycles of pills -breast tenderness -mood changes, anxiety, depression, frustration, anger, or emotional outbursts -increased sensitivity to sun or ultraviolet light -nausea -skin rash, acne, or brown spots on the skin -weight gain (slight) This list may not describe all possible side effects. Call your doctor for medical advice about side effects. You may report side effects to FDA at 1-800-FDA-1088. Where should I keep my medicine? Keep out of the reach of children. Store at room temperature between 15 and 30 degrees C (59 and 86 degrees F). Throw away any unused medicine after the expiration date. NOTE: This sheet is a summary. It may not cover all possible information. If you have questions about this medicine, talk to your doctor, pharmacist, or health care provider.  2018 Elsevier/Gold Standard (2016-05-11 07:58:22)

## 2018-01-03 NOTE — Progress Notes (Signed)
PRIMARY [facility]ARE AT [facility], [place] [address] [phone]  Date:  01/03/2018   Name:  Gina Chung   DOB:  Jan 09, 1976   MRN:  [id number]  PCP:  Patient, No Pcp Per    History of Present Illness:  Gina Chung is a 42 y.o. female patient who presents to PCP with  Chief Complaint  Patient presents with  . Annual Exam    pap smear     DIET:She is eating vegetables and meats.  Daily water intake is 6-7 cups of water  Sleep is "ok"  Changed from 1 week to10 days, no heavy bleeding.  She would like to be a birth control.  No history of cancer familial or personal.  No history of blood disorder.  Patient Active Problem List   Diagnosis Date Noted  . Elevated hemoglobin A1c measurement 09/16/2016  . Language barrier 09/03/2016    Past Medical History:  Diagnosis Date  . Anemia   . History of UTI   . Language barrier    arabic and Gina Chung  . Migraine     Past Surgical History:  Procedure Laterality Date  . COLPOSCOPY    . DILATION AND EVACUATION N/A 09/11/2016   Procedure: DILATATION AND EVACUATION;  Surgeon: Chancy Milroy, MD;  Location: [facility];  Service: Gynecology;  Laterality: N/A;    Social History   Tobacco Use  . Smoking status: Never Smoker  . Smokeless tobacco: Never Used  Substance Use Topics  . Alcohol use: No  . Drug use: No    Family History  Problem Relation Age of Onset  . Rheum arthritis Mother   . Hypertension Mother   . Hypertension Father   . Diabetes Maternal Grandfather     No Known Allergies  Medication list has been reviewed and updated.  Current Outpatient Medications on File Prior to Visit  Medication Sig Dispense Refill  . Multiple Vitamin (MULTI-VITAMIN DAILY PO) Take by mouth.     Current Facility-Administered Medications on File Prior to Visit  Medication Dose Route Frequency Provider Last Rate Last Dose  . misoprostol (CYTOTEC) tablet 200 mcg  200 mcg Rectal Once Chancy Milroy, MD        Review of  Systems  Constitutional: Negative for chills and fever.  HENT: Negative for ear discharge, ear pain and sore throat.   Eyes: Negative for blurred vision and double vision.  Respiratory: Negative for cough, shortness of breath and wheezing.   Cardiovascular: Negative for chest pain, palpitations and leg swelling.  Gastrointestinal: Negative for diarrhea, nausea and vomiting.  Genitourinary: Negative for dysuria, frequency and hematuria.  Skin: Negative for itching and rash.  Neurological: Negative for dizziness and headaches.   ROS otherwise unremarkable unless listed above.  Physical Examination: BP 124/76   Pulse 77   Temp 99.1 F (37.3 C) (Oral)   Resp 16   Ht 5' 3.5" (1.613 m)   Wt 163 lb (73.9 kg)   LMP 12/13/2017   SpO2 100%   BMI 28.42 kg/m  Ideal Body Weight: Weight in (lb) to have BMI = 25: 143.1  Physical Exam  Constitutional: She is oriented to person, place, and time. She appears well-developed and well-nourished. No distress.  HENT:  Head: Normocephalic and atraumatic.  Right Ear: Tympanic membrane, external ear and ear canal normal.  Left Ear: Tympanic membrane, external ear and ear canal normal.  Nose: Right sinus exhibits no maxillary sinus tenderness and no frontal sinus tenderness. Left  sinus exhibits no maxillary sinus tenderness and no frontal sinus tenderness.  Mouth/Throat: Oropharynx is clear and moist. No uvula swelling. No oropharyngeal exudate, posterior oropharyngeal edema or posterior oropharyngeal erythema.  Eyes: Pupils are equal, round, and reactive to light. Conjunctivae and EOM are normal.  Neck: Normal range of motion. Neck supple. No thyromegaly present.  Cardiovascular: Normal rate, regular rhythm, normal heart sounds and intact distal pulses. Exam reveals no gallop, no distant heart sounds and no friction rub.  No murmur heard. Pulmonary/Chest: Effort normal and breath sounds normal. No respiratory distress. She has no decreased breath  sounds. She has no wheezes. She has no rhonchi.  Abdominal: Soft. Bowel sounds are normal. She exhibits no distension and no mass. There is no tenderness.  Genitourinary: Vagina normal and uterus normal. Cervix exhibits no motion tenderness and no friability. Right adnexum displays no mass and no tenderness. Left adnexum displays no mass and no tenderness.  Musculoskeletal: Normal range of motion. She exhibits no edema or tenderness.  Lymphadenopathy:       Head (right side): No submandibular, no tonsillar, no preauricular and no posterior auricular adenopathy present.       Head (left side): No submandibular, no tonsillar, no preauricular and no posterior auricular adenopathy present.    She has no cervical adenopathy.  Neurological: She is alert and oriented to person, place, and time. No cranial nerve deficit. She exhibits normal muscle tone. Coordination normal.  Skin: Skin is warm and dry. She is not diaphoretic.  Psychiatric: She has a normal mood and affect. Her behavior is normal.    Assessment and Plan: Gina Chung is a 42 y.o. female who is here today for cc of  Chief Complaint  Patient presents with  . Annual Exam    pap smear  starting seasonique.  Precautions are given. Labs pending Annual physical exam - Plan: CBC, CMP14+EGFR, TSH, VITAMIN D 25 Hydroxy (Vit-D Deficiency, Fractures), HIV antibody, RPR, POCT urine pregnancy, Pap IG, CT/NG w/ reflex HPV when ASC-U, levonorgestrel-ethinyl estradiol (SEASONALE,INTROVALE,JOLESSA) 0.15-0.03 MG tablet  Screening for thyroid disorder - Plan: TSH  Screening for metabolic disorder - Plan: CMP14+EGFR  Encounter for vitamin deficiency screening - Plan: VITAMIN D 25 Hydroxy (Vit-D Deficiency, Fractures)  Screening for STD (sexually transmitted disease) - Plan: HIV antibody, RPR  Screening for deficiency anemia - Plan: CBC  Screening for cervical cancer - Plan: Pap IG, CT/NG w/ reflex HPV when ASC-U  Encounter for initial  prescription of contraceptive pills - Plan: levonorgestrel-ethinyl estradiol (SEASONALE,INTROVALE,JOLESSA) 0.15-0.03 MG tablet  Ivar Drape, PA-C [facility] 4/24/20197:49 AM

## 2018-01-04 LAB — CMP14+EGFR
A/G RATIO: 1.5 (ref 1.2–2.2)
ALT: 17 IU/L (ref 0–32)
AST: 18 IU/L (ref 0–40)
Albumin: 4.5 g/dL (ref 3.5–5.5)
Alkaline Phosphatase: 82 IU/L (ref 39–117)
BILIRUBIN TOTAL: 0.3 mg/dL (ref 0.0–1.2)
BUN/Creatinine Ratio: 11 (ref 9–23)
BUN: 7 mg/dL (ref 6–20)
CHLORIDE: 100 mmol/L (ref 96–106)
CO2: 21 mmol/L (ref 20–29)
Calcium: 9.9 mg/dL (ref 8.7–10.2)
Creatinine, Ser: 0.65 mg/dL (ref 0.57–1.00)
GFR calc Af Amer: 129 mL/min/{1.73_m2} (ref >59)
GFR calc non Af Amer: 112 mL/min/{1.73_m2} (ref >59)
GLUCOSE: 81 mg/dL (ref 65–99)
Globulin, Total: 3.1 g/dL (ref 1.5–4.5)
POTASSIUM: 4 mmol/L (ref 3.5–5.2)
Sodium: 138 mmol/L (ref 134–144)
TOTAL PROTEIN: 7.6 g/dL (ref 6.0–8.5)

## 2018-01-04 LAB — CBC
Hematocrit: 37 % (ref 34.0–46.6)
Hemoglobin: 12.1 g/dL (ref 11.1–15.9)
MCH: 29 pg (ref 26.6–33.0)
MCHC: 32.7 g/dL (ref 31.5–35.7)
MCV: 89 fL (ref 79–97)
PLATELETS: 379 10*3/uL (ref 150–379)
RBC: 4.17 x10E6/uL (ref 3.77–5.28)
RDW: 15.9 % — AB (ref 12.3–15.4)
WBC: 6.2 10*3/uL (ref 3.4–10.8)

## 2018-01-04 LAB — RPR: RPR: NONREACTIVE

## 2018-01-04 LAB — HIV ANTIBODY (ROUTINE TESTING W REFLEX): HIV Screen 4th Generation wRfx: NONREACTIVE

## 2018-01-04 LAB — VITAMIN D 25 HYDROXY (VIT D DEFICIENCY, FRACTURES): VIT D 25 HYDROXY: 34 ng/mL (ref 30.0–100.0)

## 2018-01-04 LAB — TSH: TSH: 3.22 u[IU]/mL (ref 0.450–4.500)

## 2018-01-05 ENCOUNTER — Encounter: Payer: Self-pay | Admitting: Physician Assistant

## 2018-01-05 LAB — PAP IG, CT-NG, RFX HPV ASCU
CHLAMYDIA, NUC. ACID AMP: NEGATIVE
Gonococcus by Nucleic Acid Amp: NEGATIVE
PAP Smear Comment: 0

## 2018-10-14 ENCOUNTER — Ambulatory Visit (HOSPITAL_COMMUNITY)
Admission: EM | Admit: 2018-10-14 | Discharge: 2018-10-14 | Disposition: A | Discharge status: Home / Self Care | Payer: BLUE CROSS/BLUE SHIELD | Attending: Internal Medicine | Admitting: Internal Medicine

## 2018-10-14 ENCOUNTER — Encounter (HOSPITAL_COMMUNITY): Payer: Self-pay | Admitting: Emergency Medicine

## 2018-10-14 DIAGNOSIS — J111 Influenza due to unidentified influenza virus with other respiratory manifestations: Secondary | ICD-10-CM

## 2018-10-14 DIAGNOSIS — R69 Illness, unspecified: Secondary | ICD-10-CM | POA: Diagnosis not present

## 2018-10-14 MED ORDER — IBUPROFEN 800 MG PO TABS
800.0000 mg | ORAL_TABLET | Freq: Once | ORAL | Status: AC
  Administered 2018-10-14: 800 mg via ORAL

## 2018-10-14 MED ORDER — ACETAMINOPHEN 325 MG PO TABS
650.0000 mg | ORAL_TABLET | Freq: Once | ORAL | Status: AC
  Administered 2018-10-14: 650 mg via ORAL

## 2018-10-14 MED ORDER — ACETAMINOPHEN 325 MG PO TABS
ORAL_TABLET | ORAL | Status: AC
  Filled 2018-10-14: qty 2

## 2018-10-14 MED ORDER — IBUPROFEN 800 MG PO TABS
ORAL_TABLET | ORAL | Status: AC
  Filled 2018-10-14: qty 1

## 2018-10-14 MED ORDER — HYDROCOD POLST-CPM POLST ER 10-8 MG/5ML PO SUER: 5.0000 mL | Freq: Two times a day (BID) | ORAL | 0 refills | Status: AC

## 2018-10-14 MED ORDER — OSELTAMIVIR PHOSPHATE 75 MG PO CAPS: 75.0000 mg | ORAL_CAPSULE | Freq: Two times a day (BID) | ORAL | 0 refills | Status: DC

## 2018-10-14 NOTE — ED Provider Notes (Signed)
[facility]    CSN: [id number] Arrival date & time: 10/14/18  1205     History   Chief Complaint Chief Complaint  Patient presents with  . Fever    HPI Gina Chung is a 43 y.o. female.   She presents today with onset yesterday of fever, cough, bad headache, prostration.  Not much runny/congested nose, no sore throat.  Not vomiting, no diarrhea.  Her son just finished chemotherapy yesterday for ALL.    HPI  Past Medical History:  Diagnosis Date  . Anemia   . History of UTI   . Language barrier    arabic and english  . Migraine     Patient Active Problem List   Diagnosis Date Noted  . Elevated hemoglobin A1c measurement 09/16/2016  . Language barrier 09/03/2016    Past Surgical History:  Procedure Laterality Date  . COLPOSCOPY    . DILATION AND EVACUATION N/A 09/11/2016   Procedure: DILATATION AND EVACUATION;  Surgeon: Hermina StaggersMichael L Ervin, MD;  Location: [facility];  Service: Gynecology;  Laterality: N/A;    OB History    Gravida  6   Para  4   Term  4   Preterm  0   AB  2   Living  4     SAB  2   TAB  0   Ectopic  0   Multiple  0   Live Births  4            Home Medications    Prior to Admission medications   Medication Sig Start Date End Date Taking? Authorizing Provider  chlorpheniramine-HYDROcodone (TUSSIONEX PENNKINETIC ER) 10-8 MG/5ML SUER Take 5 mLs by mouth 2 (two) times daily for 7 days. 10/14/18 10/21/18  Isa RankinMurray, Vani Gunner Wilson, MD  levonorgestrel-ethinyl estradiol (SEASONALE,INTROVALE,JOLESSA) 0.15-0.03 MG tablet Take 1 tablet by mouth daily. 01/03/18   Trena PlattEnglish, Stephanie D, PA  Multiple Vitamin (MULTI-VITAMIN DAILY PO) Take by mouth.    [provider]  oseltamivir (TAMIFLU) 75 MG capsule Take 1 capsule (75 mg total) by mouth every 12 (twelve) hours. 10/14/18   Isa RankinMurray, Dorothe Elmore Wilson, MD    Family History Family History  Problem Relation Age of Onset  . Rheum arthritis Mother   . Hypertension Mother   .  Hypertension Father   . Diabetes Maternal Grandfather     Social History Social History   Tobacco Use  . Smoking status: Never Smoker  . Smokeless tobacco: Never Used  Substance Use Topics  . Alcohol use: No  . Drug use: No     Allergies   Patient has no known allergies.   Review of Systems Review of Systems  All other systems reviewed and are negative.    Physical Exam Triage Vital Signs ED Triage Vitals  Enc Vitals Group     BP 10/14/18 1325 (!) 141/87     Pulse Rate 10/14/18 1325 (!) 117     Resp 10/14/18 1325 (!) 22     Temp 10/14/18 1325 100.3 F (37.9 C)     Temp Source 10/14/18 1325 Oral     SpO2 10/14/18 1325 100 %     Weight --      Height --      Pain Score 10/14/18 1326 10     Pain Loc --    Updated Vital Signs BP (!) 141/87 (BP Location: Right Arm)   Pulse (!) 117   Temp 100.2 F (37.9 C) (Oral)   Resp (!) 22  SpO2 100%  Physical Exam Vitals signs and nursing note reviewed.  Constitutional:      Comments: Alert, nicely groomed Laying down on exam table, able to sit up for exam Looks ill but not toxic  HENT:     Head: Atraumatic.     Comments: Bilateral TMs mildly dull, no erythema Minimal nasal congestion bilaterally Steer pharynx is moderately injected Eyes:     Comments: Conjugate gaze, no eye redness/drainage  Neck:     Musculoskeletal: Neck supple.  Cardiovascular:     Rate and Rhythm: Normal rate and regular rhythm.  Pulmonary:     Effort: No respiratory distress.     Breath sounds: No wheezing or rales.     Comments: Slightly coarse but symmetric breath sounds throughout Abdominal:     General: There is no distension.  Musculoskeletal: Normal range of motion.     Comments: No leg swelling  Skin:    General: Skin is warm and dry.     Comments: No cyanosis  Neurological:     Mental Status: She is alert and oriented to person, place, and time.      UC Treatments / Results   Procedures Procedures (including critical  care time)  Medications Ordered in UC Medications  acetaminophen (TYLENOL) tablet 650 mg (650 mg Oral Given 10/14/18 1329)  ibuprofen (ADVIL,MOTRIN) tablet 800 mg (800 mg Oral Given 10/14/18 1420)     Final Clinical Impressions(s) / UC Diagnoses   Final diagnoses:  Influenza-like illness     Discharge Instructions     Anticipate gradual improvement in fever/headache/cough over the next several days.  Recheck for persistent (>3 more days) fever >100.5, increasing phlegm production/nasal discharge, or if not starting to improve in a few days.   Prescriptions for oseltamivir (flu medicine) and cough syrup were sent to the pharmacy.  Tylenol or advil otc as needed for pain.  Push fluids and rest.  Cover your coughs and wash hands frequently to decrease risk of spreading infection.    ED Prescriptions    Medication Sig Dispense Auth. Provider   oseltamivir (TAMIFLU) 75 MG capsule Take 1 capsule (75 mg total) by mouth every 12 (twelve) hours. 10 capsule Isa RankinMurray, Quenna Doepke Wilson, MD   chlorpheniramine-HYDROcodone [place](TUSSIONEX PENNKINETIC ER) 10-8 MG/5ML SUER Take 5 mLs by mouth 2 (two) times daily for 7 days. 140 mL Isa RankinMurray, Breane Grunwald Wilson, MD        Isa RankinMurray, Sadaf Przybysz Wilson, MD 10/17/18 1120

## 2018-10-14 NOTE — ED Notes (Signed)
Patient able to ambulate independently  

## 2018-10-14 NOTE — ED Triage Notes (Signed)
Pt presents to [facility] for assessment of cough, fever, runny nose, headache since yesterday.

## 2018-10-14 NOTE — Discharge Instructions (Addendum)
Anticipate gradual improvement in fever/headache/cough over the next several days.  Recheck for persistent (>3 more days) fever >100.5, increasing phlegm production/nasal discharge, or if not starting to improve in a few days.   Prescriptions for oseltamivir (flu medicine) and cough syrup were sent to the pharmacy.  Tylenol or advil otc as needed for pain.  Push fluids and rest.  Cover your coughs and wash hands frequently to decrease risk of spreading infection.

## 2019-07-20 LAB — OB RESULTS CONSOLE HEPATITIS B SURFACE ANTIGEN: Hepatitis B Surface Ag: NEGATIVE

## 2019-07-23 LAB — HIV ANTIBODY (ROUTINE TESTING W REFLEX)

## 2019-07-23 LAB — OB RESULTS CONSOLE ABO/RH: RH Type: POSITIVE

## 2019-07-23 LAB — OB RESULTS CONSOLE HGB/HCT, BLOOD
HCT: 33 (ref 29–41)
Hemoglobin: 11.2

## 2019-07-23 LAB — OB RESULTS CONSOLE RUBELLA ANTIBODY, IGM: Rubella: IMMUNE

## 2019-07-23 LAB — OB RESULTS CONSOLE GC/CHLAMYDIA
Chlamydia: NEGATIVE
Gonorrhea: NEGATIVE

## 2019-07-23 LAB — OB RESULTS CONSOLE ANTIBODY SCREEN: Antibody Screen: NEGATIVE

## 2019-07-23 LAB — OB RESULTS CONSOLE PLATELET COUNT: Platelets: 325

## 2019-09-15 NOTE — L&D Delivery Note (Addendum)
OB/GYN Faculty Practice Delivery Note  Gina Chung is a 44 y.o. [id number] s/p vaginal delivery at [redacted]w[redacted]d. She was admitted for GDMA2/Class B.  ROM: 6h 93m with clear fluid GBS Status: Negative Maximum Maternal Temperature: 98.54F  Labor Progress: . Presented for IOL and was dilated to 3 cm.  Started on pitocin.  Was then [facility].  Progressed to complete without complication.   Delivery Date/Time: 02/16/2020 at 2024 Delivery: Called to room and patient was complete and pushing. Head delivered LOA. Delivered through tight nuchal cord. Shoulder and body delivered in usual fashion. Infant with spontaneous cry, placed on mother's abdomen, dried and stimulated. Cord clamped x 2 after 1-minute delay, and cut by FOB under my direct supervision. Cord blood drawn. Placenta delivered spontaneously with gentle cord traction. Fundus firm with massage and Pitocin. Labia, perineum, vagina, and cervix were inspected, found to be intact.   Placenta: Intact, 3 vessel cord Complications: None Lacerations: None EBL: 100 cc Analgesia: Epidural  Infant: Viable female  APGARs 9/9  per nursing documentation   Gina Genene Churn, MD PGY-2 Resident, Family Medicine 02/16/2020, 8:37 PM   OB FELLOW DELIVERY ATTESTATION  I was gloved and present for the delivery in its entirety, and I agree with the above resident's note. TXA given during delivery for baseline anemia. Minimal blood loss with delivery. Will give IV iron post-partum.   Jerilynn Birkenhead, MD [facility] Family Medicine Fellow, [facility] [employer], [facility]

## 2019-10-31 ENCOUNTER — Encounter: Payer: Self-pay | Admitting: Obstetrics and Gynecology

## 2019-10-31 ENCOUNTER — Other Ambulatory Visit: Payer: Self-pay

## 2019-10-31 ENCOUNTER — Ambulatory Visit (INDEPENDENT_AMBULATORY_CARE_PROVIDER_SITE_OTHER): Payer: Medicaid Other | Admitting: Obstetrics and Gynecology

## 2019-10-31 DIAGNOSIS — Z3481 Encounter for supervision of other normal pregnancy, first trimester: Secondary | ICD-10-CM | POA: Diagnosis not present

## 2019-10-31 DIAGNOSIS — O0990 Supervision of high risk pregnancy, unspecified, unspecified trimester: Secondary | ICD-10-CM | POA: Insufficient documentation

## 2019-10-31 DIAGNOSIS — Z3A23 23 weeks gestation of pregnancy: Secondary | ICD-10-CM

## 2019-10-31 DIAGNOSIS — O99212 Obesity complicating pregnancy, second trimester: Secondary | ICD-10-CM

## 2019-10-31 DIAGNOSIS — O99210 Obesity complicating pregnancy, unspecified trimester: Secondary | ICD-10-CM | POA: Insufficient documentation

## 2019-10-31 DIAGNOSIS — Z3480 Encounter for supervision of other normal pregnancy, unspecified trimester: Secondary | ICD-10-CM

## 2019-10-31 DIAGNOSIS — O24419 Gestational diabetes mellitus in pregnancy, unspecified control: Secondary | ICD-10-CM | POA: Insufficient documentation

## 2019-10-31 MED ORDER — VITAFOL ULTRA 29-0.6-0.4-200 MG PO CAPS: 1.0000 | ORAL_CAPSULE | Freq: Every day | ORAL | 12 refills | Status: DC

## 2019-10-31 MED ORDER — BLOOD PRESSURE KIT DEVI: 1.0000 | 0 refills | Status: DC

## 2019-10-31 MED ORDER — ASPIRIN EC 81 MG PO TBEC: 81.0000 mg | DELAYED_RELEASE_TABLET | Freq: Every day | ORAL | 2 refills | Status: DC

## 2019-10-31 NOTE — Progress Notes (Signed)
Pt states she was told she has a low lying placenta, states noted on 20 week u/s.

## 2019-10-31 NOTE — Progress Notes (Addendum)
   Subjective:    Gina Chung is a [id number] [redacted]w[redacted]d being seen today for her first obstetrical visit.  Patient is transferring care from [facility] in [place]. Her obstetrical history is significant for advanced maternal age and GDM A2/B, maternal obesity. Patient does intend to breast feed. Pregnancy history fully reviewed. Patient started on Levamir 25 mg qHS on 2/12 and reports fasting as follows 87, 78, 89. All postprandial are reported as being less than 122.   Patient reports no complaints.  Vitals:   10/31/19 1316  BP: 135/77  Pulse: (!) 103  Weight: 194 lb (88 kg)    HISTORY: OB History  Gravida Para Term Preterm AB Living  7 4 4  0 2 4  SAB TAB Ectopic Multiple Live Births  2 0 0 0 4    # Outcome Date GA Lbr Len/2nd Weight Sex Delivery Anes PTL Lv  7 Current           6 SAB 09/10/16 [redacted]w[redacted]d    SAB     5 Term 06/08/13 [redacted]w[redacted]d 19:28 / 00:20 7 lb 13.2 oz (3.549 kg) M Vag-Spont EPI  LIV  4 Term 12/31/11 [redacted]w[redacted]d 05:30 / 00:07 7 lb 15.3 oz (3.61 kg) F Vag-Spont EPI  LIV     Birth Comments: wnl  3 SAB 2010          2 Term 2008 [redacted]w[redacted]d 02:00 7 lb 1 oz (3.204 kg) F Vag-Spont EPI  LIV  1 Term 2006 [redacted]w[redacted]d 12:00 7 lb 6 oz (3.345 kg) M Vag-Spont EPI  LIV   Past Medical History:  Diagnosis Date  . Anemia   . Diabetes mellitus without complication (HCC)   . History of UTI   . Language barrier    arabic and english  . Migraine    Past Surgical History:  Procedure Laterality Date  . COLPOSCOPY    . DILATION AND EVACUATION N/A 09/11/2016   Procedure: DILATATION AND EVACUATION;  Surgeon: 09/13/2016, MD;  Location: [facility];  Service: Gynecology;  Laterality: N/A;   Family History  Problem Relation Age of Onset  . Rheum arthritis Mother   . Hypertension Mother   . Hypertension Father   . Diabetes Maternal Grandfather      Exam    Uterus:  Fundal Height: 24 cm      Assessment:    Pregnancy: [name] Patient Active Problem List   Diagnosis Date Noted  . Supervision of  other normal pregnancy, antepartum 10/31/2019  . Maternal obesity affecting pregnancy, antepartum 10/31/2019  . Gestational diabetes mellitus (GDM) affecting pregnancy, antepartum 10/31/2019  . Elevated hemoglobin A1c measurement 09/16/2016  . Language barrier 09/03/2016        Plan:     Initial labs reviewed. Patient with normal fetal echo on 10/13/19 Problem list reviewed and updated. Growth ultrasound discussed: ordered. Baseline labs collected Rx ASA provided Continue Levamir 25 qHS  Follow up in 3 weeks. 50% of 30 min visit spent on counseling and coordination of care.     Tenia Goh 10/31/2019

## 2019-10-31 NOTE — Patient Instructions (Signed)
 Second Trimester of Pregnancy The second trimester is from week 14 through week 27 (months 4 through 6). The second trimester is often a time when you feel your best. Your body has adjusted to being pregnant, and you begin to feel better physically. Usually, morning sickness has lessened or quit completely, you may have more energy, and you may have an increase in appetite. The second trimester is also a time when the fetus is growing rapidly. At the end of the sixth month, the fetus is about 9 inches long and weighs about 1 pounds. You will likely begin to feel the baby move (quickening) between 16 and 20 weeks of pregnancy. Body changes during your second trimester Your body continues to go through many changes during your second trimester. The changes vary from woman to woman.  Your weight will continue to increase. You will notice your lower abdomen bulging out.  You may begin to get stretch marks on your hips, abdomen, and breasts.  You may develop headaches that can be relieved by medicines. The medicines should be approved by your health care provider.  You may urinate more often because the fetus is pressing on your bladder.  You may develop or continue to have heartburn as a result of your pregnancy.  You may develop constipation because certain hormones are causing the muscles that push waste through your intestines to slow down.  You may develop hemorrhoids or swollen, bulging veins (varicose veins).  You may have back pain. This is caused by: ? Weight gain. ? Pregnancy hormones that are relaxing the joints in your pelvis. ? A shift in weight and the muscles that support your balance.  Your breasts will continue to grow and they will continue to become tender.  Your gums may bleed and may be sensitive to brushing and flossing.  Dark spots or blotches (chloasma, mask of pregnancy) may develop on your face. This will likely fade after the baby is born.  A dark line from  your belly button to the pubic area (linea nigra) may appear. This will likely fade after the baby is born.  You may have changes in your hair. These can include thickening of your hair, rapid growth, and changes in texture. Some women also have hair loss during or after pregnancy, or hair that feels dry or thin. Your hair will most likely return to normal after your baby is born. What to expect at prenatal visits During a routine prenatal visit:  You will be weighed to make sure you and the fetus are growing normally.  Your blood pressure will be taken.  Your abdomen will be measured to track your baby's growth.  The fetal heartbeat will be listened to.  Any test results from the previous visit will be discussed. Your health care provider may ask you:  How you are feeling.  If you are feeling the baby move.  If you have had any abnormal symptoms, such as leaking fluid, bleeding, severe headaches, or abdominal cramping.  If you are using any tobacco products, including cigarettes, chewing tobacco, and electronic cigarettes.  If you have any questions. Other tests that may be performed during your second trimester include:  Blood tests that check for: ? Low iron levels (anemia). ? High blood sugar that affects pregnant women (gestational diabetes) between 24 and 28 weeks. ? Rh antibodies. This is to check for a protein on red blood cells (Rh factor).  Urine tests to check for infections, diabetes, or protein in   the urine.  An ultrasound to confirm the proper growth and development of the baby.  An amniocentesis to check for possible genetic problems.  Fetal screens for spina bifida and Down syndrome.  HIV (human immunodeficiency virus) testing. Routine prenatal testing includes screening for HIV, unless you choose not to have this test. Follow these instructions at home: Medicines  Follow your health care provider's instructions regarding medicine use. Specific medicines  may be either safe or unsafe to take during pregnancy.  Take a prenatal vitamin that contains at least 600 micrograms (mcg) of folic acid.  If you develop constipation, try taking a stool softener if your health care provider approves. Eating and drinking   Eat a balanced diet that includes fresh fruits and vegetables, whole grains, good sources of protein such as meat, eggs, or tofu, and low-fat dairy. Your health care provider will help you determine the amount of weight gain that is right for you.  Avoid raw meat and uncooked cheese. These carry germs that can cause birth defects in the baby.  If you have low calcium intake from food, talk to your health care provider about whether you should take a daily calcium supplement.  Limit foods that are high in fat and processed sugars, such as fried and sweet foods.  To prevent constipation: ? Drink enough fluid to keep your urine clear or pale yellow. ? Eat foods that are high in fiber, such as fresh fruits and vegetables, whole grains, and beans. Activity  Exercise only as directed by your health care provider. Most women can continue their usual exercise routine during pregnancy. Try to exercise for 30 minutes at least 5 days a week. Stop exercising if you experience uterine contractions.  Avoid heavy lifting, wear low heel shoes, and practice good posture.  A sexual relationship may be continued unless your health care provider directs you otherwise. Relieving pain and discomfort  Wear a good support bra to prevent discomfort from breast tenderness.  Take warm sitz baths to soothe any pain or discomfort caused by hemorrhoids. Use hemorrhoid cream if your health care provider approves.  Rest with your legs elevated if you have leg cramps or low back pain.  If you develop varicose veins, wear support hose. Elevate your feet for 15 minutes, 3-4 times a day. Limit salt in your diet. Prenatal Care  Write down your questions. Take  them to your prenatal visits.  Keep all your prenatal visits as told by your health care provider. This is important. Safety  Wear your seat belt at all times when driving.  Make a list of emergency phone numbers, including numbers for family, friends, the hospital, and police and fire departments. General instructions  Ask your health care provider for a referral to a local prenatal education class. Begin classes no later than the beginning of month 6 of your pregnancy.  Ask for help if you have counseling or nutritional needs during pregnancy. Your health care provider can offer advice or refer you to specialists for help with various needs.  Do not use hot tubs, steam rooms, or saunas.  Do not douche or use tampons or scented sanitary pads.  Do not cross your legs for long periods of time.  Avoid cat litter boxes and soil used by cats. These carry germs that can cause birth defects in the baby and possibly loss of the fetus by miscarriage or stillbirth.  Avoid all smoking, herbs, alcohol, and unprescribed drugs. Chemicals in these products can affect the   formation and growth of the baby.  Do not use any products that contain nicotine or tobacco, such as cigarettes and e-cigarettes. If you need help quitting, ask your health care provider.  Visit your dentist if you have not gone yet during your pregnancy. Use a soft toothbrush to brush your teeth and be gentle when you floss. Contact a health care provider if:  You have dizziness.  You have mild pelvic cramps, pelvic pressure, or nagging pain in the abdominal area.  You have persistent nausea, vomiting, or diarrhea.  You have a bad smelling vaginal discharge.  You have pain when you urinate. Get help right away if:  You have a fever.  You are leaking fluid from your vagina.  You have spotting or bleeding from your vagina.  You have severe abdominal cramping or pain.  You have rapid weight gain or weight loss.  You  have shortness of breath with chest pain.  You notice sudden or extreme swelling of your face, hands, ankles, feet, or legs.  You have not felt your baby move in over an hour.  You have severe headaches that do not go away when you take medicine.  You have vision changes. Summary  The second trimester is from week 14 through week 27 (months 4 through 6). It is also a time when the fetus is growing rapidly.  Your body goes through many changes during pregnancy. The changes vary from woman to woman.  Avoid all smoking, herbs, alcohol, and unprescribed drugs. These chemicals affect the formation and growth your baby.  Do not use any tobacco products, such as cigarettes, chewing tobacco, and e-cigarettes. If you need help quitting, ask your health care provider.  Contact your health care provider if you have any questions. Keep all prenatal visits as told by your health care provider. This is important. This information is not intended to replace advice given to you by your health care provider. Make sure you discuss any questions you have with your health care provider. Document Revised: 12/23/2018 Document Reviewed: 10/06/2016 [employer] Patient Education  2020 [employer]   Third Trimester of Pregnancy The third trimester is from week 28 through week 40 (months 7 through 9). The third trimester is a time when the unborn baby (fetus) is growing rapidly. At the end of the ninth month, the fetus is about 20 inches in length and weighs 6-10 pounds. Body changes during your third trimester Your body will continue to go through many changes during pregnancy. The changes vary from woman to woman. During the third trimester:  Your weight will continue to increase. You can expect to gain 25-35 pounds (11-16 kg) by the end of the pregnancy.  You may begin to get stretch marks on your hips, abdomen, and breasts.  You may urinate more often because the fetus is moving lower into your pelvis and  pressing on your bladder.  You may develop or continue to have heartburn. This is caused by increased hormones that slow down muscles in the digestive tract.  You may develop or continue to have constipation because increased hormones slow digestion and cause the muscles that push waste through your intestines to relax.  You may develop hemorrhoids. These are swollen veins (varicose veins) in the rectum that can itch or be painful.  You may develop swollen, bulging veins (varicose veins) in your legs.  You may have increased body aches in the pelvis, back, or thighs. This is due to weight gain and increased hormones   that are relaxing your joints.  You may have changes in your hair. These can include thickening of your hair, rapid growth, and changes in texture. Some women also have hair loss during or after pregnancy, or hair that feels dry or thin. Your hair will most likely return to normal after your baby is born.  Your breasts will continue to grow and they will continue to become tender. A yellow fluid (colostrum) may leak from your breasts. This is the first milk you are producing for your baby.  Your belly button may stick out.  You may notice more swelling in your hands, face, or ankles.  You may have increased tingling or numbness in your hands, arms, and legs. The skin on your belly may also feel numb.  You may feel short of breath because of your expanding uterus.  You may have more problems sleeping. This can be caused by the size of your belly, increased need to urinate, and an increase in your body's metabolism.  You may notice the fetus "dropping," or moving lower in your abdomen (lightening).  You may have increased vaginal discharge.  You may notice your joints feel loose and you may have pain around your pelvic bone. What to expect at prenatal visits You will have prenatal exams every 2 weeks until week 36. Then you will have weekly prenatal exams. During a routine  prenatal visit:  You will be weighed to make sure you and the baby are growing normally.  Your blood pressure will be taken.  Your abdomen will be measured to track your baby's growth.  The fetal heartbeat will be listened to.  Any test results from the previous visit will be discussed.  You may have a cervical check near your due date to see if your cervix has softened or thinned (effaced).  You will be tested for Group B streptococcus. This happens between 35 and 37 weeks. Your health care provider may ask you:  What your birth plan is.  How you are feeling.  If you are feeling the baby move.  If you have had any abnormal symptoms, such as leaking fluid, bleeding, severe headaches, or abdominal cramping.  If you are using any tobacco products, including cigarettes, chewing tobacco, and electronic cigarettes.  If you have any questions. Other tests or screenings that may be performed during your third trimester include:  Blood tests that check for low iron levels (anemia).  Fetal testing to check the health, activity level, and growth of the fetus. Testing is done if you have certain medical conditions or if there are problems during the pregnancy.  Nonstress test (NST). This test checks the health of your baby to make sure there are no signs of problems, such as the baby not getting enough oxygen. During this test, a belt is placed around your belly. The baby is made to move, and its heart rate is monitored during movement. What is false labor? False labor is a condition in which you feel small, irregular tightenings of the muscles in the womb (contractions) that usually go away with rest, changing position, or drinking water. These are called Braxton Hicks contractions. Contractions may last for hours, days, or even weeks before true labor sets in. If contractions come at regular intervals, become more frequent, increase in intensity, or become painful, you should see your  health care provider. What are the signs of labor?  Abdominal cramps.  Regular contractions that start at 10 minutes apart and become stronger and   more frequent with time.  Contractions that start on the top of the uterus and spread down to the lower abdomen and back.  Increased pelvic pressure and dull back pain.  A watery or bloody mucus discharge that comes from the vagina.  Leaking of amniotic fluid. This is also known as your "water breaking." It could be a slow trickle or a gush. Let your health care provider know if it has a color or strange odor. If you have any of these signs, call your health care provider right away, even if it is before your due date. Follow these instructions at home: Medicines  Follow your health care provider's instructions regarding medicine use. Specific medicines may be either safe or unsafe to take during pregnancy.  Take a prenatal vitamin that contains at least 600 micrograms (mcg) of folic acid.  If you develop constipation, try taking a stool softener if your health care provider approves. Eating and drinking   Eat a balanced diet that includes fresh fruits and vegetables, whole grains, good sources of protein such as meat, eggs, or tofu, and low-fat dairy. Your health care provider will help you determine the amount of weight gain that is right for you.  Avoid raw meat and uncooked cheese. These carry germs that can cause birth defects in the baby.  If you have low calcium intake from food, talk to your health care provider about whether you should take a daily calcium supplement.  Eat four or five small meals rather than three large meals a day.  Limit foods that are high in fat and processed sugars, such as fried and sweet foods.  To prevent constipation: ? Drink enough fluid to keep your urine clear or pale yellow. ? Eat foods that are high in fiber, such as fresh fruits and vegetables, whole grains, and beans. Activity  Exercise  only as directed by your health care provider. Most women can continue their usual exercise routine during pregnancy. Try to exercise for 30 minutes at least 5 days a week. Stop exercising if you experience uterine contractions.  Avoid heavy lifting.  Do not exercise in extreme heat or humidity, or at high altitudes.  Wear low-heel, comfortable shoes.  Practice good posture.  You may continue to have sex unless your health care provider tells you otherwise. Relieving pain and discomfort  Take frequent breaks and rest with your legs elevated if you have leg cramps or low back pain.  Take warm sitz baths to soothe any pain or discomfort caused by hemorrhoids. Use hemorrhoid cream if your health care provider approves.  Wear a good support bra to prevent discomfort from breast tenderness.  If you develop varicose veins: ? Wear support pantyhose or compression stockings as told by your healthcare provider. ? Elevate your feet for 15 minutes, 3-4 times a day. Prenatal care  Write down your questions. Take them to your prenatal visits.  Keep all your prenatal visits as told by your health care provider. This is important. Safety  Wear your seat belt at all times when driving.  Make a list of emergency phone numbers, including numbers for family, friends, the hospital, and police and fire departments. General instructions  Avoid cat litter boxes and soil used by cats. These carry germs that can cause birth defects in the baby. If you have a cat, ask someone to clean the litter box for you.  Do not travel far distances unless it is absolutely necessary and only with the approval of your   health care provider.  Do not use hot tubs, steam rooms, or saunas.  Do not drink alcohol.  Do not use any products that contain nicotine or tobacco, such as cigarettes and e-cigarettes. If you need help quitting, ask your health care provider.  Do not use any medicinal herbs or unprescribed drugs.  These chemicals affect the formation and growth of the baby.  Do not douche or use tampons or scented sanitary pads.  Do not cross your legs for long periods of time.  To prepare for the arrival of your baby: ? Take prenatal classes to understand, practice, and ask questions about labor and delivery. ? Make a trial run to the hospital. ? Visit the hospital and tour the maternity area. ? Arrange for maternity or paternity leave through employers. ? Arrange for family and friends to take care of pets while you are in the hospital. ? Purchase a rear-facing car seat and make sure you know how to install it in your car. ? Pack your hospital bag. ? Prepare the baby's nursery. Make sure to remove all pillows and stuffed animals from the baby's crib to prevent suffocation.  Visit your dentist if you have not gone during your pregnancy. Use a soft toothbrush to brush your teeth and be gentle when you floss. Contact a health care provider if:  You are unsure if you are in labor or if your water has broken.  You become dizzy.  You have mild pelvic cramps, pelvic pressure, or nagging pain in your abdominal area.  You have lower back pain.  You have persistent nausea, vomiting, or diarrhea.  You have an unusual or bad smelling vaginal discharge.  You have pain when you urinate. Get help right away if:  Your water breaks before 37 weeks.  You have regular contractions less than 5 minutes apart before 37 weeks.  You have a fever.  You are leaking fluid from your vagina.  You have spotting or bleeding from your vagina.  You have severe abdominal pain or cramping.  You have rapid weight loss or weight gain.  You have shortness of breath with chest pain.  You notice sudden or extreme swelling of your face, hands, ankles, feet, or legs.  Your baby makes fewer than 10 movements in 2 hours.  You have severe headaches that do not go away when you take medicine.  You have vision  changes. Summary  The third trimester is from week 28 through week 40, months 7 through 9. The third trimester is a time when the unborn baby (fetus) is growing rapidly.  During the third trimester, your discomfort may increase as you and your baby continue to gain weight. You may have abdominal, leg, and back pain, sleeping problems, and an increased need to urinate.  During the third trimester your breasts will keep growing and they will continue to become tender. A yellow fluid (colostrum) may leak from your breasts. This is the first milk you are producing for your baby.  False labor is a condition in which you feel small, irregular tightenings of the muscles in the womb (contractions) that eventually go away. These are called Braxton Hicks contractions. Contractions may last for hours, days, or even weeks before true labor sets in.  Signs of labor can include: abdominal cramps; regular contractions that start at 10 minutes apart and become stronger and more frequent with time; watery or bloody mucus discharge that comes from the vagina; increased pelvic pressure and dull back pain; and   leaking of amniotic fluid. This information is not intended to replace advice given to you by your health care provider. Make sure you discuss any questions you have with your health care provider. Document Revised: 12/22/2018 Document Reviewed: 10/06/2016 [employer] Patient Education  2020 [employer]   Contraception Choices Contraception, also called birth control, refers to methods or devices that prevent pregnancy. Hormonal methods Contraceptive implant  A contraceptive implant is a thin, plastic tube that contains a hormone. It is inserted into the upper part of the arm. It can remain in place for up to 3 years. Progestin-only injections Progestin-only injections are injections of progestin, a synthetic form of the hormone progesterone. They are given every 3 months by a health care provider. Birth  control pills  Birth control pills are pills that contain hormones that prevent pregnancy. They must be taken once a day, preferably at the same time each day. Birth control patch  The birth control patch contains hormones that prevent pregnancy. It is placed on the skin and must be changed once a week for three weeks and removed on the fourth week. A prescription is needed to use this method of contraception. Vaginal ring  A vaginal ring contains hormones that prevent pregnancy. It is placed in the vagina for three weeks and removed on the fourth week. After that, the process is repeated with a new ring. A prescription is needed to use this method of contraception. Emergency contraceptive Emergency contraceptives prevent pregnancy after unprotected sex. They come in pill form and can be taken up to 5 days after sex. They work best the sooner they are taken after having sex. Most emergency contraceptives are available without a prescription. This method should not be used as your only form of birth control. Barrier methods Female condom  A female condom is a thin sheath that is worn over the penis during sex. Condoms keep sperm from going inside a woman's body. They can be used with a spermicide to increase their effectiveness. They should be disposed after a single use. Female condom  A female condom is a soft, loose-fitting sheath that is put into the vagina before sex. The condom keeps sperm from going inside a woman's body. They should be disposed after a single use. Diaphragm  A diaphragm is a soft, dome-shaped barrier. It is inserted into the vagina before sex, along with a spermicide. The diaphragm blocks sperm from entering the uterus, and the spermicide kills sperm. A diaphragm should be left in the vagina for 6-8 hours after sex and removed within 24 hours. A diaphragm is prescribed and fitted by a health care provider. A diaphragm should be replaced every 1-2 years, after giving birth,  after gaining more than 15 lb (6.8 kg), and after pelvic surgery. Cervical cap  A cervical cap is a round, soft latex or plastic cup that fits over the cervix. It is inserted into the vagina before sex, along with spermicide. It blocks sperm from entering the uterus. The cap should be left in place for 6-8 hours after sex and removed within 48 hours. A cervical cap must be prescribed and fitted by a health care provider. It should be replaced every 2 years. Sponge  A sponge is a soft, circular piece of polyurethane foam with spermicide on it. The sponge helps block sperm from entering the uterus, and the spermicide kills sperm. To use it, you make it wet and then insert it into the vagina. It should be inserted before sex, left   in for at least 6 hours after sex, and removed and thrown away within 30 hours. Spermicides Spermicides are chemicals that kill or block sperm from entering the cervix and uterus. They can come as a cream, jelly, suppository, foam, or tablet. A spermicide should be inserted into the vagina with an applicator at least 10-15 minutes before sex to allow time for it to work. The process must be repeated every time you have sex. Spermicides do not require a prescription. Intrauterine contraception Intrauterine device (IUD) An IUD is a T-shaped device that is put in a woman's uterus. There are two types:  Hormone IUD.This type contains progestin, a synthetic form of the hormone progesterone. This type can stay in place for 3-5 years.  Copper IUD.This type is wrapped in copper wire. It can stay in place for 10 years.  Permanent methods of contraception Female tubal ligation In this method, a woman's fallopian tubes are sealed, tied, or blocked during surgery to prevent eggs from traveling to the uterus. Hysteroscopic sterilization In this method, a small, flexible insert is placed into each fallopian tube. The inserts cause scar tissue to form in the fallopian tubes and block  them, so sperm cannot reach an egg. The procedure takes about 3 months to be effective. Another form of birth control must be used during those 3 months. Female sterilization This is a procedure to tie off the tubes that carry sperm (vasectomy). After the procedure, the man can still ejaculate fluid (semen). Natural planning methods Natural family planning In this method, a couple does not have sex on days when the woman could become pregnant. Calendar method This means keeping track of the length of each menstrual cycle, identifying the days when pregnancy can happen, and not having sex on those days. Ovulation method In this method, a couple avoids sex during ovulation. Symptothermal method This method involves not having sex during ovulation. The woman typically checks for ovulation by watching changes in her temperature and in the consistency of cervical mucus. Post-ovulation method In this method, a couple waits to have sex until after ovulation. Summary  Contraception, also called birth control, means methods or devices that prevent pregnancy.  Hormonal methods of contraception include implants, injections, pills, patches, vaginal rings, and emergency contraceptives.  Barrier methods of contraception can include female condoms, female condoms, diaphragms, cervical caps, sponges, and spermicides.  There are two types of IUDs (intrauterine devices). An IUD can be put in a woman's uterus to prevent pregnancy for 3-5 years.  Permanent sterilization can be done through a procedure for males, females, or both.  Natural family planning methods involve not having sex on days when the woman could become pregnant. This information is not intended to replace advice given to you by your health care provider. Make sure you discuss any questions you have with your health care provider. Document Revised: 09/02/2017 Document Reviewed: 10/03/2016 Elsevier Patient Education  2020 Elsevier  Inc.   Breastfeeding  Choosing to breastfeed is one of the best decisions you can make for yourself and your baby. A change in hormones during pregnancy causes your breasts to make breast milk in your milk-producing glands. Hormones prevent breast milk from being released before your baby is born. They also prompt milk flow after birth. Once breastfeeding has begun, thoughts of your baby, as well as his or her sucking or crying, can stimulate the release of milk from your milk-producing glands. Benefits of breastfeeding Research shows that breastfeeding offers many health benefits for infants   and mothers. It also offers a cost-free and convenient way to feed your baby. For your baby  Your first milk (colostrum) helps your baby's digestive system to function better.  Special cells in your milk (antibodies) help your baby to fight off infections.  Breastfed babies are less likely to develop asthma, allergies, obesity, or type 2 diabetes. They are also at lower risk for sudden infant death syndrome (SIDS).  Nutrients in breast milk are better able to meet your baby's needs compared to infant formula.  Breast milk improves your baby's brain development. For you  Breastfeeding helps to create a very special bond between you and your baby.  Breastfeeding is convenient. Breast milk costs nothing and is always available at the correct temperature.  Breastfeeding helps to burn calories. It helps you to lose the weight that you gained during pregnancy.  Breastfeeding makes your uterus return faster to its size before pregnancy. It also slows bleeding (lochia) after you give birth.  Breastfeeding helps to lower your risk of developing type 2 diabetes, osteoporosis, rheumatoid arthritis, cardiovascular disease, and breast, ovarian, uterine, and endometrial cancer later in life. Breastfeeding basics Starting breastfeeding  Find a comfortable place to sit or lie down, with your neck and back  well-supported.  Place a pillow or a rolled-up blanket under your baby to bring him or her to the level of your breast (if you are seated). Nursing pillows are specially designed to help support your arms and your baby while you breastfeed.  Make sure that your baby's tummy (abdomen) is facing your abdomen.  Gently massage your breast. With your fingertips, massage from the outer edges of your breast inward toward the nipple. This encourages milk flow. If your milk flows slowly, you may need to continue this action during the feeding.  Support your breast with 4 fingers underneath and your thumb above your nipple (make the letter "C" with your hand). Make sure your fingers are well away from your nipple and your baby's mouth.  Stroke your baby's lips gently with your finger or nipple.  When your baby's mouth is open wide enough, quickly bring your baby to your breast, placing your entire nipple and as much of the areola as possible into your baby's mouth. The areola is the colored area around your nipple. ? More areola should be visible above your baby's upper lip than below the lower lip. ? Your baby's lips should be opened and extended outward (flanged) to ensure an adequate, comfortable latch. ? Your baby's tongue should be between his or her lower gum and your breast.  Make sure that your baby's mouth is correctly positioned around your nipple (latched). Your baby's lips should create a seal on your breast and be turned out (everted).  It is common for your baby to suck about 2-3 minutes in order to start the flow of breast milk. Latching Teaching your baby how to latch onto your breast properly is very important. An improper latch can cause nipple pain, decreased milk supply, and poor weight gain in your baby. Also, if your baby is not latched onto your nipple properly, he or she may swallow some air during feeding. This can make your baby fussy. Burping your baby when you switch breasts  during the feeding can help to get rid of the air. However, teaching your baby to latch on properly is still the best way to prevent fussiness from swallowing air while breastfeeding. Signs that your baby has successfully latched onto your nipple    Silent tugging or silent sucking, without causing you pain. Infant's lips should be extended outward (flanged).  Swallowing heard between every 3-4 sucks once your milk has started to flow (after your let-down milk reflex occurs).  Muscle movement above and in front of his or her ears while sucking. Signs that your baby has not successfully latched onto your nipple  Sucking sounds or smacking sounds from your baby while breastfeeding.  Nipple pain. If you think your baby has not latched on correctly, slip your finger into the corner of your baby's mouth to break the suction and place it between your baby's gums. Attempt to start breastfeeding again. Signs of successful breastfeeding Signs from your baby  Your baby will gradually decrease the number of sucks or will completely stop sucking.  Your baby will fall asleep.  Your baby's body will relax.  Your baby will retain a small amount of milk in his or her mouth.  Your baby will let go of your breast by himself or herself. Signs from you  Breasts that have increased in firmness, weight, and size 1-3 hours after feeding.  Breasts that are softer immediately after breastfeeding.  Increased milk volume, as well as a change in milk consistency and color by the fifth day of breastfeeding.  Nipples that are not sore, cracked, or bleeding. Signs that your baby is getting enough milk  Wetting at least 1-2 diapers during the first 24 hours after birth.  Wetting at least 5-6 diapers every 24 hours for the first week after birth. The urine should be clear or pale yellow by the age of 5 days.  Wetting 6-8 diapers every 24 hours as your baby continues to grow and develop.  At least 3 stools in  a 24-hour period by the age of 5 days. The stool should be soft and yellow.  At least 3 stools in a 24-hour period by the age of 7 days. The stool should be seedy and yellow.  No loss of weight greater than 10% of birth weight during the first 3 days of life.  Average weight gain of 4-7 oz (113-198 g) per week after the age of 4 days.  Consistent daily weight gain by the age of 5 days, without weight loss after the age of 2 weeks. After a feeding, your baby may spit up a small amount of milk. This is normal. Breastfeeding frequency and duration Frequent feeding will help you make more milk and can prevent sore nipples and extremely full breasts (breast engorgement). Breastfeed when you feel the need to reduce the fullness of your breasts or when your baby shows signs of hunger. This is called "breastfeeding on demand." Signs that your baby is hungry include:  Increased alertness, activity, or restlessness.  Movement of the head from side to side.  Opening of the mouth when the corner of the mouth or cheek is stroked (rooting).  Increased sucking sounds, smacking lips, cooing, sighing, or squeaking.  Hand-to-mouth movements and sucking on fingers or hands.  Fussing or crying. Avoid introducing a pacifier to your baby in the first 4-6 weeks after your baby is born. After this time, you may choose to use a pacifier. Research has shown that pacifier use during the first year of a baby's life decreases the risk of sudden infant death syndrome (SIDS). Allow your baby to feed on each breast as long as he or she wants. When your baby unlatches or falls asleep while feeding from the first breast, offer the   second breast. Because newborns are often sleepy in the first few weeks of life, you may need to awaken your baby to get him or her to feed. Breastfeeding times will vary from baby to baby. However, the following rules can serve as a guide to help you make sure that your baby is properly  fed:  Newborns (babies 4 weeks of age or younger) may breastfeed every 1-3 hours.  Newborns should not go without breastfeeding for longer than 3 hours during the day or 5 hours during the night.  You should breastfeed your baby a minimum of 8 times in a 24-hour period. Breast milk pumping     Pumping and storing breast milk allows you to make sure that your baby is exclusively fed your breast milk, even at times when you are unable to breastfeed. This is especially important if you go back to work while you are still breastfeeding, or if you are not able to be present during feedings. Your lactation consultant can help you find a method of pumping that works best for you and give you guidelines about how long it is safe to store breast milk. Caring for your breasts while you breastfeed Nipples can become dry, cracked, and sore while breastfeeding. The following recommendations can help keep your breasts moisturized and healthy:  Avoid using soap on your nipples.  Wear a supportive bra designed especially for nursing. Avoid wearing underwire-style bras or extremely tight bras (sports bras).  Air-dry your nipples for 3-4 minutes after each feeding.  Use only cotton bra pads to absorb leaked breast milk. Leaking of breast milk between feedings is normal.  Use lanolin on your nipples after breastfeeding. Lanolin helps to maintain your skin's normal moisture barrier. Pure lanolin is not harmful (not toxic) to your baby. You may also hand express a few drops of breast milk and gently massage that milk into your nipples and allow the milk to air-dry. In the first few weeks after giving birth, some women experience breast engorgement. Engorgement can make your breasts feel heavy, warm, and tender to the touch. Engorgement peaks within 3-5 days after you give birth. The following recommendations can help to ease engorgement:  Completely empty your breasts while breastfeeding or pumping. You may  want to start by applying warm, moist heat (in the shower or with warm, water-soaked hand towels) just before feeding or pumping. This increases circulation and helps the milk flow. If your baby does not completely empty your breasts while breastfeeding, pump any extra milk after he or she is finished.  Apply ice packs to your breasts immediately after breastfeeding or pumping, unless this is too uncomfortable for you. To do this: ? Put ice in a plastic bag. ? Place a towel between your skin and the bag. ? Leave the ice on for 20 minutes, 2-3 times a day.  Make sure that your baby is latched on and positioned properly while breastfeeding. If engorgement persists after 48 hours of following these recommendations, contact your health care provider or a lactation consultant. Overall health care recommendations while breastfeeding  Eat 3 healthy meals and 3 snacks every day. Well-nourished mothers who are breastfeeding need an additional 450-500 calories a day. You can meet this requirement by increasing the amount of a balanced diet that you eat.  Drink enough water to keep your urine pale yellow or clear.  Rest often, relax, and continue to take your prenatal vitamins to prevent fatigue, stress, and low vitamin and mineral levels in   your body (nutrient deficiencies).  Do not use any products that contain nicotine or tobacco, such as cigarettes and e-cigarettes. Your baby may be harmed by chemicals from cigarettes that pass into breast milk and exposure to secondhand smoke. If you need help quitting, ask your health care provider.  Avoid alcohol.  Do not use illegal drugs or marijuana.  Talk with your health care provider before taking any medicines. These include over-the-counter and prescription medicines as well as vitamins and herbal supplements. Some medicines that may be harmful to your baby can pass through breast milk.  It is possible to become pregnant while breastfeeding. If birth  control is desired, ask your health care provider about options that will be safe while breastfeeding your baby. Where to find more information: [employer]: www.llli.org Contact a health care provider if:  You feel like you want to stop breastfeeding or have become frustrated with breastfeeding.  Your nipples are cracked or bleeding.  Your breasts are red, tender, or warm.  You have: ? Painful breasts or nipples. ? A swollen area on either breast. ? A fever or chills. ? Nausea or vomiting. ? Drainage other than breast milk from your nipples.  Your breasts do not become full before feedings by the fifth day after you give birth.  You feel sad and depressed.  Your baby is: ? Too sleepy to eat well. ? Having trouble sleeping. ? More than 1 week old and wetting fewer than 6 diapers in a 24-hour period. ? Not gaining weight by 5 days of age.  Your baby has fewer than 3 stools in a 24-hour period.  Your baby's skin or the white parts of his or her eyes become yellow. Get help right away if:  Your baby is overly tired (lethargic) and does not want to wake up and feed.  Your baby develops an unexplained fever. Summary  Breastfeeding offers many health benefits for infant and mothers.  Try to breastfeed your infant when he or she shows early signs of hunger.  Gently tickle or stroke your baby's lips with your finger or nipple to allow the baby to open his or her mouth. Bring the baby to your breast. Make sure that much of the areola is in your baby's mouth. Offer one side and burp the baby before you offer the other side.  Talk with your health care provider or lactation consultant if you have questions or you face problems as you breastfeed. This information is not intended to replace advice given to you by your health care provider. Make sure you discuss any questions you have with your health care provider. Document Revised: 11/25/2017 Document Reviewed:  10/02/2016 Elsevier Patient Education  2020 Elsevier Inc.  

## 2019-10-31 NOTE — Addendum Note (Signed)
Addended by: Catalina Antigua on: 10/31/2019 01:41 PM   Modules accepted: Orders

## 2019-11-01 LAB — COMPREHENSIVE METABOLIC PANEL
ALT: 10 IU/L (ref 0–32)
AST: 10 IU/L (ref 0–40)
Albumin/Globulin Ratio: 1.1 — ABNORMAL LOW (ref 1.2–2.2)
Albumin: 3.4 g/dL — ABNORMAL LOW (ref 3.8–4.8)
Alkaline Phosphatase: 97 IU/L (ref 39–117)
BUN/Creatinine Ratio: 12 (ref 9–23)
BUN: 6 mg/dL (ref 6–24)
Bilirubin Total: <0.2 mg/dL (ref 0.0–1.2)
CO2: 17 mmol/L — ABNORMAL LOW (ref 20–29)
Calcium: 9.1 mg/dL (ref 8.7–10.2)
Chloride: 107 mmol/L — ABNORMAL HIGH (ref 96–106)
Creatinine, Ser: 0.5 mg/dL — ABNORMAL LOW (ref 0.57–1.00)
GFR calc Af Amer: 139 mL/min/{1.73_m2} (ref >59)
GFR calc non Af Amer: 121 mL/min/{1.73_m2} (ref >59)
Globulin, Total: 3 g/dL (ref 1.5–4.5)
Glucose: 123 mg/dL — ABNORMAL HIGH (ref 65–99)
Potassium: 3.9 mmol/L (ref 3.5–5.2)
Sodium: 137 mmol/L (ref 134–144)
Total Protein: 6.4 g/dL (ref 6.0–8.5)

## 2019-11-01 LAB — CBC
Hematocrit: 28.7 % — ABNORMAL LOW (ref 34.0–46.6)
Hemoglobin: 9.8 g/dL — ABNORMAL LOW (ref 11.1–15.9)
MCH: 29.9 pg (ref 26.6–33.0)
MCHC: 34.1 g/dL (ref 31.5–35.7)
MCV: 88 fL (ref 79–97)
Platelets: 294 10*3/uL (ref 150–450)
RBC: 3.28 x10E6/uL — ABNORMAL LOW (ref 3.77–5.28)
RDW: 13.3 % (ref 11.7–15.4)
WBC: 7.6 10*3/uL (ref 3.4–10.8)

## 2019-11-01 LAB — RPR: RPR Ser Ql: NONREACTIVE

## 2019-11-01 LAB — TSH: TSH: 2.72 u[IU]/mL (ref 0.450–4.500)

## 2019-11-01 LAB — HIV ANTIBODY (ROUTINE TESTING W REFLEX): HIV Screen 4th Generation wRfx: NONREACTIVE

## 2019-11-02 LAB — PROTEIN / CREATININE RATIO, URINE
Creatinine, Urine: 108.3 mg/dL
Protein, Ur: 7.4 mg/dL
Protein/Creat Ratio: 68 mg/g creat (ref 0–200)

## 2019-11-08 ENCOUNTER — Other Ambulatory Visit: Payer: Self-pay | Admitting: Obstetrics and Gynecology

## 2019-11-08 ENCOUNTER — Ambulatory Visit (HOSPITAL_COMMUNITY)
Admission: RE | Admit: 2019-11-08 | Discharge: 2019-11-08 | Disposition: A | Discharge status: Home / Self Care | Payer: 59 | Source: Ambulatory Visit | Attending: Obstetrics and Gynecology | Admitting: Obstetrics and Gynecology

## 2019-11-08 ENCOUNTER — Other Ambulatory Visit: Payer: Self-pay

## 2019-11-08 DIAGNOSIS — Z3A24 24 weeks gestation of pregnancy: Secondary | ICD-10-CM | POA: Diagnosis not present

## 2019-11-08 DIAGNOSIS — O24414 Gestational diabetes mellitus in pregnancy, insulin controlled: Secondary | ICD-10-CM

## 2019-11-08 DIAGNOSIS — O09522 Supervision of elderly multigravida, second trimester: Secondary | ICD-10-CM

## 2019-11-08 DIAGNOSIS — Z3480 Encounter for supervision of other normal pregnancy, unspecified trimester: Secondary | ICD-10-CM | POA: Insufficient documentation

## 2019-11-08 DIAGNOSIS — O9213 Cracked nipple associated with lactation: Secondary | ICD-10-CM

## 2019-11-09 ENCOUNTER — Other Ambulatory Visit (HOSPITAL_COMMUNITY): Payer: Self-pay | Admitting: *Deleted

## 2019-11-09 DIAGNOSIS — O24414 Gestational diabetes mellitus in pregnancy, insulin controlled: Secondary | ICD-10-CM

## 2019-11-11 ENCOUNTER — Encounter (HOSPITAL_COMMUNITY): Payer: Self-pay

## 2019-11-11 ENCOUNTER — Other Ambulatory Visit: Payer: Self-pay

## 2019-11-11 ENCOUNTER — Inpatient Hospital Stay (EMERGENCY_DEPARTMENT_HOSPITAL)
Admission: AD | Admit: 2019-11-11 | Discharge: 2019-11-11 | Disposition: A | Discharge status: Home / Self Care | Payer: 59 | Source: Home / Self Care | Attending: Obstetrics & Gynecology | Admitting: Obstetrics & Gynecology

## 2019-11-11 ENCOUNTER — Emergency Department (HOSPITAL_COMMUNITY)
Admission: EM | Admit: 2019-11-11 | Discharge: 2019-11-11 | Disposition: A | Discharge status: Home / Self Care | Payer: 59 | Attending: Emergency Medicine | Admitting: Emergency Medicine

## 2019-11-11 DIAGNOSIS — O99212 Obesity complicating pregnancy, second trimester: Secondary | ICD-10-CM

## 2019-11-11 DIAGNOSIS — Z3A25 25 weeks gestation of pregnancy: Secondary | ICD-10-CM

## 2019-11-11 DIAGNOSIS — O24419 Gestational diabetes mellitus in pregnancy, unspecified control: Secondary | ICD-10-CM

## 2019-11-11 DIAGNOSIS — K0889 Other specified disorders of teeth and supporting structures: Secondary | ICD-10-CM | POA: Insufficient documentation

## 2019-11-11 DIAGNOSIS — E119 Type 2 diabetes mellitus without complications: Secondary | ICD-10-CM | POA: Insufficient documentation

## 2019-11-11 DIAGNOSIS — Z79899 Other long term (current) drug therapy: Secondary | ICD-10-CM | POA: Diagnosis not present

## 2019-11-11 DIAGNOSIS — O99210 Obesity complicating pregnancy, unspecified trimester: Secondary | ICD-10-CM

## 2019-11-11 DIAGNOSIS — Z7982 Long term (current) use of aspirin: Secondary | ICD-10-CM | POA: Insufficient documentation

## 2019-11-11 DIAGNOSIS — Z3480 Encounter for supervision of other normal pregnancy, unspecified trimester: Secondary | ICD-10-CM

## 2019-11-11 DIAGNOSIS — O26893 Other specified pregnancy related conditions, third trimester: Secondary | ICD-10-CM | POA: Insufficient documentation

## 2019-11-11 MED ORDER — HYDROCODONE-ACETAMINOPHEN 5-325 MG PO TABS: 1.0000 | ORAL_TABLET | ORAL | 0 refills | Status: DC | PRN

## 2019-11-11 NOTE — ED Triage Notes (Signed)
Pt reports increased pain and swelling on the right side of her face since root canal and drainage of abscess last week. Pt taking amoxicillin. Swelling noted, pt a.o

## 2019-11-11 NOTE — ED Provider Notes (Signed)
[facility] DEPARTMENT Provider Note   CSN: [id number] Arrival date & time: 11/11/19  1745     History Chief Complaint  Patient presents with  . Dental Pain  . Facial Swelling    Gina Chung is a 44 y.o. female with no relevant past medical history presents to the ED with complaints of right-sided maxillary swelling and discomfort.  Patient reportedly had a root canal performed yesterday by her dentist at [facility].  She had been prescribed antibiotics and given a few Vicodin for her pain symptoms.  However, she felt as though her swelling and pain had increased today which prompted her come to the ED for evaluation.  She also endorses mild discomfort with eating, however can swallow without difficulty.  She denies any fevers or chills, chest pain or palpitations, wheezing, visual changes, headache or dizziness, drooling, voice change, or any other symptoms.  Patient is pregnant.  HPI     Past Medical History:  Diagnosis Date  . Anemia   . Diabetes mellitus without complication ([name])   . History of UTI   . Language barrier    arabic and english  . Migraine     Patient Active Problem List   Diagnosis Date Noted  . Supervision of other normal pregnancy, antepartum 10/31/2019  . Maternal obesity affecting pregnancy, antepartum 10/31/2019  . Gestational diabetes mellitus (GDM) affecting pregnancy, antepartum 10/31/2019  . Elevated hemoglobin A1c measurement 09/16/2016  . Language barrier 09/03/2016    Past Surgical History:  Procedure Laterality Date  . COLPOSCOPY    . DILATION AND EVACUATION N/A 09/11/2016   Procedure: DILATATION AND EVACUATION;  Surgeon: Chancy Milroy, MD;  Location: [facility];  Service: Gynecology;  Laterality: N/A;     OB History    Gravida  7   Para  4   Term  4   Preterm  0   AB  2   Living  4     SAB  2   TAB  0   Ectopic  0   Multiple  0   Live Births  4           Family  History  Problem Relation Age of Onset  . Rheum arthritis Mother   . Hypertension Mother   . Hypertension Father   . Diabetes Maternal Grandfather     Social History   Tobacco Use  . Smoking status: Never Smoker  . Smokeless tobacco: Never Used  Substance Use Topics  . Alcohol use: No  . Drug use: No    Home Medications Prior to Admission medications   Medication Sig Start Date End Date Taking? Authorizing Provider  aspirin EC 81 MG tablet Take 1 tablet (81 mg total) by mouth daily. Take after 12 weeks for prevention of preeclampsia later in pregnancy 10/31/19   Constant, Peggy, MD  Blood Pressure Monitoring (BLOOD PRESSURE KIT) DEVI 1 kit by Does not apply route once a week. Check Blood Pressure regularly and record readings into the Bab[employer] App.  Large Cuff.  DX O90.0 10/31/19   Constant, Peggy, MD  HYDROcodone-acetaminophen (NORCO/VICODIN) 5-325 MG tablet Take 1 tablet by mouth every 4 (four) hours as needed for severe pain. 11/11/19   Corena Herter, PA-C  Insulin Detemir (LEVEMIR) 100 UNIT/ML Pen Inject 25 units each night at bedtime 10/27/19   [provider]  Multiple Vitamin (MULTI-VITAMIN DAILY PO) Take by mouth.    [provider]  omeprazole ([place])  20 MG capsule Take 20 mg by mouth daily.    [provider]  oseltamivir (TAMIFLU) 75 MG capsule Take 1 capsule (75 mg total) by mouth every 12 (twelve) hours. Patient not taking: Reported on 10/31/2019 10/14/18   [name], MD  Prenat-Fe Poly-Methfol-FA-DHA (VITAFOL ULTRA) 29-0.6-0.4-200 MG CAPS Take 1 tablet by mouth daily. 10/31/19   Constant, Peggy, MD    Allergies    Patient has no known allergies.  Review of Systems   Review of Systems  Constitutional: Negative for chills and fever.  HENT: Positive for dental problem. Negative for drooling, sore throat, trouble swallowing and voice change.   Respiratory: Negative for wheezing.     Physical Exam Updated Vital Signs BP (!)  137/93 (BP Location: Right Arm)   Pulse (!) 112   Temp 99.7 F (37.6 C) (Oral)   Resp 17   LMP 05/19/2019   SpO2 99%   Physical Exam Vitals and nursing note reviewed. Exam conducted with a chaperone present.  Constitutional:      Appearance: Normal appearance.  HENT:     Head:     Comments: Swelling over right maxillary region.  No periorbital swelling.    Nose: Nose normal.     Mouth/Throat:     Comments: Patent oropharynx.  No uvular deviation or masses appreciated.  TTP over gum and inside of cheek in area of #4 tooth.  No obvious appreciable fluctuance or mass amenable to drainage.  Tolerating secretions well. Eyes:     General: No scleral icterus.    Conjunctiva/sclera: Conjunctivae normal.  Neck:     Comments: No significant swelling. Cardiovascular:     Rate and Rhythm: Normal rate and regular rhythm.  Pulmonary:     Effort: Pulmonary effort is normal.  Musculoskeletal:     Cervical back: Normal range of motion and neck supple. No rigidity.  Skin:    General: Skin is dry.  Neurological:     Mental Status: She is alert.     GCS: GCS eye subscore is 4. GCS verbal subscore is 5. GCS motor subscore is 6.  Psychiatric:        Mood and Affect: Mood normal.        Behavior: Behavior normal.        Thought Content: Thought content normal.     ED Results / Procedures / Treatments   Labs (all labs ordered are listed, but only abnormal results are displayed) Labs Reviewed - No data to display  EKG None  Radiology No results found.  Procedures Procedures (including critical care time)  Medications Ordered in ED Medications - No data to display  ED Course  I have reviewed the triage vital signs and the nursing notes.  Pertinent labs & imaging results that were available during my care of the patient were reviewed by me and considered in my medical decision making (see chart for details).    MDM Rules/Calculators/A&P                      While in the ED,  she was evaluated with a dentist who agrees to see her tomorrow morning at 9 AM for an I&D procedure, if necessary.  He has also called her in a different antibiotic that she may begin taking.  However, he is requesting that he provide her with analgesia.  She has been prescribed Tylenol-3 which she was taking with good effect.  She is all out and is requesting  additional anesthesia.  Patient is pregnant.  Will prescribe additional Vicodin for pain control.  Return provided here in the ED given the fact that she drove here herself.  Return to the ED or seek immediate medical attention should you develop any drooling, difficulty breathing, difficulty swallowing, uncontrolled fevers and chills, voice change, inability to open your mouth, or any other new or worsening symptoms.  Final Clinical Impression(s) / ED Diagnoses Final diagnoses:  Pain, dental    Rx / DC Orders ED Discharge Orders         Ordered    HYDROcodone-acetaminophen (NORCO/VICODIN) 5-325 MG tablet  Every 4 hours PRN     11/11/19 1950           Corena Herter, PA-C 11/11/19 1950    Tegeler, Gwenyth Allegra, MD 11/12/19 [phone]

## 2019-11-11 NOTE — Discharge Instructions (Addendum)
Please go to your dentist appointment at 9 AM, as scheduled.  Continue take your antibiotics, as prescribed.  I have prescribed you additional Vicodin.  Please take only if pain uncontrolled by Tylenol.  Return to the ED or seek immediate medical attention should you develop any drooling, difficulty breathing, difficulty swallowing, uncontrolled fevers and chills, voice change, inability to open your mouth, or any other new or worsening symptoms.

## 2019-11-11 NOTE — MAU Note (Signed)
Gina Chung is a 44 y.o. at [redacted]w[redacted]d here in MAU reporting: having swelling in face, cheeks, and throat. Had dental procedure on Friday morning, had an abcess around one of her molar teeth. No abdominal pain, VB, LOF, or abnormal discharge.   Onset of complaint: Friday  Pain score: 3/10  Vitals:   11/11/19 1653  BP: 139/83  Pulse: (!) 119  Resp: 16  Temp: 99 F (37.2 C)  SpO2: 100%     FHT:145  Lab orders placed from triage: none

## 2019-11-11 NOTE — MAU Note (Signed)
Report given to [facility] in [place].   Transport notified, pt to wait in lobby until they are able to come.

## 2019-11-11 NOTE — ED Notes (Signed)
Assumed care of pt. EDP at bedside. Pt resting on cart in NAD. Breathing easyy, non-labored. Call light within reach. Will continue to monitor

## 2019-11-11 NOTE — MAU Provider Note (Signed)
First Provider Initiated Contact with Patient 11/11/19 1705      S Ms. Gina Chung is a 44 y.o. [phone] non-pregnant female who presents to MAU today with complaint of dental pain. Pt reports she had a procedure done yesterday to remove an abscess at the root of a tooth on the right side of her mouth and she has been given Tylenol 3 and Amoxicillin which she is taking as prescribed, but patient reports the pain is worsening and the swelling is spreading across the right side of her face extending up the right side of her nose and around her right eye.  O BP 139/83 (BP Location: Right Arm)   Pulse (!) 119   Temp 99 F (37.2 C) (Oral)   Resp 16   Ht 5\' 3"  (1.6 m)   Wt 87.9 kg   LMP 05/19/2019   SpO2 100% Comment: room air  BMI 34.33 kg/m   Patient Vitals for the past 24 hrs:  BP Temp Temp src Pulse Resp SpO2 Height Weight  11/11/19 1653 139/83 99 F (37.2 C) Oral (!) 119 16 100 % -- --  11/11/19 1647 -- -- -- -- -- -- 5\' 3"  (1.6 m) 87.9 kg   Physical Exam  Constitutional: She is oriented to person, place, and time. She appears well-developed and well-nourished. No distress.  HENT:  Head: Normocephalic and atraumatic.  Respiratory: Effort normal.  Neurological: She is alert and oriented to person, place, and time.  Skin: She is not diaphoretic.  Psychiatric: She has a normal mood and affect. Her behavior is normal. Judgment and thought content normal.   A Pregnant female @[redacted]w[redacted]d  FHT 145 Medical screening exam complete  P Discharge from MAU in stable condition Spoke with ED Provider Dr. 11/13/19 who accepts transfer. Pt to call on-call provider at the dental office for further instruction. Patient transferred to [facility] for further evaluation Warning signs for worsening condition that would warrant emergency follow-up discussed Patient may return to MAU as needed for pregnancy related complaints  Evangelyne Loja, , NP 11/11/2019 5:18 PM

## 2019-11-11 NOTE — ED Notes (Signed)
Patient verbalizes understanding of discharge instructions and prescription medications. Opportunity for questioning and answers were provided. All questions answered completely. Armband removed by staff, pt discharged from ED. Ambulatory from ED with strong, steady gait 

## 2019-11-21 ENCOUNTER — Telehealth (INDEPENDENT_AMBULATORY_CARE_PROVIDER_SITE_OTHER): Payer: 59 | Admitting: Obstetrics & Gynecology

## 2019-11-21 ENCOUNTER — Encounter: Payer: Self-pay | Admitting: Obstetrics & Gynecology

## 2019-11-21 VITALS — BP 122/75

## 2019-11-21 DIAGNOSIS — O99012 Anemia complicating pregnancy, second trimester: Secondary | ICD-10-CM

## 2019-11-21 DIAGNOSIS — O0992 Supervision of high risk pregnancy, unspecified, second trimester: Secondary | ICD-10-CM

## 2019-11-21 DIAGNOSIS — O99013 Anemia complicating pregnancy, third trimester: Secondary | ICD-10-CM | POA: Insufficient documentation

## 2019-11-21 DIAGNOSIS — O09522 Supervision of elderly multigravida, second trimester: Secondary | ICD-10-CM

## 2019-11-21 DIAGNOSIS — O24419 Gestational diabetes mellitus in pregnancy, unspecified control: Secondary | ICD-10-CM

## 2019-11-21 MED ORDER — POLYSACCHARIDE IRON COMPLEX 150 MG PO CAPS: 150.0000 mg | ORAL_CAPSULE | Freq: Every day | ORAL | 2 refills | Status: DC

## 2019-11-21 NOTE — Patient Instructions (Signed)
Return to office for any scheduled appointments. Call the office or go to the MAU at [facility] if:  You begin to have strong, frequent contractions  Your water breaks.  Sometimes it is a big gush of fluid, sometimes it is just a trickle that keeps getting your panties wet or running down your legs  You have vaginal bleeding.  It is normal to have a small amount of spotting if your cervix was checked.   You do not feel your baby moving like normal.  If you do not, get something to eat and drink and lay down and focus on feeling your baby move.   If your baby is still not moving like normal, you should call the office or go to MAU.  Any other obstetric concerns.   

## 2019-11-21 NOTE — Progress Notes (Signed)
    TELEHEALTH VIRTUAL OBSTETRICS VISIT ENCOUNTER NOTE  I connected with Gina Chung on 11/21/19 at  1:00 PM EST by telephone at home and verified that I am speaking with the correct person using two identifiers.   I discussed the limitations, risks, security and privacy concerns of performing an evaluation and management service by telephone and the availability of in person appointments. I also discussed with the patient that there may be a patient responsible charge related to this service. The patient expressed understanding and agreed to proceed. Unable to do audiovisual encounter due to technical difficulties.   Subjective:  Gina Chung is a 44 y.o. [id number] at [redacted]w[redacted]d being followed for ongoing prenatal care.  She is currently monitored for the following issues for this high-risk pregnancy and has AMA (advanced maternal age) multigravida 35+; Language barrier; Elevated hemoglobin A1c measurement; Supervision of high-risk pregnancy; Maternal obesity affecting pregnancy, antepartum; Gestational diabetes mellitus (GDM) affecting pregnancy, antepartum; and Anemia affecting pregnancy in third trimester on their problem list.  Patient reports no complaints. Reports fetal movement. Denies any contractions, bleeding or leaking of fluid.   The following portions of the patient's history were reviewed and updated as appropriate: allergies, current medications, past family history, past medical history, past social history, past surgical history and problem list.   Objective:   General:  Alert, oriented and cooperative.   Mental Status: Normal mood and affect perceived. Normal judgment and thought content.  Rest of physical exam deferred due to type of encounter  Assessment and Plan:  Pregnancy: [id number] at [redacted]w[redacted]d 1. Gestational diabetes mellitus (GDM) affecting pregnancy, antepartum CBGs reviewed over the phone and within normal range. Continue Levemir. Continue serial scans and antenatal testing  as per MFM recommendations.  2. Multigravida of advanced maternal age in second trimester Antenatal testing in third trimester.  3. Anemia affecting pregnancy in second trimester CBC Latest Ref Rng & Units 10/31/2019 07/23/2019 01/03/2018  WBC 3.4 - 10.8 x10E3/uL 7.6 - 6.2  Hemoglobin 11.1 - 15.9 g/dL 5.2(D) 78.2 42.3  Hematocrit 34.0 - 46.6 % 28.7(L) 33 37.0  Platelets 150 - 450 x10E3/uL 294 325 379  Iron prescribed. Will recheck in 4-6 weeks. - iron polysaccharides (NIFEREX) 150 MG capsule; Take 1 capsule (150 mg total) by mouth daily.  Dispense: 30 capsule; Refill: 2  4. Supervision of high risk pregnancy in second trimester Preterm labor symptoms and general obstetric precautions including but not limited to vaginal bleeding, contractions, leaking of fluid and fetal movement were reviewed in detail with the patient.  I discussed the assessment and treatment plan with the patient. The patient was provided an opportunity to ask questions and all were answered. The patient agreed with the plan and demonstrated an understanding of the instructions. The patient was advised to call back or seek an in-person office evaluation/go to MAU at [facility] for any urgent or concerning symptoms. Please refer to After Visit Summary for other counseling recommendations.   I provided 10 minutes of non-face-to-face time during this encounter.  Return in about 16 days (around 12/07/2019) for Virtual OB Visit .  Future Appointments  Date Time Provider Department Center  12/07/2019  1:15 PM WH-MFC NURSE WH-MFC MFC-US  12/07/2019  1:15 PM WH-MFC [place] 4 WH-MFCUS MFC-US    Jaynie Collins, MD Center for [facility], [facility]

## 2019-12-07 ENCOUNTER — Other Ambulatory Visit: Payer: Self-pay

## 2019-12-07 ENCOUNTER — Other Ambulatory Visit (HOSPITAL_COMMUNITY): Payer: Self-pay | Admitting: *Deleted

## 2019-12-07 ENCOUNTER — Ambulatory Visit (HOSPITAL_COMMUNITY)
Admission: RE | Admit: 2019-12-07 | Discharge: 2019-12-07 | Disposition: A | Discharge status: Home / Self Care | Payer: 59 | Source: Ambulatory Visit | Attending: Obstetrics and Gynecology | Admitting: Obstetrics and Gynecology

## 2019-12-07 ENCOUNTER — Encounter (HOSPITAL_COMMUNITY): Payer: Self-pay

## 2019-12-07 ENCOUNTER — Ambulatory Visit (HOSPITAL_COMMUNITY): Payer: 59 | Admitting: *Deleted

## 2019-12-07 DIAGNOSIS — O99210 Obesity complicating pregnancy, unspecified trimester: Secondary | ICD-10-CM | POA: Insufficient documentation

## 2019-12-07 DIAGNOSIS — E669 Obesity, unspecified: Secondary | ICD-10-CM

## 2019-12-07 DIAGNOSIS — O24414 Gestational diabetes mellitus in pregnancy, insulin controlled: Secondary | ICD-10-CM | POA: Insufficient documentation

## 2019-12-07 DIAGNOSIS — O24419 Gestational diabetes mellitus in pregnancy, unspecified control: Secondary | ICD-10-CM | POA: Insufficient documentation

## 2019-12-07 DIAGNOSIS — O09529 Supervision of elderly multigravida, unspecified trimester: Secondary | ICD-10-CM

## 2019-12-07 DIAGNOSIS — Z363 Encounter for antenatal screening for malformations: Secondary | ICD-10-CM | POA: Diagnosis not present

## 2019-12-07 DIAGNOSIS — Z3A28 28 weeks gestation of pregnancy: Secondary | ICD-10-CM

## 2019-12-07 DIAGNOSIS — O99213 Obesity complicating pregnancy, third trimester: Secondary | ICD-10-CM

## 2019-12-07 DIAGNOSIS — O09523 Supervision of elderly multigravida, third trimester: Secondary | ICD-10-CM

## 2019-12-11 ENCOUNTER — Telehealth (INDEPENDENT_AMBULATORY_CARE_PROVIDER_SITE_OTHER): Payer: 59 | Admitting: Obstetrics & Gynecology

## 2019-12-11 ENCOUNTER — Encounter: Payer: Self-pay | Admitting: Obstetrics & Gynecology

## 2019-12-11 DIAGNOSIS — O24419 Gestational diabetes mellitus in pregnancy, unspecified control: Secondary | ICD-10-CM

## 2019-12-11 DIAGNOSIS — O99210 Obesity complicating pregnancy, unspecified trimester: Secondary | ICD-10-CM

## 2019-12-11 DIAGNOSIS — Z3A29 29 weeks gestation of pregnancy: Secondary | ICD-10-CM

## 2019-12-11 DIAGNOSIS — O99213 Obesity complicating pregnancy, third trimester: Secondary | ICD-10-CM

## 2019-12-11 DIAGNOSIS — O0993 Supervision of high risk pregnancy, unspecified, third trimester: Secondary | ICD-10-CM

## 2019-12-11 DIAGNOSIS — O0990 Supervision of high risk pregnancy, unspecified, unspecified trimester: Secondary | ICD-10-CM

## 2019-12-11 MED ORDER — INSULIN DETEMIR 100 UNIT/ML FLEXPEN: PEN_INJECTOR | SUBCUTANEOUS | 3 refills | Status: DC

## 2019-12-11 MED ORDER — FLUCONAZOLE 150 MG PO TABS: 150.0000 mg | ORAL_TABLET | Freq: Once | ORAL | 0 refills | Status: AC

## 2019-12-11 NOTE — Progress Notes (Signed)
Pt states she is having vaginal itching x 1 week. Pt tried OTC med but did not resolve problem.

## 2019-12-11 NOTE — Patient Instructions (Signed)

## 2019-12-11 NOTE — Progress Notes (Signed)
   TELEHEALTH VIRTUAL OBSTETRICS VISIT ENCOUNTER NOTE  I connected with Gina Chung on 12/11/19 at  1:45 PM EDT by telephone at home and verified that I am speaking with the correct person using two identifiers.   I discussed the limitations, risks, security and privacy concerns of performing an evaluation and management service by telephone and the availability of in person appointments. I also discussed with the patient that there may be a patient responsible charge related to this service. The patient expressed understanding and agreed to proceed.  Subjective:  Gina Chung is a 44 y.o. [id number] at [redacted]w[redacted]d being followed for ongoing prenatal care.  She is currently monitored for the following issues for this high-risk pregnancy and has AMA (advanced maternal age) multigravida 35+; Language barrier; Elevated hemoglobin A1c measurement; Supervision of high-risk pregnancy; Maternal obesity affecting pregnancy, antepartum; Gestational diabetes mellitus (GDM) affecting pregnancy, antepartum; and Anemia affecting pregnancy in third trimester on their problem list.  Patient reports vaginal irritation and itching external vulva. Reports fetal movement. Denies any contractions, bleeding or leaking of fluid.   The following portions of the patient's history were reviewed and updated as appropriate: allergies, current medications, past family history, past medical history, past social history, past surgical history and problem list.   Objective:   General:  Alert, oriented and cooperative.   Mental Status: Normal mood and affect perceived. Normal judgment and thought content.  Rest of physical exam deferred due to type of encounter  Assessment and Plan:  Pregnancy: [id number] at [redacted]w[redacted]d 1. Supervision of high risk pregnancy, antepartum Sx may be yeast vulvovaginitis. Call for appt if not improved - fluconazole (DIFLUCAN) 150 MG tablet; Take 1 tablet (150 mg total) by mouth once for 1 dose.  Dispense: 1  tablet; Refill: 0  2. Maternal obesity affecting pregnancy, antepartum   3. Gestational diabetes mellitus (GDM) affecting pregnancy, antepartum States FBS <95 and PP <130 - insulin detemir (LEVEMIR) 100 UNIT/ML FlexPen; Inject 25 units each night at bedtime  Dispense: 15 mL; Refill: 3  Preterm labor symptoms and general obstetric precautions including but not limited to vaginal bleeding, contractions, leaking of fluid and fetal movement were reviewed in detail with the patient.  I discussed the assessment and treatment plan with the patient. The patient was provided an opportunity to ask questions and all were answered. The patient agreed with the plan and demonstrated an understanding of the instructions. The patient was advised to call back or seek an in-person office evaluation/go to MAU at [facility] for any urgent or concerning symptoms. Please refer to After Visit Summary for other counseling recommendations.   I provided 11 minutes of non-face-to-face time during this encounter.  Return in about 2 weeks (around 12/25/2019) for virtual.  Future Appointments  Date Time Provider Department Center  01/04/2020 12:30 PM WH-MFC NURSE WH-MFC MFC-US  01/04/2020 12:30 PM WH-MFC [place] 1 WH-MFCUS MFC-US  01/10/2020 12:30 PM WH-MFC NURSE WH-MFC MFC-US  01/10/2020 12:30 PM WH-MFC [place] 1 WH-MFCUS MFC-US    Scheryl Darter, MD Center for [facility], [facility]

## 2019-12-25 ENCOUNTER — Telehealth (INDEPENDENT_AMBULATORY_CARE_PROVIDER_SITE_OTHER): Payer: 59 | Admitting: Obstetrics and Gynecology

## 2019-12-25 ENCOUNTER — Encounter: Payer: Self-pay | Admitting: Obstetrics and Gynecology

## 2019-12-25 VITALS — BP 130/80

## 2019-12-25 DIAGNOSIS — O99213 Obesity complicating pregnancy, third trimester: Secondary | ICD-10-CM

## 2019-12-25 DIAGNOSIS — O09523 Supervision of elderly multigravida, third trimester: Secondary | ICD-10-CM

## 2019-12-25 DIAGNOSIS — O99013 Anemia complicating pregnancy, third trimester: Secondary | ICD-10-CM

## 2019-12-25 DIAGNOSIS — Z3A31 31 weeks gestation of pregnancy: Secondary | ICD-10-CM

## 2019-12-25 DIAGNOSIS — D649 Anemia, unspecified: Secondary | ICD-10-CM

## 2019-12-25 DIAGNOSIS — O99210 Obesity complicating pregnancy, unspecified trimester: Secondary | ICD-10-CM

## 2019-12-25 DIAGNOSIS — O24414 Gestational diabetes mellitus in pregnancy, insulin controlled: Secondary | ICD-10-CM

## 2019-12-25 DIAGNOSIS — E669 Obesity, unspecified: Secondary | ICD-10-CM

## 2019-12-25 DIAGNOSIS — O0993 Supervision of high risk pregnancy, unspecified, third trimester: Secondary | ICD-10-CM

## 2019-12-25 DIAGNOSIS — O24419 Gestational diabetes mellitus in pregnancy, unspecified control: Secondary | ICD-10-CM

## 2019-12-25 NOTE — Progress Notes (Signed)
Virtual ROB   CC: None   B/P taken by pt at home

## 2019-12-25 NOTE — Progress Notes (Signed)
OBSTETRICS PRENATAL VIRTUAL VISIT ENCOUNTER NOTE  Provider location: Center for [facility] at [facility]   I connected with Gina Chung on 12/25/19 at  1:45 PM EDT by MyChart Video Encounter at home and verified that I am speaking with the correct person using two identifiers.   I discussed the limitations, risks, security and privacy concerns of performing an evaluation and management service virtually and the availability of in person appointments. I also discussed with the patient that there may be a patient responsible charge related to this service. The patient expressed understanding and agreed to proceed. Subjective:  Gina Chung is a 44 y.o. [id number] at [redacted]w[redacted]d being seen today for ongoing prenatal care.  She is currently monitored for the following issues for this high-risk pregnancy and has AMA (advanced maternal age) multigravida 35+; Language barrier; Elevated hemoglobin A1c measurement; Supervision of high-risk pregnancy; Maternal obesity affecting pregnancy, antepartum; Gestational diabetes mellitus (GDM) affecting pregnancy, antepartum; and Anemia affecting pregnancy in third trimester on their problem list.  Patient reports no complaints.  Contractions: Irritability. Vag. Bleeding: None.  Movement: Present. Denies any leaking of fluid.   The following portions of the patient's history were reviewed and updated as appropriate: allergies, current medications, past family history, past medical history, past social history, past surgical history and problem list.   Objective:   Vitals:   12/25/19 1335  BP: 130/80    Fetal Status:     Movement: Present     General:  Alert, oriented and cooperative. Patient is in no acute distress.  Respiratory: Normal respiratory effort, no problems with respiration noted  Mental Status: Normal mood and affect. Normal behavior. Normal judgment and thought content.  Rest of physical exam deferred due to type of encounter  Imaging: [place]  MFM OB FOLLOW UP  Result Date: 12/07/2019 ----------------------------------------------------------------------  OBSTETRICS REPORT                       (Signed Final 12/07/2019 02:07 pm) ---------------------------------------------------------------------- Patient Info  ID #:       [id number]                          D.O.B.:  1976-01-06 (43 yrs)  Name:       Gina Chung                   Visit Date: 12/07/2019 01:33 pm ---------------------------------------------------------------------- Performed By  Performed By:     Lenise Arena        Secondary Phy.:   [facility]                    RDMS  Attending:        Ma Rings MD         Address:          [address]  [address]                                                             [place]                                                             [phone]  Referred By:      Gigi GinPEGGY                  [facility]         [facility]                    Damion Kant MD                              Fetal Care  Ref. Address:     Faculty ---------------------------------------------------------------------- Orders   #  Description                          Code         Ordered By   1  US MFM OB FOLLOW UP                  [id number]     Rosana HoesYU FANG  ----------------------------------------------------------------------   #  Order #                    Accession #                 Episode #   1  [id number]                  [id number]  ---------------------------------------------------------------------- Indications   Gestational diabetes in pregnancy, insulin     O24.414   controlled   Advanced maternal age multigravida 4035+,        70O09.523   third trimester (43 yrs)   Encounter for antenatal screening for          Z36.3   malformations   Obesity complicating pregnancy, third          O99.213   trimester   [redacted] weeks  gestation of pregnancy                Z3A.28  ---------------------------------------------------------------------- Vital Signs  Weight (lb): 193                               Height:        5'3"  BMI:         34.18 ---------------------------------------------------------------------- Fetal Evaluation  Num Of Fetuses:         1  Cardiac Activity:       Observed  Presentation:           Cephalic  Placenta:  Posterior  P. Cord Insertion:      Previously Visualized  Amniotic Fluid  AFI FV:      Within normal limits  AFI Sum(cm)     %Tile       Largest Pocket(cm)  18.43           71          6.52  RUQ(cm)       RLQ(cm)       LUQ(cm)        LLQ(cm)  3.82          4.27          6.52           3.82 ---------------------------------------------------------------------- Biometry  BPD:      71.1  mm     G. Age:  28w 4d         28  %    CI:        73.36   %    70 - 86                                                          FL/HC:      21.2   %    19.6 - 20.8  HC:      263.8  mm     G. Age:  28w 5d         15  %    HC/AC:      1.05        0.99 - 1.21  AC:      250.2  mm     G. Age:  29w 2d         55  %    FL/BPD:     78.8   %    71 - 87  FL:         56  mm     G. Age:  29w 3d         53  %    FL/AC:      22.4   %    20 - 24  LV:        5.6  mm  Est. FW:    1353  gm           3 lb     50  % ---------------------------------------------------------------------- OB History  Gravidity:    7         Term:   4        Prem:   0        SAB:   2  TOP:          0       Ectopic:  0        Living: 4 ---------------------------------------------------------------------- Gestational Age  LMP:           28w 6d        Date:  05/19/19                 EDD:   02/23/20  U/S Today:     29w 0d  EDD:   02/22/20  Best:          28w 6d     Det. By:  LMP  (05/19/19)          EDD:   02/23/20 ---------------------------------------------------------------------- Anatomy  Cranium:               Appears  normal         Aortic Arch:            Previously seen  Cavum:                 Appears normal         Ductal Arch:            Appears normal  Ventricles:            Appears normal         Diaphragm:              Appears normal  Choroid Plexus:        Previously seen        Stomach:                Appears normal, left                                                                        sided  Cerebellum:            Previously seen        Abdomen:                Appears normal  Posterior Fossa:       Previously seen        Abdominal Wall:         Appears nml (cord                                                                        insert, abd wall)  Nuchal Fold:           Not applicable (>24    Cord Vessels:           Previously seen                         wks GA)  Face:                  Profile nl; orbits     Kidneys:                Appear normal                         previously seen  Lips:                  Previously seen        Bladder:                Appears normal  Thoracic:  Appears normal         Spine:                  Appears normal  Heart:                 Previously seen        Upper Extremities:      Previously seen  RVOT:                  Appears normal         Lower Extremities:      Previously seen  LVOT:                  Previously seen  Other:  Heels and 5th digit visualized. Technically difficult due to fetal position. ---------------------------------------------------------------------- Cervix Uterus Adnexa  Cervix  Not visualized (advanced GA >24wks) ---------------------------------------------------------------------- Comments  This patient was seen for a follow up growth scan due to  insulin controlled gestational diabetes.  The patient reports  that she already had a normal fetal echocardiogram.  She  denies any problems since her last exam.  She was informed that the fetal growth and amniotic fluid  level appears appropriate for her gestational age.  A follow up exam was  scheduled in 4 weeks.  Due to A2  gestational diabetes, we will start weekly fetal testing at 32  weeks. ----------------------------------------------------------------------                   Ma Rings, MD Electronically Signed Final Report   12/07/2019 02:07 pm ----------------------------------------------------------------------   Assessment and Plan:  Pregnancy: [id number] at [redacted]w[redacted]d 1. Supervision of high risk pregnancy in third trimester Patient is doing well without complaints  2. Gestational diabetes mellitus (GDM) affecting pregnancy, antepartum CBGs reviewed and patient reports highest fasting 96 with most values in the 80's and highest pp value 145 with most values less than 125 Will continue current insulin dosge  Follow up ultrasound 4/22  3. Maternal obesity affecting pregnancy, antepartum Continue ASA  4. Anemia affecting pregnancy in third trimester Continue iron supplement Will check CBC at her next visit  5. Multigravida of advanced maternal age in third trimester   Preterm labor symptoms and general obstetric precautions including but not limited to vaginal bleeding, contractions, leaking of fluid and fetal movement were reviewed in detail with the patient. I discussed the assessment and treatment plan with the patient. The patient was provided an opportunity to ask questions and all were answered. The patient agreed with the plan and demonstrated an understanding of the instructions. The patient was advised to call back or seek an in-person office evaluation/go to MAU at [facility] for any urgent or concerning symptoms. Please refer to After Visit Summary for other counseling recommendations.   I provided 13 minutes of face-to-face time during this encounter.  Return in about 2 weeks (around 01/08/2020) for in person, ROB, High risk.  Future Appointments  Date Time Provider Department Center  01/04/2020 12:30 PM WH-MFC NURSE WH-MFC MFC-US  01/04/2020  12:30 PM WH-MFC [place] 1 WH-MFCUS MFC-US  01/10/2020 12:30 PM WH-MFC NURSE WH-MFC MFC-US  01/10/2020 12:30 PM WH-MFC [place] 1 WH-MFCUS MFC-US    Catalina Antigua, MD Center for [employer], [facility]

## 2020-01-04 ENCOUNTER — Ambulatory Visit (HOSPITAL_COMMUNITY): Payer: 59

## 2020-01-05 ENCOUNTER — Ambulatory Visit (HOSPITAL_COMMUNITY): Payer: 59 | Admitting: *Deleted

## 2020-01-05 ENCOUNTER — Other Ambulatory Visit: Payer: Self-pay

## 2020-01-05 ENCOUNTER — Encounter (HOSPITAL_COMMUNITY): Payer: Self-pay

## 2020-01-05 ENCOUNTER — Ambulatory Visit (HOSPITAL_COMMUNITY)
Admission: RE | Admit: 2020-01-05 | Discharge: 2020-01-05 | Disposition: A | Discharge status: Home / Self Care | Payer: 59 | Source: Ambulatory Visit | Attending: Obstetrics | Admitting: Obstetrics

## 2020-01-05 ENCOUNTER — Other Ambulatory Visit (HOSPITAL_COMMUNITY): Payer: Self-pay | Admitting: *Deleted

## 2020-01-05 ENCOUNTER — Other Ambulatory Visit (HOSPITAL_COMMUNITY): Payer: Self-pay | Admitting: Obstetrics

## 2020-01-05 DIAGNOSIS — O99213 Obesity complicating pregnancy, third trimester: Secondary | ICD-10-CM

## 2020-01-05 DIAGNOSIS — O09523 Supervision of elderly multigravida, third trimester: Secondary | ICD-10-CM | POA: Diagnosis not present

## 2020-01-05 DIAGNOSIS — Z3A33 33 weeks gestation of pregnancy: Secondary | ICD-10-CM

## 2020-01-05 DIAGNOSIS — O09529 Supervision of elderly multigravida, unspecified trimester: Secondary | ICD-10-CM | POA: Insufficient documentation

## 2020-01-05 DIAGNOSIS — O0993 Supervision of high risk pregnancy, unspecified, third trimester: Secondary | ICD-10-CM | POA: Insufficient documentation

## 2020-01-05 DIAGNOSIS — O24419 Gestational diabetes mellitus in pregnancy, unspecified control: Secondary | ICD-10-CM

## 2020-01-05 DIAGNOSIS — E669 Obesity, unspecified: Secondary | ICD-10-CM

## 2020-01-05 DIAGNOSIS — O99210 Obesity complicating pregnancy, unspecified trimester: Secondary | ICD-10-CM

## 2020-01-05 DIAGNOSIS — O24414 Gestational diabetes mellitus in pregnancy, insulin controlled: Secondary | ICD-10-CM | POA: Diagnosis not present

## 2020-01-05 DIAGNOSIS — Z362 Encounter for other antenatal screening follow-up: Secondary | ICD-10-CM

## 2020-01-05 NOTE — Procedures (Signed)
Gina Chung 01/28/1976 [redacted]w[redacted]d  Fetus A Non-Stress Test Interpretation for 01/05/20  Indication: Unsatisfactory BPP  Fetal Heart Rate A Mode: External Baseline Rate (A): 125 bpm Variability: Moderate Accelerations: 15 x 15 Decelerations: None Multiple birth?: No  Uterine Activity Mode: Palpation, Toco Contraction Frequency (min): none Resting Tone Palpated: Relaxed Resting Time: Adequate  Interpretation (Fetal Testing) Nonstress Test Interpretation: Reactive Overall Impression: Reassuring for gestational age Comments: Reviewed tracing with Dr. Parke Poisson

## 2020-01-08 ENCOUNTER — Other Ambulatory Visit: Payer: Self-pay

## 2020-01-08 ENCOUNTER — Ambulatory Visit (INDEPENDENT_AMBULATORY_CARE_PROVIDER_SITE_OTHER): Payer: 59 | Admitting: Obstetrics and Gynecology

## 2020-01-08 ENCOUNTER — Encounter: Payer: Self-pay | Admitting: Obstetrics and Gynecology

## 2020-01-08 VITALS — BP 118/74 | HR 81 | Wt 191.2 lb

## 2020-01-08 DIAGNOSIS — O99013 Anemia complicating pregnancy, third trimester: Secondary | ICD-10-CM

## 2020-01-08 DIAGNOSIS — Z789 Other specified health status: Secondary | ICD-10-CM

## 2020-01-08 DIAGNOSIS — Z3A33 33 weeks gestation of pregnancy: Secondary | ICD-10-CM

## 2020-01-08 DIAGNOSIS — O0993 Supervision of high risk pregnancy, unspecified, third trimester: Secondary | ICD-10-CM

## 2020-01-08 DIAGNOSIS — O99210 Obesity complicating pregnancy, unspecified trimester: Secondary | ICD-10-CM

## 2020-01-08 DIAGNOSIS — O24419 Gestational diabetes mellitus in pregnancy, unspecified control: Secondary | ICD-10-CM

## 2020-01-08 DIAGNOSIS — O09523 Supervision of elderly multigravida, third trimester: Secondary | ICD-10-CM

## 2020-01-08 NOTE — Progress Notes (Signed)
Pt is here for HOB, [redacted]w[redacted]d.

## 2020-01-08 NOTE — Progress Notes (Signed)
   PRENATAL VISIT NOTE  Subjective:  Gina Chung is a 44 y.o. [id number] at [redacted]w[redacted]d being seen today for ongoing prenatal care.  She is currently monitored for the following issues for this high-risk pregnancy and has AMA (advanced maternal age) multigravida 35+; Language barrier; Elevated hemoglobin A1c measurement; Supervision of high-risk pregnancy; Maternal obesity affecting pregnancy, antepartum; Gestational diabetes mellitus (GDM) affecting pregnancy, antepartum; and Anemia affecting pregnancy in third trimester on their problem list.  Patient reports no complaints.  Contractions: Irritability. Vag. Bleeding: None.  Movement: Present. Denies leaking of fluid.   The following portions of the patient's history were reviewed and updated as appropriate: allergies, current medications, past family history, past medical history, past social history, past surgical history and problem list.   Objective:   Vitals:   01/08/20 1434  BP: 118/74  Pulse: 81  Weight: 191 lb 3.2 oz (86.7 kg)    Fetal Status: Fetal Heart Rate (bpm): 145 Fundal Height: 33 cm Movement: Present     General:  Alert, oriented and cooperative. Patient is in no acute distress.  Skin: Skin is warm and dry. No rash noted.   Cardiovascular: Normal heart rate noted  Respiratory: Normal respiratory effort, no problems with respiration noted  Abdomen: Soft, gravid, appropriate for gestational age.  Pain/Pressure: Absent     Pelvic: Cervical exam deferred        Extremities: Normal range of motion.  Edema: None  Mental Status: Normal mood and affect. Normal behavior. Normal judgment and thought content.   Assessment and Plan:  Pregnancy: [id number] at [redacted]w[redacted]d 1. Supervision of high risk pregnancy in third trimester Patient is doing well without complaints  2. Gestational diabetes mellitus (GDM) affecting pregnancy, antepartum Patient did not bring CBG log. She reports highest fasting reading as 92 and highest pp reading as  135 Will continue current regimen.  Advised patient to bring log or meter at next visit Continue weekly antenatal testing  3. Multigravida of advanced maternal age in third trimester Continue ASA  4. Anemia affecting pregnancy in third trimester Cbc today  5. Maternal obesity affecting pregnancy, antepartum   6. Language barrier   Preterm labor symptoms and general obstetric precautions including but not limited to vaginal bleeding, contractions, leaking of fluid and fetal movement were reviewed in detail with the patient. Please refer to After Visit Summary for other counseling recommendations.   No follow-ups on file.  Future Appointments  Date Time Provider Department Center  01/10/2020 12:30 PM [facility] NURSE [facility]  01/10/2020 12:30 PM [facility] [place] [facility]  01/19/2020  3:00 PM [facility] NURSE [facility]  01/19/2020  3:00 PM [facility] [place] 3 [facility]-US  01/24/2020  3:15 PM [facility] NURSE [facility]  01/24/2020  3:15 PM [facility] [place] 2 [facility]  01/31/2020  3:30 PM [facility] NURSE [facility]  01/31/2020  3:30 PM [facility] [place] 5 [facility] M[facility]-US    Catalina Antigua, MD

## 2020-01-09 LAB — CBC
Hematocrit: 28.3 % — ABNORMAL LOW (ref 34.0–46.6)
Hemoglobin: 9.3 g/dL — ABNORMAL LOW (ref 11.1–15.9)
MCH: 28.7 pg (ref 26.6–33.0)
MCHC: 32.9 g/dL (ref 31.5–35.7)
MCV: 87 fL (ref 79–97)
Platelets: 275 10*3/uL (ref 150–450)
RBC: 3.24 x10E6/uL — ABNORMAL LOW (ref 3.77–5.28)
RDW: 13.3 % (ref 11.7–15.4)
WBC: 7 10*3/uL (ref 3.4–10.8)

## 2020-01-09 MED ORDER — SODIUM CHLORIDE 0.9 % IV SOLN: 510.0000 mg | Freq: Once | INTRAVENOUS | Status: DC

## 2020-01-09 NOTE — Addendum Note (Signed)
Addended by: Catalina Antigua on: 01/09/2020 09:05 AM   Modules accepted: Orders, SmartSet

## 2020-01-10 ENCOUNTER — Ambulatory Visit (HOSPITAL_COMMUNITY)
Admission: RE | Admit: 2020-01-10 | Discharge: 2020-01-10 | Disposition: A | Discharge status: Home / Self Care | Payer: 59 | Source: Ambulatory Visit | Attending: Obstetrics and Gynecology | Admitting: Obstetrics and Gynecology

## 2020-01-10 ENCOUNTER — Encounter (HOSPITAL_COMMUNITY): Payer: Self-pay

## 2020-01-10 ENCOUNTER — Other Ambulatory Visit: Payer: Self-pay

## 2020-01-10 ENCOUNTER — Ambulatory Visit (HOSPITAL_COMMUNITY): Payer: 59 | Admitting: *Deleted

## 2020-01-10 DIAGNOSIS — O0993 Supervision of high risk pregnancy, unspecified, third trimester: Secondary | ICD-10-CM | POA: Insufficient documentation

## 2020-01-10 DIAGNOSIS — O09529 Supervision of elderly multigravida, unspecified trimester: Secondary | ICD-10-CM | POA: Insufficient documentation

## 2020-01-10 DIAGNOSIS — O99210 Obesity complicating pregnancy, unspecified trimester: Secondary | ICD-10-CM | POA: Insufficient documentation

## 2020-01-10 DIAGNOSIS — O24419 Gestational diabetes mellitus in pregnancy, unspecified control: Secondary | ICD-10-CM

## 2020-01-10 DIAGNOSIS — Z3A33 33 weeks gestation of pregnancy: Secondary | ICD-10-CM | POA: Diagnosis not present

## 2020-01-10 DIAGNOSIS — Z794 Long term (current) use of insulin: Secondary | ICD-10-CM

## 2020-01-10 DIAGNOSIS — O24414 Gestational diabetes mellitus in pregnancy, insulin controlled: Secondary | ICD-10-CM | POA: Diagnosis not present

## 2020-01-10 DIAGNOSIS — O09523 Supervision of elderly multigravida, third trimester: Secondary | ICD-10-CM | POA: Diagnosis not present

## 2020-01-17 ENCOUNTER — Other Ambulatory Visit: Payer: Self-pay

## 2020-01-17 ENCOUNTER — Ambulatory Visit: Payer: 59 | Admitting: *Deleted

## 2020-01-17 ENCOUNTER — Ambulatory Visit (HOSPITAL_COMMUNITY): Payer: 59 | Attending: Obstetrics

## 2020-01-17 DIAGNOSIS — O99210 Obesity complicating pregnancy, unspecified trimester: Secondary | ICD-10-CM | POA: Insufficient documentation

## 2020-01-17 DIAGNOSIS — O24414 Gestational diabetes mellitus in pregnancy, insulin controlled: Secondary | ICD-10-CM | POA: Insufficient documentation

## 2020-01-17 DIAGNOSIS — Z3A34 34 weeks gestation of pregnancy: Secondary | ICD-10-CM | POA: Diagnosis not present

## 2020-01-17 DIAGNOSIS — Z794 Long term (current) use of insulin: Secondary | ICD-10-CM | POA: Diagnosis not present

## 2020-01-17 DIAGNOSIS — O24419 Gestational diabetes mellitus in pregnancy, unspecified control: Secondary | ICD-10-CM | POA: Diagnosis present

## 2020-01-17 DIAGNOSIS — O09523 Supervision of elderly multigravida, third trimester: Secondary | ICD-10-CM | POA: Diagnosis not present

## 2020-01-19 ENCOUNTER — Ambulatory Visit (HOSPITAL_COMMUNITY): Payer: 59

## 2020-01-22 ENCOUNTER — Other Ambulatory Visit: Payer: Self-pay

## 2020-01-22 ENCOUNTER — Encounter: Payer: Self-pay | Admitting: Obstetrics and Gynecology

## 2020-01-22 ENCOUNTER — Ambulatory Visit (INDEPENDENT_AMBULATORY_CARE_PROVIDER_SITE_OTHER): Payer: 59 | Admitting: Obstetrics and Gynecology

## 2020-01-22 ENCOUNTER — Encounter: Payer: 59 | Admitting: Obstetrics and Gynecology

## 2020-01-22 VITALS — BP 125/81 | HR 102 | Wt 182.0 lb

## 2020-01-22 DIAGNOSIS — Z3A35 35 weeks gestation of pregnancy: Secondary | ICD-10-CM

## 2020-01-22 DIAGNOSIS — O99210 Obesity complicating pregnancy, unspecified trimester: Secondary | ICD-10-CM

## 2020-01-22 DIAGNOSIS — O09523 Supervision of elderly multigravida, third trimester: Secondary | ICD-10-CM

## 2020-01-22 DIAGNOSIS — O0993 Supervision of high risk pregnancy, unspecified, third trimester: Secondary | ICD-10-CM

## 2020-01-22 DIAGNOSIS — O99213 Obesity complicating pregnancy, third trimester: Secondary | ICD-10-CM

## 2020-01-22 DIAGNOSIS — O24419 Gestational diabetes mellitus in pregnancy, unspecified control: Secondary | ICD-10-CM

## 2020-01-22 DIAGNOSIS — O99013 Anemia complicating pregnancy, third trimester: Secondary | ICD-10-CM

## 2020-01-22 NOTE — Progress Notes (Signed)
Pt states she is doing well, sugars have been normal. 

## 2020-01-22 NOTE — Progress Notes (Signed)
   PRENATAL VISIT NOTE  Subjective:  Gina Chung is a 44 y.o. [id number] at [redacted]w[redacted]d being seen today for ongoing prenatal care.  She is currently monitored for the following issues for this high-risk pregnancy and has AMA (advanced maternal age) multigravida 35+; Language barrier; Elevated hemoglobin A1c measurement; Supervision of high-risk pregnancy; Maternal obesity affecting pregnancy, antepartum; Gestational diabetes mellitus (GDM) affecting pregnancy, antepartum; and Anemia affecting pregnancy in third trimester on their problem list.  Patient reports no complaints.  Contractions: Irregular. Vag. Bleeding: None.  Movement: Present. Denies leaking of fluid.   The following portions of the patient's history were reviewed and updated as appropriate: allergies, current medications, past family history, past medical history, past social history, past surgical history and problem list.   Objective:   Vitals:   01/22/20 1615  BP: 125/81  Pulse: (!) 102  Weight: 182 lb (82.6 kg)    Fetal Status: Fetal Heart Rate (bpm): 140 Fundal Height: 36 cm Movement: Present     General:  Alert, oriented and cooperative. Patient is in no acute distress.  Skin: Skin is warm and dry. No rash noted.   Cardiovascular: Normal heart rate noted  Respiratory: Normal respiratory effort, no problems with respiration noted  Abdomen: Soft, gravid, appropriate for gestational age.  Pain/Pressure: Present     Pelvic: Cervical exam deferred        Extremities: Normal range of motion.     Mental Status: Normal mood and affect. Normal behavior. Normal judgment and thought content.   Assessment and Plan:  Pregnancy: [id number] at [redacted]w[redacted]d 1. Supervision of high risk pregnancy in third trimester Patient is doing well without complaints Cultures next visit  2. Gestational diabetes mellitus (GDM) affecting pregnancy, antepartum Patient without readings today and reports fasting less than 90 and pp in the 120's with highest  value of 120 Continue current dosage BPP 8/8 on 5/5 COntinue weekly antenatal testing  3. Maternal obesity affecting pregnancy, antepartum Continue ASA  4. Multigravida of advanced maternal age in third trimester   5. Anemia affecting pregnancy in third trimester Patient to be scheduled for feraheme this week  Preterm labor symptoms and general obstetric precautions including but not limited to vaginal bleeding, contractions, leaking of fluid and fetal movement were reviewed in detail with the patient. Please refer to After Visit Summary for other counseling recommendations.   Return in about 1 week (around 01/29/2020) for in person, ROB, High risk.  Future Appointments  Date Time Provider Department Center  01/24/2020  3:15 PM [facility] NURSE [facility]  01/24/2020  3:15 PM [facility] US2 [facility]  01/31/2020  3:30 PM [facility] NURSE [facility]  01/31/2020  3:30 PM [facility] US5 [facility]    Catalina Antigua, MD

## 2020-01-24 ENCOUNTER — Ambulatory Visit (HOSPITAL_COMMUNITY): Payer: 59 | Attending: Obstetrics and Gynecology

## 2020-01-24 ENCOUNTER — Ambulatory Visit: Payer: 59 | Admitting: *Deleted

## 2020-01-24 ENCOUNTER — Other Ambulatory Visit (HOSPITAL_COMMUNITY): Payer: Self-pay | Admitting: Obstetrics

## 2020-01-24 ENCOUNTER — Other Ambulatory Visit: Payer: Self-pay

## 2020-01-24 DIAGNOSIS — O24414 Gestational diabetes mellitus in pregnancy, insulin controlled: Secondary | ICD-10-CM

## 2020-01-24 DIAGNOSIS — O99213 Obesity complicating pregnancy, third trimester: Secondary | ICD-10-CM

## 2020-01-24 DIAGNOSIS — O24419 Gestational diabetes mellitus in pregnancy, unspecified control: Secondary | ICD-10-CM | POA: Insufficient documentation

## 2020-01-24 DIAGNOSIS — E669 Obesity, unspecified: Secondary | ICD-10-CM | POA: Diagnosis not present

## 2020-01-24 DIAGNOSIS — O0993 Supervision of high risk pregnancy, unspecified, third trimester: Secondary | ICD-10-CM | POA: Diagnosis present

## 2020-01-24 DIAGNOSIS — O09523 Supervision of elderly multigravida, third trimester: Secondary | ICD-10-CM | POA: Diagnosis not present

## 2020-01-24 DIAGNOSIS — O99210 Obesity complicating pregnancy, unspecified trimester: Secondary | ICD-10-CM

## 2020-01-24 DIAGNOSIS — Z3A35 35 weeks gestation of pregnancy: Secondary | ICD-10-CM

## 2020-01-24 NOTE — Procedures (Signed)
Gina Chung 1976-02-02 [redacted]w[redacted]d  Fetus A Non-Stress Test Interpretation for 01/24/20  Indication: Gestational Diabetes medication controlled  Fetal Heart Rate A Mode: External Baseline Rate (A): 130 bpm Variability: Moderate Accelerations: 15 x 15 Decelerations: None Multiple birth?: No  Uterine Activity Mode: Palpation, Toco Contraction Frequency (min): none Resting Tone Palpated: Relaxed Resting Time: Adequate  Interpretation (Fetal Testing) Nonstress Test Interpretation: Reactive Comments: Reviewed tracing with Dr. Judeth Cornfield

## 2020-01-26 ENCOUNTER — Other Ambulatory Visit (HOSPITAL_COMMUNITY): Payer: Self-pay | Admitting: *Deleted

## 2020-01-29 ENCOUNTER — Inpatient Hospital Stay (HOSPITAL_COMMUNITY): Admission: RE | Admit: 2020-01-29 | Payer: 59 | Source: Ambulatory Visit

## 2020-01-31 ENCOUNTER — Other Ambulatory Visit: Payer: Self-pay

## 2020-01-31 ENCOUNTER — Ambulatory Visit (INDEPENDENT_AMBULATORY_CARE_PROVIDER_SITE_OTHER): Payer: 59 | Admitting: Obstetrics and Gynecology

## 2020-01-31 ENCOUNTER — Ambulatory Visit: Payer: 59 | Admitting: *Deleted

## 2020-01-31 ENCOUNTER — Other Ambulatory Visit (HOSPITAL_COMMUNITY)
Admission: RE | Admit: 2020-01-31 | Discharge: 2020-01-31 | Disposition: A | Discharge status: Home / Self Care | Payer: 59 | Source: Ambulatory Visit | Attending: Obstetrics and Gynecology | Admitting: Obstetrics and Gynecology

## 2020-01-31 ENCOUNTER — Ambulatory Visit (HOSPITAL_BASED_OUTPATIENT_CLINIC_OR_DEPARTMENT_OTHER): Payer: 59

## 2020-01-31 ENCOUNTER — Encounter: Payer: Self-pay | Admitting: Obstetrics and Gynecology

## 2020-01-31 VITALS — BP 124/76 | HR 98 | Wt 193.7 lb

## 2020-01-31 DIAGNOSIS — O0993 Supervision of high risk pregnancy, unspecified, third trimester: Secondary | ICD-10-CM

## 2020-01-31 DIAGNOSIS — O99210 Obesity complicating pregnancy, unspecified trimester: Secondary | ICD-10-CM

## 2020-01-31 DIAGNOSIS — O24419 Gestational diabetes mellitus in pregnancy, unspecified control: Secondary | ICD-10-CM

## 2020-01-31 DIAGNOSIS — O09523 Supervision of elderly multigravida, third trimester: Secondary | ICD-10-CM | POA: Diagnosis not present

## 2020-01-31 DIAGNOSIS — Z3A36 36 weeks gestation of pregnancy: Secondary | ICD-10-CM | POA: Diagnosis not present

## 2020-01-31 DIAGNOSIS — Z789 Other specified health status: Secondary | ICD-10-CM

## 2020-01-31 DIAGNOSIS — O24414 Gestational diabetes mellitus in pregnancy, insulin controlled: Secondary | ICD-10-CM | POA: Insufficient documentation

## 2020-01-31 DIAGNOSIS — O99013 Anemia complicating pregnancy, third trimester: Secondary | ICD-10-CM

## 2020-01-31 NOTE — Progress Notes (Signed)
Pt is here for ROB, [redacted]w[redacted]d.  

## 2020-01-31 NOTE — Progress Notes (Signed)
   PRENATAL VISIT NOTE  Subjective:  Gina Chung is a 44 y.o. [id number] at [redacted]w[redacted]d being seen today for ongoing prenatal care.  She is currently monitored for the following issues for this high-risk pregnancy and has AMA (advanced maternal age) multigravida 35+; Language barrier; Elevated hemoglobin A1c measurement; Supervision of high-risk pregnancy; Maternal obesity affecting pregnancy, antepartum; Gestational diabetes mellitus (GDM) affecting pregnancy, antepartum; and Anemia affecting pregnancy in third trimester on their problem list.  Patient reports occasional contractions.  Contractions: Not present. Vag. Bleeding: None.  Movement: Present. Denies leaking of fluid.   The following portions of the patient's history were reviewed and updated as appropriate: allergies, current medications, past family history, past medical history, past social history, past surgical history and problem list.   Objective:   Vitals:   01/31/20 1408  BP: 124/76  Pulse: 98  Weight: 193 lb 11.2 oz (87.9 kg)    Fetal Status: Fetal Heart Rate (bpm): 154   Movement: Present     General:  Alert, oriented and cooperative. Patient is in no acute distress.  Skin: Skin is warm and dry. No rash noted.   Cardiovascular: Normal heart rate noted  Respiratory: Normal respiratory effort, no problems with respiration noted  Abdomen: Soft, gravid, appropriate for gestational age.  Pain/Pressure: Absent     Pelvic: Cervical exam deferred        Extremities: Normal range of motion.  Edema: None  Mental Status: Normal mood and affect. Normal behavior. Normal judgment and thought content.   Assessment and Plan:  Pregnancy: E3O1224 at [redacted]w[redacted]d 1. Supervision of high risk pregnancy in third trimester - Strep Gp B NAA - Cervicovaginal ancillary only( [place])  2. Multigravida of advanced maternal age in third trimester  3. Language barrier  4. Anemia affecting pregnancy in third trimester Missed feraheme,  rescheduled  5. Maternal obesity affecting pregnancy, antepartum  6. Gestational diabetes mellitus (GDM) affecting pregnancy, antepartum On levemir, reports CBGs are within range Will need induction at 39 weeks  Preterm labor symptoms and general obstetric precautions including but not limited to vaginal bleeding, contractions, leaking of fluid and fetal movement were reviewed in detail with the patient. Please refer to After Visit Summary for other counseling recommendations.   Return in about 1 week (around 02/07/2020) for high OB, in person.  Future Appointments  Date Time Provider Department Center  01/31/2020  3:30 PM [facility] NURSE [facility]  01/31/2020  3:30 PM [facility] US5 [facility]    Conan Bowens, MD

## 2020-02-01 LAB — CERVICOVAGINAL ANCILLARY ONLY
Chlamydia: NEGATIVE
Comment: NEGATIVE
Comment: NORMAL
Neisseria Gonorrhea: NEGATIVE

## 2020-02-02 LAB — STREP GP B NAA: Strep Gp B NAA: NEGATIVE

## 2020-02-05 ENCOUNTER — Encounter (HOSPITAL_COMMUNITY): Payer: 59

## 2020-02-05 ENCOUNTER — Other Ambulatory Visit: Payer: Self-pay | Admitting: *Deleted

## 2020-02-05 DIAGNOSIS — O24415 Gestational diabetes mellitus in pregnancy, controlled by oral hypoglycemic drugs: Secondary | ICD-10-CM

## 2020-02-07 ENCOUNTER — Other Ambulatory Visit: Payer: Self-pay

## 2020-02-07 ENCOUNTER — Ambulatory Visit: Payer: 59 | Admitting: *Deleted

## 2020-02-07 ENCOUNTER — Ambulatory Visit (INDEPENDENT_AMBULATORY_CARE_PROVIDER_SITE_OTHER): Payer: 59 | Admitting: Obstetrics & Gynecology

## 2020-02-07 ENCOUNTER — Ambulatory Visit: Payer: 59 | Attending: Maternal & Fetal Medicine

## 2020-02-07 VITALS — BP 119/76 | HR 97 | Wt 192.8 lb

## 2020-02-07 DIAGNOSIS — O0993 Supervision of high risk pregnancy, unspecified, third trimester: Secondary | ICD-10-CM | POA: Diagnosis present

## 2020-02-07 DIAGNOSIS — Z3A37 37 weeks gestation of pregnancy: Secondary | ICD-10-CM | POA: Diagnosis not present

## 2020-02-07 DIAGNOSIS — Z794 Long term (current) use of insulin: Secondary | ICD-10-CM

## 2020-02-07 DIAGNOSIS — O24419 Gestational diabetes mellitus in pregnancy, unspecified control: Secondary | ICD-10-CM

## 2020-02-07 DIAGNOSIS — O09523 Supervision of elderly multigravida, third trimester: Secondary | ICD-10-CM

## 2020-02-07 DIAGNOSIS — O99210 Obesity complicating pregnancy, unspecified trimester: Secondary | ICD-10-CM | POA: Diagnosis present

## 2020-02-07 DIAGNOSIS — O24414 Gestational diabetes mellitus in pregnancy, insulin controlled: Secondary | ICD-10-CM | POA: Diagnosis not present

## 2020-02-07 DIAGNOSIS — O24415 Gestational diabetes mellitus in pregnancy, controlled by oral hypoglycemic drugs: Secondary | ICD-10-CM | POA: Insufficient documentation

## 2020-02-07 DIAGNOSIS — Z789 Other specified health status: Secondary | ICD-10-CM

## 2020-02-07 NOTE — Patient Instructions (Signed)

## 2020-02-07 NOTE — Progress Notes (Signed)
Patient presents for ROB. Patient has no concerns today. 

## 2020-02-07 NOTE — Progress Notes (Signed)
   PRENATAL VISIT NOTE  Subjective:  Gina Chung is a 44 y.o. [id number] at [redacted]w[redacted]d being seen today for ongoing prenatal care.  She is currently monitored for the following issues for this high-risk pregnancy and has AMA (advanced maternal age) multigravida 35+; Language barrier; Elevated hemoglobin A1c measurement; Supervision of high-risk pregnancy; Maternal obesity affecting pregnancy, antepartum; Gestational diabetes mellitus (GDM) affecting pregnancy, antepartum; and Anemia affecting pregnancy in third trimester on their problem list.  Patient reports no complaints.  Contractions: Irregular. Vag. Bleeding: None.  Movement: Present. Denies leaking of fluid.   The following portions of the patient's history were reviewed and updated as appropriate: allergies, current medications, past family history, past medical history, past social history, past surgical history and problem list.   Objective:   Vitals:   02/07/20 1531  BP: 119/76  Pulse: 97  Weight: 87.5 kg    Fetal Status: Fetal Heart Rate (bpm): 135   Movement: Present     General:  Alert, oriented and cooperative. Patient is in no acute distress.  Skin: Skin is warm and dry. No rash noted.   Cardiovascular: Normal heart rate noted  Respiratory: Normal respiratory effort, no problems with respiration noted  Abdomen: Soft, gravid, appropriate for gestational age.  Pain/Pressure: Present     Pelvic: Cervical exam deferred        Extremities: Normal range of motion.  Edema: Trace  Mental Status: Normal mood and affect. Normal behavior. Normal judgment and thought content.   Assessment and Plan:  Pregnancy: [id number] at [redacted]w[redacted]d 1. Gestational diabetes mellitus (GDM) affecting pregnancy, antepartum Good control on insulin  2. Language barrier Speaks English  3. Supervision of high risk pregnancy in third trimester BPP 8/8 today  4. Multigravida of advanced maternal age in third trimester 62 week IOL  Term labor symptoms and  general obstetric precautions including but not limited to vaginal bleeding, contractions, leaking of fluid and fetal movement were reviewed in detail with the patient. Please refer to After Visit Summary for other counseling recommendations.   Return in about 1 week (around 02/14/2020).  Future Appointments  Date Time Provider Department Center  02/13/2020  9:45 AM Malachy Chamber, MD [facility] None  02/13/2020  3:00 PM [facility] NURSE [facility]  02/13/2020  3:00 PM [facility] US1 [facility]  02/21/2020 12:45 PM [facility] NURSE [facility]  02/21/2020 12:45 PM [facility] US4 [facility]    Scheryl Darter, MD

## 2020-02-08 ENCOUNTER — Other Ambulatory Visit: Payer: Self-pay | Admitting: Family Medicine

## 2020-02-08 ENCOUNTER — Other Ambulatory Visit (HOSPITAL_COMMUNITY): Payer: Self-pay | Admitting: Advanced Practice Midwife

## 2020-02-08 ENCOUNTER — Telehealth (HOSPITAL_COMMUNITY): Payer: Self-pay | Admitting: *Deleted

## 2020-02-08 NOTE — Telephone Encounter (Signed)
Preadmission screen  

## 2020-02-09 ENCOUNTER — Encounter (HOSPITAL_COMMUNITY): Payer: Self-pay | Admitting: *Deleted

## 2020-02-09 ENCOUNTER — Telehealth (HOSPITAL_COMMUNITY): Payer: Self-pay | Admitting: *Deleted

## 2020-02-09 NOTE — Telephone Encounter (Signed)
Preadmission screen  

## 2020-02-13 ENCOUNTER — Other Ambulatory Visit: Payer: Self-pay | Admitting: Obstetrics & Gynecology

## 2020-02-13 ENCOUNTER — Ambulatory Visit: Payer: Medicaid Other | Admitting: *Deleted

## 2020-02-13 ENCOUNTER — Ambulatory Visit (INDEPENDENT_AMBULATORY_CARE_PROVIDER_SITE_OTHER): Payer: 59 | Admitting: Advanced Practice Midwife

## 2020-02-13 ENCOUNTER — Ambulatory Visit: Payer: Medicaid Other | Attending: Maternal & Fetal Medicine

## 2020-02-13 ENCOUNTER — Other Ambulatory Visit: Payer: Self-pay

## 2020-02-13 ENCOUNTER — Encounter: Payer: Self-pay | Admitting: Obstetrics & Gynecology

## 2020-02-13 VITALS — BP 125/78 | Wt 187.0 lb

## 2020-02-13 DIAGNOSIS — O09523 Supervision of elderly multigravida, third trimester: Secondary | ICD-10-CM

## 2020-02-13 DIAGNOSIS — Z3A38 38 weeks gestation of pregnancy: Secondary | ICD-10-CM | POA: Diagnosis not present

## 2020-02-13 DIAGNOSIS — O99210 Obesity complicating pregnancy, unspecified trimester: Secondary | ICD-10-CM | POA: Insufficient documentation

## 2020-02-13 DIAGNOSIS — Z789 Other specified health status: Secondary | ICD-10-CM

## 2020-02-13 DIAGNOSIS — O0993 Supervision of high risk pregnancy, unspecified, third trimester: Secondary | ICD-10-CM | POA: Diagnosis present

## 2020-02-13 DIAGNOSIS — O24414 Gestational diabetes mellitus in pregnancy, insulin controlled: Secondary | ICD-10-CM | POA: Diagnosis not present

## 2020-02-13 DIAGNOSIS — O24419 Gestational diabetes mellitus in pregnancy, unspecified control: Secondary | ICD-10-CM | POA: Diagnosis present

## 2020-02-13 DIAGNOSIS — N898 Other specified noninflammatory disorders of vagina: Secondary | ICD-10-CM

## 2020-02-13 DIAGNOSIS — O99013 Anemia complicating pregnancy, third trimester: Secondary | ICD-10-CM

## 2020-02-13 DIAGNOSIS — O26893 Other specified pregnancy related conditions, third trimester: Secondary | ICD-10-CM

## 2020-02-13 DIAGNOSIS — Z794 Long term (current) use of insulin: Secondary | ICD-10-CM | POA: Diagnosis not present

## 2020-02-13 DIAGNOSIS — O24415 Gestational diabetes mellitus in pregnancy, controlled by oral hypoglycemic drugs: Secondary | ICD-10-CM | POA: Insufficient documentation

## 2020-02-13 MED ORDER — TERCONAZOLE 0.8 % VA CREA
1.0000 | TOPICAL_CREAM | Freq: Every day | VAGINAL | 0 refills | Status: DC
Start: 2020-02-13 — End: 2020-02-18

## 2020-02-13 NOTE — Patient Instructions (Signed)

## 2020-02-13 NOTE — Progress Notes (Signed)
Pt presents for ROB CBG readings WNL per pt; she did not bring log IOL already scheduled 02/16/20 Reqs cx check today

## 2020-02-13 NOTE — Progress Notes (Signed)
   PRENATAL VISIT NOTE  Subjective:  Gina Chung is a 44 y.o. E0P2330 at [redacted]w[redacted]d being seen today for ongoing prenatal care.  She is currently monitored for the following issues for this high-risk pregnancy and has AMA (advanced maternal age) multigravida 35+; Language barrier; Elevated hemoglobin A1c measurement; Supervision of high-risk pregnancy; Maternal obesity affecting pregnancy, antepartum; Gestational diabetes mellitus (GDM) affecting pregnancy, antepartum; and Anemia affecting pregnancy in third trimester on their problem list.  Patient reports occasional contractions.  Contractions: Irregular. Vag. Bleeding: None.  Movement: Present. Denies leaking of fluid.   The following portions of the patient's history were reviewed and updated as appropriate: allergies, current medications, past family history, past medical history, past social history, past surgical history and problem list. Problem list updated.  Objective:   Vitals:   02/13/20 0952  BP: 125/78  Weight: 187 lb (84.8 kg)    Fetal Status: Fetal Heart Rate (bpm): 130   Movement: Present  Presentation: Vertex  General:  Alert, oriented and cooperative. Patient is in no acute distress.  Skin: Skin is warm and dry. No rash noted.   Cardiovascular: Normal heart rate noted  Respiratory: Normal respiratory effort, no problems with respiration noted  Abdomen: Soft, gravid, appropriate for gestational age.  Pain/Pressure: Present     Pelvic: Cervical exam performed per patient request  Dilation: 1.5 Effacement (%): 60 Station: -2  Extremities: Normal range of motion.  Edema: Trace  Mental Status: Normal mood and affect. Normal behavior. Normal judgment and thought content.   Assessment and Plan:  Pregnancy: [id number] at [redacted]w[redacted]d  1. Supervision of high risk pregnancy in third trimester   2. Gestational diabetes mellitus (GDM) affecting pregnancy, antepartum - Reports CBGs WNL, log not brought to today's appt - IOL previously  scheduled for 02/16/2020  3. Language barrier - Interpreter not required  4. Vaginal discharge during pregnancy in third trimester - Terazol 3 day per patient request and c/w thick curd-like discharge noted during cervical exam  Term labor symptoms and general obstetric precautions including but not limited to vaginal bleeding, contractions, leaking of fluid and fetal movement were reviewed in detail with the patient. Please refer to After Visit Summary for other counseling recommendations.  No follow-ups on file.  Future Appointments  Date Time Provider Department Center  02/13/2020  3:00 PM [facility] NURSE [facility]  02/13/2020  3:00 PM [facility] US1 [facility]  02/14/2020  8:05 AM MC-SCREENING [facility]-SDSC None  02/16/2020  6:30 AM MC-LD SCHED ROOM MC-INDC None  02/21/2020 12:45 PM [facility] NURSE [facility]  02/21/2020 12:45 PM [facility] US4 [facility]    Calvert Cantor, CNM

## 2020-02-14 ENCOUNTER — Other Ambulatory Visit (HOSPITAL_COMMUNITY)
Admission: RE | Admit: 2020-02-14 | Discharge: 2020-02-14 | Disposition: A | Discharge status: Home / Self Care | Payer: Medicaid Other | Source: Ambulatory Visit | Attending: Family Medicine | Admitting: Family Medicine

## 2020-02-14 DIAGNOSIS — Z01812 Encounter for preprocedural laboratory examination: Secondary | ICD-10-CM | POA: Diagnosis not present

## 2020-02-14 DIAGNOSIS — Z20822 Contact with and (suspected) exposure to covid-19: Secondary | ICD-10-CM | POA: Insufficient documentation

## 2020-02-14 LAB — SARS CORONAVIRUS 2 (TAT 6-24 HRS): SARS Coronavirus 2: NEGATIVE

## 2020-02-15 ENCOUNTER — Other Ambulatory Visit: Payer: Self-pay | Admitting: Family Medicine

## 2020-02-16 ENCOUNTER — Inpatient Hospital Stay (HOSPITAL_COMMUNITY): Payer: Medicaid Other | Admitting: Anesthesiology

## 2020-02-16 ENCOUNTER — Inpatient Hospital Stay (HOSPITAL_COMMUNITY)
Admission: AD | Admit: 2020-02-16 | Discharge: 2020-02-18 | DRG: 806 | Disposition: A | Discharge status: Home / Self Care | Payer: Medicaid Other | Attending: Obstetrics and Gynecology | Admitting: Obstetrics and Gynecology

## 2020-02-16 ENCOUNTER — Inpatient Hospital Stay (HOSPITAL_COMMUNITY): Payer: Medicaid Other

## 2020-02-16 ENCOUNTER — Other Ambulatory Visit: Payer: Self-pay

## 2020-02-16 ENCOUNTER — Encounter (HOSPITAL_COMMUNITY): Payer: Self-pay | Admitting: Obstetrics & Gynecology

## 2020-02-16 DIAGNOSIS — O99210 Obesity complicating pregnancy, unspecified trimester: Secondary | ICD-10-CM | POA: Diagnosis present

## 2020-02-16 DIAGNOSIS — O24424 Gestational diabetes mellitus in childbirth, insulin controlled: Principal | ICD-10-CM | POA: Diagnosis present

## 2020-02-16 DIAGNOSIS — O99214 Obesity complicating childbirth: Secondary | ICD-10-CM | POA: Diagnosis present

## 2020-02-16 DIAGNOSIS — E669 Obesity, unspecified: Secondary | ICD-10-CM | POA: Diagnosis present

## 2020-02-16 DIAGNOSIS — D62 Acute posthemorrhagic anemia: Secondary | ICD-10-CM | POA: Diagnosis not present

## 2020-02-16 DIAGNOSIS — O99013 Anemia complicating pregnancy, third trimester: Secondary | ICD-10-CM | POA: Diagnosis present

## 2020-02-16 DIAGNOSIS — O9081 Anemia of the puerperium: Secondary | ICD-10-CM | POA: Diagnosis not present

## 2020-02-16 DIAGNOSIS — R7309 Other abnormal glucose: Secondary | ICD-10-CM | POA: Diagnosis present

## 2020-02-16 DIAGNOSIS — O09523 Supervision of elderly multigravida, third trimester: Secondary | ICD-10-CM

## 2020-02-16 DIAGNOSIS — O24419 Gestational diabetes mellitus in pregnancy, unspecified control: Secondary | ICD-10-CM | POA: Diagnosis present

## 2020-02-16 DIAGNOSIS — O09529 Supervision of elderly multigravida, unspecified trimester: Secondary | ICD-10-CM

## 2020-02-16 DIAGNOSIS — O0993 Supervision of high risk pregnancy, unspecified, third trimester: Secondary | ICD-10-CM

## 2020-02-16 DIAGNOSIS — Z3A39 39 weeks gestation of pregnancy: Secondary | ICD-10-CM

## 2020-02-16 LAB — CBC
HCT: 31.2 % — ABNORMAL LOW (ref 36.0–46.0)
Hemoglobin: 9.7 g/dL — ABNORMAL LOW (ref 12.0–15.0)
MCH: 27.4 pg (ref 26.0–34.0)
MCHC: 31.1 g/dL (ref 30.0–36.0)
MCV: 88.1 fL (ref 80.0–100.0)
Platelets: 273 10*3/uL (ref 150–400)
RBC: 3.54 MIL/uL — ABNORMAL LOW (ref 3.87–5.11)
RDW: 14.1 % (ref 11.5–15.5)
WBC: 5.9 10*3/uL (ref 4.0–10.5)
nRBC: 0 % (ref 0.0–0.2)

## 2020-02-16 LAB — ABO/RH: ABO/RH(D): O POS

## 2020-02-16 LAB — GLUCOSE, CAPILLARY
Glucose-Capillary: 111 mg/dL — ABNORMAL HIGH (ref 70–99)
Glucose-Capillary: 64 mg/dL — ABNORMAL LOW (ref 70–99)
Glucose-Capillary: 74 mg/dL (ref 70–99)
Glucose-Capillary: 68 mg/dL — ABNORMAL LOW (ref 70–99)
Glucose-Capillary: 95 mg/dL (ref 70–99)
Glucose-Capillary: 65 mg/dL — ABNORMAL LOW (ref 70–99)
Glucose-Capillary: 70 mg/dL (ref 70–99)
Glucose-Capillary: 77 mg/dL (ref 70–99)

## 2020-02-16 LAB — TYPE AND SCREEN
ABO/RH(D): O POS
Antibody Screen: NEGATIVE

## 2020-02-16 LAB — HEMOGLOBIN A1C
Hgb A1c MFr Bld: 6.1 % — ABNORMAL HIGH (ref 4.8–5.6)
Mean Plasma Glucose: 128.37 mg/dL

## 2020-02-16 MED ORDER — SENNOSIDES-DOCUSATE SODIUM 8.6-50 MG PO TABS
2.0000 | ORAL_TABLET | ORAL | Status: DC
  Administered 2020-02-16 – 2020-02-17 (×2): 2 via ORAL
  Filled 2020-02-16 (×3): qty 2

## 2020-02-16 MED ORDER — OXYTOCIN-SODIUM CHLORIDE 30-0.9 UT/500ML-% IV SOLN
2.5000 [IU]/h | INTRAVENOUS | Status: DC
  Administered 2020-02-16: 2.5 [IU]/h via INTRAVENOUS

## 2020-02-16 MED ORDER — WITCH HAZEL-GLYCERIN EX PADS: 1.0000 "application " | MEDICATED_PAD | CUTANEOUS | Status: DC | PRN

## 2020-02-16 MED ORDER — ONDANSETRON HCL 4 MG PO TABS: 4.0000 mg | ORAL_TABLET | ORAL | Status: DC | PRN

## 2020-02-16 MED ORDER — SIMETHICONE 80 MG PO CHEW
80.0000 mg | CHEWABLE_TABLET | ORAL | Status: DC | PRN
  Filled 2020-02-16: qty 1

## 2020-02-16 MED ORDER — LACTATED RINGERS IV SOLN: 500.0000 mL | INTRAVENOUS | Status: DC | PRN

## 2020-02-16 MED ORDER — IBUPROFEN 600 MG PO TABS
600.0000 mg | ORAL_TABLET | Freq: Three times a day (TID) | ORAL | Status: DC | PRN
  Administered 2020-02-17 – 2020-02-18 (×5): 600 mg via ORAL
  Filled 2020-02-16 (×7): qty 1

## 2020-02-16 MED ORDER — SOD CITRATE-CITRIC ACID 500-334 MG/5ML PO SOLN
30.0000 mL | ORAL | Status: DC | PRN
  Administered 2020-02-16: 30 mL via ORAL
  Filled 2020-02-16: qty 30

## 2020-02-16 MED ORDER — TETANUS-DIPHTH-ACELL PERTUSSIS 5-2.5-18.5 LF-MCG/0.5 IM SUSP: 0.5000 mL | Freq: Once | INTRAMUSCULAR | Status: DC

## 2020-02-16 MED ORDER — COCONUT OIL OIL: 1.0000 "application " | TOPICAL_OIL | Status: DC | PRN

## 2020-02-16 MED ORDER — PHENYLEPHRINE 40 MCG/ML (10ML) SYRINGE FOR IV PUSH (FOR BLOOD PRESSURE SUPPORT)
80.0000 ug | PREFILLED_SYRINGE | INTRAVENOUS | Status: DC | PRN
  Filled 2020-02-16: qty 10

## 2020-02-16 MED ORDER — TRANEXAMIC ACID-NACL 1000-0.7 MG/100ML-% IV SOLN
1000.0000 mg | INTRAVENOUS | Status: AC
  Administered 2020-02-16: 1000 mg via INTRAVENOUS
  Filled 2020-02-16: qty 100

## 2020-02-16 MED ORDER — OXYCODONE-ACETAMINOPHEN 5-325 MG PO TABS: 1.0000 | ORAL_TABLET | ORAL | Status: DC | PRN

## 2020-02-16 MED ORDER — OXYCODONE-ACETAMINOPHEN 5-325 MG PO TABS: 2.0000 | ORAL_TABLET | ORAL | Status: DC | PRN

## 2020-02-16 MED ORDER — DIBUCAINE (PERIANAL) 1 % EX OINT: 1.0000 "application " | TOPICAL_OINTMENT | CUTANEOUS | Status: DC | PRN

## 2020-02-16 MED ORDER — TERBUTALINE SULFATE 1 MG/ML IJ SOLN: 0.2500 mg | Freq: Once | INTRAMUSCULAR | Status: DC | PRN

## 2020-02-16 MED ORDER — BENZOCAINE-MENTHOL 20-0.5 % EX AERO: 1.0000 "application " | INHALATION_SPRAY | CUTANEOUS | Status: DC | PRN

## 2020-02-16 MED ORDER — ACETAMINOPHEN 325 MG PO TABS
650.0000 mg | ORAL_TABLET | Freq: Four times a day (QID) | ORAL | Status: DC | PRN
  Administered 2020-02-17 (×2): 650 mg via ORAL
  Filled 2020-02-16 (×2): qty 2

## 2020-02-16 MED ORDER — PHENYLEPHRINE 40 MCG/ML (10ML) SYRINGE FOR IV PUSH (FOR BLOOD PRESSURE SUPPORT): 80.0000 ug | PREFILLED_SYRINGE | INTRAVENOUS | Status: DC | PRN

## 2020-02-16 MED ORDER — EPHEDRINE 5 MG/ML INJ: 10.0000 mg | INTRAVENOUS | Status: DC | PRN

## 2020-02-16 MED ORDER — PRENATAL MULTIVITAMIN CH
1.0000 | ORAL_TABLET | Freq: Every day | ORAL | Status: DC
  Administered 2020-02-17: 1 via ORAL
  Filled 2020-02-16: qty 1

## 2020-02-16 MED ORDER — ONDANSETRON HCL 4 MG/2ML IJ SOLN: 4.0000 mg | INTRAMUSCULAR | Status: DC | PRN

## 2020-02-16 MED ORDER — DIPHENHYDRAMINE HCL 50 MG/ML IJ SOLN
12.5000 mg | INTRAMUSCULAR | Status: DC | PRN
  Administered 2020-02-16: 12.5 mg via INTRAVENOUS
  Filled 2020-02-16: qty 1

## 2020-02-16 MED ORDER — LACTATED RINGERS IV SOLN
500.0000 mL | Freq: Once | INTRAVENOUS | Status: AC
  Administered 2020-02-16: 500 mL via INTRAVENOUS

## 2020-02-16 MED ORDER — LIDOCAINE HCL (PF) 1 % IJ SOLN: 30.0000 mL | INTRAMUSCULAR | Status: DC | PRN

## 2020-02-16 MED ORDER — MEASLES, MUMPS & RUBELLA VAC IJ SOLR: 0.5000 mL | Freq: Once | INTRAMUSCULAR | Status: DC

## 2020-02-16 MED ORDER — OXYTOCIN BOLUS FROM INFUSION
500.0000 mL | Freq: Once | INTRAVENOUS | Status: AC
  Administered 2020-02-16: 500 mL/h via INTRAVENOUS

## 2020-02-16 MED ORDER — ACETAMINOPHEN 325 MG PO TABS: 650.0000 mg | ORAL_TABLET | ORAL | Status: DC | PRN

## 2020-02-16 MED ORDER — LACTATED RINGERS IV SOLN: INTRAVENOUS | Status: DC

## 2020-02-16 MED ORDER — SODIUM CHLORIDE 0.9 % IV SOLN
510.0000 mg | Freq: Once | INTRAVENOUS | Status: AC
  Administered 2020-02-17: 510 mg via INTRAVENOUS
  Filled 2020-02-16: qty 17

## 2020-02-16 MED ORDER — FENTANYL-BUPIVACAINE-NACL 0.5-0.125-0.9 MG/250ML-% EP SOLN
12.0000 mL/h | EPIDURAL | Status: DC | PRN
  Filled 2020-02-16: qty 250

## 2020-02-16 MED ORDER — SODIUM CHLORIDE (PF) 0.9 % IJ SOLN
INTRAMUSCULAR | Status: DC | PRN
  Administered 2020-02-16: 12 mL/h via EPIDURAL

## 2020-02-16 MED ORDER — ONDANSETRON HCL 4 MG/2ML IJ SOLN: 4.0000 mg | Freq: Four times a day (QID) | INTRAMUSCULAR | Status: DC | PRN

## 2020-02-16 MED ORDER — DIPHENHYDRAMINE HCL 25 MG PO CAPS: 25.0000 mg | ORAL_CAPSULE | Freq: Four times a day (QID) | ORAL | Status: DC | PRN

## 2020-02-16 MED ORDER — OXYTOCIN-SODIUM CHLORIDE 30-0.9 UT/500ML-% IV SOLN
1.0000 m[IU]/min | INTRAVENOUS | Status: DC
  Administered 2020-02-16: 2 m[IU]/min via INTRAVENOUS
  Filled 2020-02-16: qty 500

## 2020-02-16 NOTE — Discharge Summary (Signed)
Postpartum Discharge Summary  Date of Service updated 02/18/20     Patient Name: Gina Chung DOB: 17-Apr-1976 MRN: [id number]  Date of admission: 02/16/2020 Delivery date:02/16/2020  Delivering provider: Lenna Sciara  Date of discharge: 02/18/2020  Admitting diagnosis: Gestational diabetes mellitus (GDM) affecting pregnancy, antepartum [O24.419] Intrauterine pregnancy: [redacted]w[redacted]d    Secondary diagnosis:  Active Problems:   AMA (advanced maternal age) multigravida 35+   Elevated hemoglobin A1c measurement   Maternal obesity affecting pregnancy, antepartum   Gestational diabetes mellitus (GDM) affecting pregnancy, antepartum   Anemia affecting pregnancy in third trimester  Additional problems: None    Discharge diagnosis: Term Pregnancy Delivered, GDM A2 and Anemia                                              Post partum procedures:IV iron  Augmentation: AROM and Pitocin Complications: None  Hospital course: Induction of Labor With Vaginal Delivery   44y.o. yo [id number] 42 5w0das admitted to the hospital 02/16/2020 for induction of labor.  Indication for induction: A2 DM and AMA.  Patient had an uncomplicated labor course as follows: Initial SVE 2.5/50/-3. Patient received Pitocin and AROM. Received epidural and progressed to complete with uncomplicated delivery.  Membrane Rupture Time/Date: 2:08 PM ,02/16/2020   Delivery Method:Vaginal, Spontaneous  Episiotomy: None  Lacerations:  None  Details of delivery can be found in separate delivery note. Repeat A1c 6.1 on admission. Fasting AM glucose 120. Patient was given IV iron post-partum. Patient had a routine postpartum course. Patient is discharged home 02/18/20.  Newborn Data: Birth date:02/16/2020  Birth time:8:24 PM  Gender:Female  Living status:Living  Apgars:9 ,9  Weight:3235 g   Magnesium Sulfate received: No BMZ received: No Rhophylac:No MMR:No T-DaP:declined Flu: No Transfusion:No  Physical exam  Vitals:   02/17/20  0802 02/17/20 1215 02/17/20 2142 02/18/20 0511  BP: 100/66 116/72 110/67 (!) 107/57  Pulse: 66 68 69 67  Resp: '16 17 18 18  '$ Temp: 98 F (36.7 C) 97.9 F (36.6 C) 97.9 F (36.6 C) 98.2 F (36.8 C)  TempSrc:  Oral Oral Oral  SpO2:   100%   Weight:      Height:       General: alert, cooperative and no distress Lochia: appropriate Uterine Fundus: firm Incision: N/A DVT Evaluation: No evidence of DVT seen on physical exam. Labs: Lab Results  Component Value Date   WBC 5.9 02/16/2020   HGB 9.7 (L) 02/16/2020   HCT 31.2 (L) 02/16/2020   MCV 88.1 02/16/2020   PLT 273 02/16/2020   CMP Latest Ref Rng & Units 10/31/2019  Glucose 65 - 99 mg/dL 123(H)  BUN 6 - 24 mg/dL 6  Creatinine 0.57 - 1.00 mg/dL 0.50(L)  Sodium 134 - 144 mmol/L 137  Potassium 3.5 - 5.2 mmol/L 3.9  Chloride 96 - 106 mmol/L 107(H)  CO2 20 - 29 mmol/L 17(L)  Calcium 8.7 - 10.2 mg/dL 9.1  Total Protein 6.0 - 8.5 g/dL 6.4  Total Bilirubin 0.0 - 1.2 mg/dL <0.2  Alkaline Phos 39 - 117 IU/L 97  AST 0 - 40 IU/L 10  ALT 0 - 32 IU/L 10   Edinburgh Score: Edinburgh Postnatal Depression Scale Screening Tool 02/17/2020  I have been able to laugh and see the funny side of things. 0  I have looked forward with enjoyment  to things. 0  I have blamed myself unnecessarily when things went wrong. 0  I have been anxious or worried for no good reason. 0  I have felt scared or panicky for no good reason. 0  Things have been getting on top of me. 0  I have been so unhappy that I have had difficulty sleeping. 0  I have felt sad or miserable. 0  I have been so unhappy that I have been crying. 0  The thought of harming myself has occurred to me. 0  Edinburgh Postnatal Depression Scale Total 0     After visit meds:  Allergies as of 02/18/2020   No Known Allergies     Medication List    STOP taking these medications   aspirin EC 81 MG tablet   Blood Pressure Kit Devi   insulin detemir 100 UNIT/ML FlexPen Commonly known  as: LEVEMIR   terconazole 0.8 % vaginal cream Commonly known as: TERAZOL 3   Vitafol Ultra 29-0.6-0.4-200 MG Caps     TAKE these medications   ibuprofen 600 MG tablet Commonly known as: ADVIL Take 1 tablet (600 mg total) by mouth every 8 (eight) hours as needed for mild pain.   iron polysaccharides 150 MG capsule Commonly known as: NIFEREX Take 1 capsule (150 mg total) by mouth daily.   MULTI-VITAMIN DAILY PO Take by mouth.   norethindrone 0.35 MG tablet Commonly known as: MICRONOR Take 1 tablet (0.35 mg total) by mouth daily. Start 3 weeks postpartum. Start taking on: March 10, 2020   omeprazole 20 MG capsule Commonly known as: PRILOSEC Take 20 mg by mouth daily.   oxyCODONE-acetaminophen 5-325 MG tablet Commonly known as: Percocet Take 1 tablet by mouth every 6 (six) hours as needed for severe pain.        Discharge home in stable condition Infant Feeding: Breast Infant Disposition:home with mother Discharge instruction: per After Visit Summary and Postpartum booklet. Activity: Advance as tolerated. Pelvic rest for 6 weeks.  Diet: routine diet Future Appointments:No future appointments. Follow up Visit: [place] Follow up.   Why: In 4-6 weeks  Contact information: [address] [place] [address] [phone]           Please schedule this patient for a In person postpartum visit in 4 weeks with the following provider: Any provider. Additional Postpartum F/U:glucose monitoring  High risk pregnancy complicated by: GDM Delivery mode:  Vaginal, Spontaneous  Anticipated Birth Control:  POPs   02/18/2020 Fatima Blank, CNM

## 2020-02-16 NOTE — Anesthesia Preprocedure Evaluation (Signed)
Anesthesia Evaluation  Patient identified by MRN, date of birth, ID band Patient awake    Reviewed: Allergy & Precautions, NPO status , Patient's Chart, lab work & pertinent test results  Airway Mallampati: II  TM Distance: >3 FB Neck ROM: Full    Dental no notable dental hx. (+) Teeth Intact   Pulmonary neg pulmonary ROS,    Pulmonary exam normal breath sounds clear to auscultation       Cardiovascular Exercise Tolerance: Good negative cardio ROS Normal cardiovascular exam Rhythm:Regular Rate:Normal     Neuro/Psych  Headaches, negative psych ROS   GI/Hepatic negative GI ROS, Neg liver ROS,   Endo/Other  diabetes, Type 2  Renal/GU negative Renal ROS     Musculoskeletal negative musculoskeletal ROS (+)   Abdominal   Peds  Hematology Hgb 9.7 PLT 273   Anesthesia Other Findings   Reproductive/Obstetrics (+) Pregnancy                             Anesthesia Physical Anesthesia Plan  ASA: II  Anesthesia Plan: Epidural   Post-op Pain Management:    Induction:   PONV Risk Score and Plan:   Airway Management Planned:   Additional Equipment:   Intra-op Plan:   Post-operative Plan:   Informed Consent: I have reviewed the patients History and Physical, chart, labs and discussed the procedure including the risks, benefits and alternatives for the proposed anesthesia with the patient or authorized representative who has indicated his/her understanding and acceptance.       Plan Discussed with: CRNA  Anesthesia Plan Comments: (36.5 G7P6  w hx GDM)        Anesthesia Quick Evaluation

## 2020-02-16 NOTE — H&P (Signed)
OBSTETRIC ADMISSION HISTORY AND PHYSICAL  Gina Chung is a 44 y.o. female [phone] with IUP at 9w0dby LMP c/w UKoreapresenting for IOL for GDMA2/Class B. She reports +FMs, No LOF, no VB, no blurry vision, headaches or peripheral edema, and RUQ pain.  She plans on breast and bottle feeding. She requests POPs for birth control. [facility]   '@[redacted]w[redacted]d'$ , CWD, normal anatomy, cephalic presentation, right lateral placental lie, 2495g, 48% EFW   Prenatal History/Complications: AMA [place] GDMA2/Class B Anemia  Past Medical History: Past Medical History:  Diagnosis Date  . Anemia   . Diabetes mellitus without complication ([place]   . Gestational diabetes   . History of UTI   . Language barrier    arabic and english  . Migraine   . Vaginal Pap smear, abnormal     Past Surgical History: Past Surgical History:  Procedure Laterality Date  . COLPOSCOPY    . DILATION AND EVACUATION N/A 09/11/2016   Procedure: DILATATION AND EVACUATION;  Surgeon: MChancy Milroy MD;  [place] [facility];  Service: Gynecology;  Laterality: N/A;    Obstetrical History: OB History    Gravida  7   Para  4   Term  4   Preterm  0   AB  2   Living  4     SAB  2   TAB  0   Ectopic  0   Multiple  0   Live Births  4           Social History: Social History   Socioeconomic History  . Marital status: Married    Spouse name: Not on file  . Number of children: 3  . Years of education: Not on file  . Highest education level: Not on file  Occupational History  . Occupation: homemaker  Tobacco Use  . Smoking status: Never Smoker  . Smokeless tobacco: Never Used  Substance and Sexual Activity  . Alcohol use: No  . Drug use: No  . Sexual activity: Yes    Partners: Male    Birth control/protection: None  Other Topics Concern  . Not on file  Social History Narrative  . Not on file   Social Determinants of Health   Financial Resource  Strain:   . Difficulty of Paying Living Expenses:   Food Insecurity:   . Worried About RCharity fundraiserin the Last Year:   . RArboriculturistin the Last Year:   Transportation Needs:   . LFilm/video editor(Medical):   .Marland KitchenLack of Transportation (Non-Medical):   Physical Activity:   . Days of Exercise per Week:   . Minutes of Exercise per Session:   Stress:   . Feeling of Stress :   Social Connections:   . Frequency of Communication with Friends and Family:   . Frequency of Social Gatherings with Friends and Family:   . Attends Religious Services:   . Active Member of Clubs or Organizations:   . Attends CArchivistMeetings:   .Marland KitchenMarital Status:     Family History: Family History  Problem Relation Age of Onset  . Rheum arthritis Mother   . Hypertension Mother   . Hypertension Father   . Diabetes Maternal Grandfather     Allergies: No Known Allergies  Facility-Administered Medications Prior to Admission  Medication Dose Route Frequency Provider Last Rate Last Admin  . ferumoxytol ([facility] 510  mg in sodium chloride 0.9 % 100 mL IVPB  510 mg Intravenous Once Constant, Peggy, MD       Medications Prior to Admission  Medication Sig Dispense Refill Last Dose  . insulin detemir (LEVEMIR) 100 UNIT/ML FlexPen Inject 25 units each night at bedtime 15 mL 3 02/15/2020 at Unknown time  . iron polysaccharides (NIFEREX) 150 MG capsule Take 1 capsule (150 mg total) by mouth daily. 30 capsule 2 02/15/2020 at Unknown time  . Multiple Vitamin (MULTI-VITAMIN DAILY PO) Take by mouth.   02/15/2020 at Unknown time  . omeprazole (PRILOSEC) 20 MG capsule Take 20 mg by mouth daily.   02/15/2020 at Unknown time  . aspirin EC 81 MG tablet Take 1 tablet (81 mg total) by mouth daily. Take after 12 weeks for prevention of preeclampsia later in pregnancy (Patient not taking: Reported on 02/16/2020) 300 tablet 2 Not Taking at Unknown time  . Blood Pressure Monitoring (BLOOD PRESSURE KIT) DEVI 1 kit  by Does not apply route once a week. Check Blood Pressure regularly and record readings into the Babys[employer]s App.  Large Cuff.  DX O90.0 1 each 0   . Prenat-Fe Poly-Methfol-FA-DHA (VITAFOL ULTRA) 29-0.6-0.4-200 MG CAPS Take 1 tablet by mouth daily. (Patient not taking: Reported on 02/16/2020) 30 capsule 12 Not Taking at Unknown time  . terconazole (TERAZOL 3) 0.8 % vaginal cream Place 1 applicator vaginally at bedtime. Apply nightly for three nights. (Patient not taking: Reported on 02/13/2020) 20 g 0      Review of Systems   All systems reviewed and negative except as stated in HPI  Blood pressure 121/82, pulse 89, temperature 98 F (36.7 C), temperature source Oral, resp. rate 18, height '5\' 3"'$  (1.6 m), weight 85.5 kg, last menstrual period 05/19/2019. General appearance: alert, cooperative and appears stated age Lungs: normal effort Heart: regular rate  Abdomen: soft, non-tender; bowel sounds normal Pelvic: gravid uterus GU: No vaginal lesions Extremities: Homans sign is negative, no sign of DVT Presentation: cephalic Fetal monitoringBaseline: 125 bpm, Variability: Good {> 6 bpm), Accelerations: Reactive and Decelerations: Absent Uterine activity: None Dilation: 3 Effacement (%): 50 Station: -2 Exam by:: dr Dayonna Selbe   Prenatal labs: ABO, Rh: --/--/PENDING (06/04 1051) Antibody: PENDING (06/04 1051) Rubella: Immune (11/08 0000) RPR: Non Reactive (02/16 1335)  HBsAg:    HIV: Non Reactive (02/16 1335)  GBS: Negative/-- (05/19 0224)  3 hr Glucola abnormal at [facility] screening  Not done Anatomy US normal  Prenatal Transfer Tool  Maternal Diabetes: Yes:  Diabetes Type:  Insulin/Medication controlled Genetic Screening: Declined Maternal Ultrasounds/Referrals: Normal Fetal Ultrasounds or other Referrals:  Fetal echo Maternal Substance Abuse:  No Significant Maternal Medications:  None Significant Maternal Lab Results: Group B Strep negative  Results for orders placed or  performed during the hospital encounter of 02/16/20 (from the past 24 hour(s))  Glucose, capillary   Collection Time: 02/16/20 10:46 AM  Result Value Ref Range   Glucose-Capillary 111 (H) 70 - 99 mg/dL   Comment 1 Document in Chart   Type and screen   Collection Time: 02/16/20 10:51 AM  Result Value Ref Range   ABO/RH(D) PENDING    Antibody Screen PENDING    Sample Expiration      02/19/2020,2359 Performed at [facility], [place] [address] [place], [place] [address]     Patient Active Problem List   Diagnosis Date Noted  . Anemia affecting pregnancy in third trimester 11/21/2019  . Supervision of high-risk pregnancy 10/31/2019  .  Maternal obesity affecting pregnancy, antepartum 10/31/2019  . Gestational diabetes mellitus (GDM) affecting pregnancy, antepartum 10/31/2019  . Elevated hemoglobin A1c measurement 09/16/2016  . AMA (advanced maternal age) multigravida 35+ 09/03/2016  . Language barrier 09/03/2016    Assessment/Plan:  Gina Chung is a 44 y.o. [id number] at 40w0dhere for IOL for GDMA2/Class B.  #Labor: Vertex by exam. Cervix favorable in multip. Will start Pitc. AROM when good pattern. Anticipate SVD. #Pain: Per patient request #FWB: Cat I; EFW: 3500g #ID:  GBS neg #MOF: Both #MOC: POPs #Circ:  NA; girl #Anemia: Last Hgb 9.3. Will give TXA prior to delivery. Consider IV iron PP.  #GDMA2/Class B: Hgb A1c 5.7 in Nov 2020. [facility]. Will recheck A1c and fasting AM glucose PP. CBG's q4h latent, q2h active.   CBarrington Ellison MD OMayaguez Medical CenterFamily Medicine Fellow, FTexas End[facility] Centers [facility] D[facility] Texas Endoscopyfor W[facility] CM[facility]Group 02/16/2020, 11:51 AM

## 2020-02-16 NOTE — Progress Notes (Signed)
Hypoglycemic Event  CBG: 65  Treatment: 4 oz apple juice  Symptoms: n/a  Follow-up CBG: Time: 2106 CBG Result:70  Possible Reasons for Event:  Post Delivery  Comments/MD notified: CBG increased with intervention     [employer]

## 2020-02-16 NOTE — Progress Notes (Addendum)
Hypoglycemic Event  CBG: 68  Treatment: 4 oz juice/soda  Symptoms: None  Follow-up CBG: Time:1825 CBG Result:95  Possible Reasons for Event: Inadequate meal intake  Comments/MD notified:1800    Windle Guard

## 2020-02-16 NOTE — Progress Notes (Addendum)
Hypoglycemic Event  CBG: 64  Treatment: 4 oz juice/soda  Symptoms: None  Follow-up CBG: Time:1430 CBG Result:74  Possible Reasons for Event: Inadequate meal intake  Comments/MD notified:1417 Dr Aline Brochure, Toney Sang

## 2020-02-16 NOTE — Progress Notes (Signed)
Patient ID: Gina Chung, female   DOB: 10-13-1975, 44 y.o.   MRN: [id number]  BRYTNEY Chung is a 44 y.o. [id number] at [redacted]w[redacted]d admitted for GDMA2/Class B.  Subjective: Feeling well, pain well controlled with epidural.   Objective: BP 120/72   Pulse 90   Temp 97.6 F (36.4 C) (Oral)   Resp 14   Ht 5\' 3"  (1.6 m)   Wt 85.5 kg   LMP 05/19/2019   SpO2 100%   BMI 33.37 kg/m  No intake/output data recorded.  FHT:  FHR: 120 bpm, variability: moderate,  accelerations:  Present,  decelerations:  Absent UC:   regular, every 2-3 minutes  SVE:   Dilation: 9 Effacement (%): 90 Station: 0 Exam by:: Dr. [email or web address]  Pitocin @ 18 mu/min  Labs: Lab Results  Component Value Date   WBC 5.9 02/16/2020   HGB 9.7 (L) 02/16/2020   HCT 31.2 (L) 02/16/2020   MCV 88.1 02/16/2020   PLT 273 02/16/2020    Assessment / Plan: 44 y.o. 46 [redacted]w[redacted]d here for IOL gor GDMA2/Class B.  Labor: Progressing well.  Continue present management.  Fetal Wellbeing:  Category I Pain Control:  Epidural I/D:  GBS negative Anticipated MOD:  Vaginal delivery  GDMA2: Last glucose 95  Gina Chung [redacted]w[redacted]d, MD PGY-2 Resident Family Medicine 02/16/2020, 7:49 PM

## 2020-02-16 NOTE — Anesthesia Procedure Notes (Signed)
Epidural Patient location during procedure: OB Start time: 02/16/2020 5:15 PM End time: 02/16/2020 5:28 PM  Staffing Anesthesiologist: Trevor Iha, MD Performed: anesthesiologist   Preanesthetic Checklist Completed: patient identified, IV checked, site marked, risks and benefits discussed, surgical consent, monitors and equipment checked, pre-op evaluation and timeout performed  Epidural Patient position: sitting Prep: DuraPrep and site prepped and draped Patient monitoring: continuous pulse ox and blood pressure Approach: midline Injection technique: LOR air  Needle:  Needle type: Tuohy  Needle gauge: 17 G Needle length: 9 cm and 9 Needle insertion depth: 7 cm Catheter type: closed end flexible Catheter size: 19 Gauge Catheter at skin depth: 13 cm Test dose: negative  Assessment Events: blood not aspirated, injection not painful, no injection resistance, no paresthesia and negative IV test  Additional Notes Patient identified. Risks/Benefits/Options discussed with patient including but not limited to bleeding, infection, nerve damage, paralysis, failed block, incomplete pain control, headache, blood pressure changes, nausea, vomiting, reactions to medication both or allergic, itching and postpartum back pain. Confirmed with bedside nurse the patient's most recent platelet count. Confirmed with patient that they are not currently taking any anticoagulation, have any bleeding history or any family history of bleeding disorders. Patient expressed understanding and wished to proceed. All questions were answered. Sterile technique was used throughout the entire procedure. Please see nursing notes for vital signs. Test dose was given through epidural needle and negative prior to continuing to dose epidural or start infusion. Warning signs of high block given to the patient including shortness of breath, tingling/numbness in hands, complete motor block, or any concerning symptoms with  instructions to call for help. Patient was given instructions on fall risk and not to get out of bed. All questions and concerns addressed with instructions to call with any issues.  1 Attempt (S) . Patient tolerated procedure well.

## 2020-02-16 NOTE — Progress Notes (Signed)
Labor Progress Note Gina Chung is a 44 y.o. [id number] at [redacted]w[redacted]d presented for GDMA2/Class B. S: Feeling some ctx. Agreeable to AROM.  O:  BP 120/63   Pulse 67   Temp 98 F (36.7 C) (Oral)   Resp 20   Ht 5\' 3"  (1.6 m)   Wt 85.5 kg   LMP 05/19/2019   BMI 33.37 kg/m  EFM: 125, moderate variability, pos accels, no decels, reactive TOCO: q58m  CVE: Dilation: (P) 3.5 Effacement (%): 50 Station: -2 Presentation: Vertex Exam by:: dr Tamekia Rotter   A&P: 44 y.o. 46 [redacted]w[redacted]d here for IOL gor GDMA2/Class B. #Labor: Progressing well. Cont Pit. AROM with clear fluid at this exam. Anticipate SVD. #Pain: per patient request #FWB: Cat I #GBS negative #GDMA2: last glucose 111  [redacted]w[redacted]d, MD 2:09 PM

## 2020-02-17 LAB — GLUCOSE, CAPILLARY: Glucose-Capillary: 120 mg/dL — ABNORMAL HIGH (ref 70–99)

## 2020-02-17 LAB — RPR: RPR Ser Ql: NONREACTIVE

## 2020-02-17 MED ORDER — FERROUS SULFATE 325 (65 FE) MG PO TABS
325.0000 mg | ORAL_TABLET | ORAL | Status: DC
  Filled 2020-02-17: qty 1

## 2020-02-17 NOTE — Lactation Note (Signed)
This note was copied from a baby's chart. Lactation Consultation Note  Patient Name: Gina Chung [id number] Date: 02/17/2020 Reason for consult: Follow-up assessment  P5 mother whose infant is now 63 hours old.  This is a term baby at 39+0 weeks.  Mother breast fed her other children (ranging in duration from 6 months to 21 months).    Mother had no questions/concerns related to breast feeding.  She is confident in her ability to breast feed and will call if needed.  No support person present at this time.   Maternal Data    Feeding    LATCH Score                   Interventions    Lactation Tools Discussed/Used     Consult Status Consult Status: Follow-up Date: 02/18/20 Follow-up type: In-patient    Gina Chung 02/17/2020, 11:13 AM

## 2020-02-17 NOTE — Progress Notes (Signed)
Went in at 0500 to do fasting blood sugar on pt. I got 120 and informed the pt of her results. She informed me that it was not a fasting result because she ate some breakfast before I came in. I will pass this along to dayshift.

## 2020-02-17 NOTE — Lactation Note (Signed)
This note was copied from a baby's chart. Lactation Consultation Note Baby is 4 hrs old. Mom's 5th child. Mom BF all 4 other children. BF 1 yr 9 months, 1 yr 8 months, 9 months, 6 months.  Mom stated had no issues w/milk supply until her last child didn't want the breast anymore so mom pumped and bottle fed and her milk decreased after 2 months of pumping.  Mom stated this baby BF well since birth.  Mom is uncontrolled DM. Baby's glucose WDL so far. LC hand expressed 2 ml colostrum and spoon fed baby.  Mom is finishing up receiving blood products. Mom is very tired. FOB sleeping sitting up. Reviewed importance of STS and I&O. Encouraged mom to hand express and spoon feed after BF to give baby more if needed. Monitor for s/sx of baby's glucose being low.  Encouraged mom to call for assistance or questions as needed. Mom stated she was good. Lactation brochure given.  Patient Name: Girl Shreya Lacasse [id number] Date: 02/17/2020 Reason for consult: Initial assessment;Term;Maternal endocrine disorder Type of Endocrine Disorder?: Diabetes   Maternal Data Has patient been taught Hand Expression?: Yes Does the patient have breastfeeding experience prior to this delivery?: Yes  Feeding Feeding Type: Breast Milk  LATCH Score       Type of Nipple: Everted at rest and after stimulation  Comfort (Breast/Nipple): Soft / non-tender        Interventions Interventions: Breast feeding basics reviewed;Expressed milk;Breast massage;Hand express;Breast compression  Lactation Tools Discussed/Used     Consult Status Consult Status: Follow-up Date: 02/18/20 Follow-up type: In-patient    Aunna Snooks, Diamond Nickel 02/17/2020, 1:05 AM

## 2020-02-17 NOTE — Progress Notes (Addendum)
Post Partum Day 1 Subjective: up ad lib, voiding, tolerating PO and cramping 4/10, but 6/10 with breastfeeding.  Lochia < menses.  Objective: Blood pressure 100/66, pulse 66, temperature 98 F (36.7 C), resp. rate 16, height 5\' 3"  (1.6 m), weight 85.5 kg, last menstrual period 05/19/2019, SpO2 100 %, unknown if currently breastfeeding.  Physical Exam:  General: alert and cooperative Lochia: appropriate Uterine Fundus: firm DVT Evaluation: No evidence of DVT seen on physical exam.  Recent Labs    02/16/20 1051  HGB 9.7*  HCT 31.2*    Assessment/Plan: Plan for discharge tomorrow, Breastfeeding and Contraception POPs.    #GDMA2/Class B: CBG pending.   LOS: 1 day   EMILY M GREEN 02/17/2020, 8:48 AM   GME ATTESTATION:  I saw and evaluated the patient. I agree with the findings and the plan of care as documented in the resident's note with addition of the following:  CBG 120 this am, will need 2 hour GTT at pp appointment. Otherwise doing well. Hgb 9.7 on admission, PO iron started; s/p feraheme  04/18/2020, DO OB Fellow, Faculty Practice [facility], Center for [facility] 02/17/2020 10:28 AM

## 2020-02-17 NOTE — Anesthesia Postprocedure Evaluation (Signed)
Anesthesia Post Note  Patient: Gina Chung  Procedure(s) Performed: AN AD HOC LABOR EPIDURAL     Patient location during evaluation: Mother Baby Anesthesia Type: Epidural Level of consciousness: awake and alert Pain management: pain level controlled Vital Signs Assessment: post-procedure vital signs reviewed and stable Respiratory status: spontaneous breathing, nonlabored ventilation and respiratory function stable Cardiovascular status: stable Postop Assessment: no headache, no backache and epidural receding Anesthetic complications: no    Last Vitals:  Vitals:   02/17/20 0000 02/17/20 0420  BP: 117/70 121/73  Pulse:  75  Resp: 16 18  Temp: 36.9 C 36.7 C  SpO2:  100%    Last Pain:  Vitals:   02/17/20 0420  TempSrc: Oral  PainSc: 6    Pain Goal:                   [facility]ica Records

## 2020-02-18 MED ORDER — OXYCODONE-ACETAMINOPHEN 5-325 MG PO TABS: 1.0000 | ORAL_TABLET | Freq: Four times a day (QID) | ORAL | 0 refills | Status: AC | PRN

## 2020-02-18 MED ORDER — NORETHINDRONE 0.35 MG PO TABS
1.0000 | ORAL_TABLET | Freq: Every day | ORAL | 11 refills | Status: DC
Start: 2020-03-10 — End: 2021-12-02

## 2020-02-18 MED ORDER — IBUPROFEN 600 MG PO TABS: 600.0000 mg | ORAL_TABLET | Freq: Three times a day (TID) | ORAL | 0 refills | Status: DC | PRN

## 2020-02-18 NOTE — Plan of Care (Signed)
  Problem: Education: Goal: Knowledge of condition will improve Outcome: Completed/Met   Problem: Activity: Goal: Will verbalize the importance of balancing activity with adequate rest periods Outcome: Completed/Met Goal: Ability to tolerate increased activity will improve Outcome: Completed/Met   Problem: Life Cycle: Goal: Chance of risk for complications during the postpartum period will decrease Outcome: Completed/Met   Problem: Activity: Goal: Risk for activity intolerance will decrease Outcome: Completed/Met

## 2020-02-18 NOTE — Lactation Note (Signed)
This note was copied from a baby's chart. Lactation Consultation Note  Patient Name: Gina Chung [id number] Date: 02/18/2020 Reason for consult: Follow-up assessment;Term Type of Endocrine Disorder?: Diabetes  0850 - 0454 - I conducted a discharge consult with Ms. Sellinger. Her daughter, Gina Chung, was in the bassinet loosely swaddled. As we were discussing her night she had a large emesis. I changed her blankets and tried to burp her. She then had a smaller emesis. I suggested that Ms. Peltz hold her upright for a few minutes to help her be a bit more comfortable.  Ms. Pita states that baby received 30 mls of formula prior to my arrival. We discussed baby's tummy size on day 2 and possibility that baby had more than necessary. Ms. Ardoin states that she breast fed all night, and that baby appeared to be still hungry this am. Thus she gave some formula. She has breast fed all of her previous four children, and she has also used formula with them, at times. Her goal is to primarily breast feed.  I educated on night two feeding patterns and cluster feeding. I also reviewed hand expression and noted copious colostrum. She has a manual pump at the bedside, but she has been loathe to use it because in the past, pumping gave her oversupply. I discussed how to manage engorgement and how to use the pump for comfort pumping purposes, if needed. I also suggested that in order to help her body made adequate volumes of milk that she put baby to breast before supplementation.  Discharge education conducted and breast feeding community resources reviewed.   PLAN: Breast feed on demand 8-12 times a day Supplement after, only as needed, in small increments working up until baby is content Comfort pump for engorgement and use EBM for supplement Pump as a replacement for breast feeding if baby gets a bottle in leiu of breast feeding  Maternal Data Has patient been taught Hand Expression?: Yes Does the patient have  breastfeeding experience prior to this delivery?: Yes  Feeding Feeding Type: Bottle Fed - Formula Nipple Type: Slow - flow  LATCH Score                   Interventions Interventions: Breast feeding basics reviewed;Hand express  Lactation Tools Discussed/Used Pump Review: Setup, frequency, and cleaning   Consult Status Consult Status: Complete Date: 02/18/20    Walker Shadow 02/18/2020, 9:22 AM

## 2020-02-18 NOTE — Plan of Care (Signed)
Problem: Clinical Measurements: Goal: Ability to maintain clinical measurements within normal limits will improve Outcome: Completed/Met Goal: Will remain free from infection Outcome: Completed/Met Goal: Diagnostic test results will improve Outcome: Completed/Met Goal: Respiratory complications will improve Outcome: Completed/Met Goal: Cardiovascular complication will be avoided Outcome: Completed/Met   Problem: Nutrition: Goal: Adequate nutrition will be maintained Outcome: Completed/Met   Problem: Elimination: Goal: Will not experience complications related to bowel motility Outcome: Completed/Met Goal: Will not experience complications related to urinary retention Outcome: Completed/Met   Problem: Pain Managment: Goal: General experience of comfort will improve Outcome: Completed/Met   Problem: Safety: Goal: Ability to remain free from injury will improve Outcome: Completed/Met   Problem: Skin Integrity: Goal: Risk for impaired skin integrity will decrease Outcome: Completed/Met   Problem: Education: Goal: Knowledge of condition will improve Outcome: Completed/Met   Problem: Activity: Goal: Will verbalize the importance of balancing activity with adequate rest periods Outcome: Completed/Met Goal: Ability to tolerate increased activity will improve Outcome: Completed/Met   Problem: Life Cycle: Goal: Chance of risk for complications during the postpartum period will decrease Outcome: Completed/Met   Problem: Role Relationship: Goal: Ability to demonstrate positive interaction with newborn will improve Outcome: Completed/Met   Problem: Skin Integrity: Goal: Demonstration of wound healing without infection will improve Outcome: Completed/Met   Problem: Education: Goal: Knowledge of General Education information will improve Description: Including pain rating scale, medication(s)/side effects and non-pharmacologic comfort measures Outcome: Completed/Met    Problem: Health Behavior/Discharge Planning: Goal: Ability to manage health-related needs will improve Outcome: Completed/Met   Problem: Activity: Goal: Risk for activity intolerance will decrease Outcome: Completed/Met   Problem: Nutrition: Goal: Adequate nutrition will be maintained Outcome: Completed/Met   Problem: Elimination: Goal: Will not experience complications related to bowel motility Outcome: Completed/Met Goal: Will not experience complications related to urinary retention Outcome: Completed/Met   Problem: Education: Goal: Knowledge of condition will improve Outcome: Completed/Met Goal: Individualized Educational Video(s) Outcome: Completed/Met   Problem: Clinical Measurements: Goal: Ability to maintain clinical measurements within normal limits will improve Outcome: Completed/Met Goal: Will remain free from infection Outcome: Completed/Met Goal: Diagnostic test results will improve Outcome: Completed/Met Goal: Respiratory complications will improve Outcome: Completed/Met Goal: Cardiovascular complication will be avoided Outcome: Completed/Met   Problem: Nutrition: Goal: Adequate nutrition will be maintained Outcome: Completed/Met   Problem: Elimination: Goal: Will not experience complications related to bowel motility Outcome: Completed/Met Goal: Will not experience complications related to urinary retention Outcome: Completed/Met   Problem: Pain Managment: Goal: General experience of comfort will improve Outcome: Completed/Met   Problem: Safety: Goal: Ability to remain free from injury will improve Outcome: Completed/Met   Problem: Skin Integrity: Goal: Risk for impaired skin integrity will decrease Outcome: Completed/Met   Problem: Education: Goal: Knowledge of condition will improve Outcome: Completed/Met   Problem: Activity: Goal: Will verbalize the importance of balancing activity with adequate rest periods Outcome:  Completed/Met Goal: Ability to tolerate increased activity will improve Outcome: Completed/Met   Problem: Life Cycle: Goal: Chance of risk for complications during the postpartum period will decrease Outcome: Completed/Met   Problem: Role Relationship: Goal: Ability to demonstrate positive interaction with newborn will improve Outcome: Completed/Met   Problem: Skin Integrity: Goal: Demonstration of wound healing without infection will improve Outcome: Completed/Met   Problem: Education: Goal: Knowledge of General Education information will improve Description: Including pain rating scale, medication(s)/side effects and non-pharmacologic comfort measures Outcome: Completed/Met   Problem: Health Behavior/Discharge Planning: Goal: Ability to manage health-related needs will improve Outcome: Completed/Met   Problem:  Activity: Goal: Risk for activity intolerance will decrease Outcome: Completed/Met   Problem: Nutrition: Goal: Adequate nutrition will be maintained Outcome: Completed/Met   Problem: Elimination: Goal: Will not experience complications related to bowel motility Outcome: Completed/Met Goal: Will not experience complications related to urinary retention Outcome: Completed/Met   Problem: Education: Goal: Knowledge of condition will improve Outcome: Completed/Met Goal: Individualized Educational Video(s) Outcome: Completed/Met

## 2020-02-18 NOTE — Plan of Care (Signed)
  Problem: Pain Managment: Goal: General experience of comfort will improve Outcome: Completed/Met   Problem: Safety: Goal: Ability to remain free from injury will improve Outcome: Completed/Met   Problem: Skin Integrity: Goal: Risk for impaired skin integrity will decrease Outcome: Completed/Met   Problem: Education: Goal: Knowledge of condition will improve Outcome: Completed/Met   Problem: Activity: Goal: Will verbalize the importance of balancing activity with adequate rest periods Outcome: Completed/Met Goal: Ability to tolerate increased activity will improve Outcome: Completed/Met   Problem: Life Cycle: Goal: Chance of risk for complications during the postpartum period will decrease Outcome: Completed/Met   Problem: Education: Goal: Knowledge of General Education information will improve Description: Including pain rating scale, medication(s)/side effects and non-pharmacologic comfort measures Outcome: Completed/Met   Problem: Activity: Goal: Risk for activity intolerance will decrease Outcome: Completed/Met

## 2020-02-21 ENCOUNTER — Ambulatory Visit: Payer: 59

## 2020-03-29 ENCOUNTER — Ambulatory Visit (HOSPITAL_BASED_OUTPATIENT_CLINIC_OR_DEPARTMENT_OTHER): Payer: Medicaid Other | Admitting: Obstetrics and Gynecology

## 2020-03-29 ENCOUNTER — Encounter: Payer: Self-pay | Admitting: Obstetrics and Gynecology

## 2020-03-29 ENCOUNTER — Other Ambulatory Visit: Payer: Self-pay

## 2020-03-29 ENCOUNTER — Other Ambulatory Visit: Payer: Medicaid Other

## 2020-03-29 DIAGNOSIS — O24439 Gestational diabetes mellitus in the puerperium, unspecified control: Secondary | ICD-10-CM

## 2020-03-29 NOTE — Progress Notes (Signed)
    Post Partum Visit Note  Gina Chung is a 44 y.o. [phone] female who presents for a postpartum visit. She is 6 weeks postpartum following a normal spontaneous vaginal delivery.  I have fully reviewed the prenatal and intrapartum course. The delivery was at 39 gestational weeks.  Anesthesia: epidural. Postpartum course has been doing well. Baby is doing well. Baby is feeding by both breast and bottle - Similac Sensitive RS. Bleeding thin lochia. Bowel function is normal. Bladder function is normal. Patient is not sexually active. Contraception method is oral progesterone-only contraceptive.  Postpartum depression screening: negative, score 0.      Review of Systems Pertinent items noted in HPI and remainder of comprehensive ROS otherwise negative.    Objective:  Blood pressure 121/84, pulse 69, weight 182 lb (82.6 kg), unknown if currently breastfeeding.  General:  alert, cooperative and no distress   Breasts:  inspection negative, no nipple discharge or bleeding, no masses or nodularity palpable  Lungs: clear to auscultation bilaterally  Heart:  regular rate and rhythm  Abdomen: soft, non-tender; bowel sounds normal; no masses,  no organomegaly   Vulva:  normal  Vagina: normal vagina, no discharge, exudate, lesion, or erythema        Assessment:    Normal postpartum exam. Pap smear not done at today's visit.   Plan:   Essential components of care per ACOG recommendations:  1.  Mood and well being: Patient with negative depression screening today. Reviewed local resources for support.  - Patient does not use tobacco.   2. Infant care and feeding:  -Patient currently breastmilk feeding? Yes Discussed return to work and pumping. Reviewed importance of draining breast regularly to support lactation. -Social determinants of health (SDOH) reviewed in EPIC. No concerns  3. Sexuality, contraception and birth spacing - Patient does not want a pregnancy in the next year.    -  Reviewed forms of contraception in tiered fashion. Patient desired oral progesterone-only contraceptive today.   - Discussed birth spacing of 18 months  4. Sleep and fatigue -Encouraged family/partner/community support of 4 hrs of uninterrupted sleep to help with mood and fatigue  5. Physical Recovery  - Discussed patients delivery and complications - Patient has urinary incontinence? No - Patient is safe to resume physical and sexual activity  6.  Health Maintenance - Last pap smear done 2020 and was normal with negative HPV. Mammogram needs to be scheduled  7. Patient with GDM - will return for glucola next week - Patient with previous endometrial polyp- pelvic ultrasound ordered  Catalina Antigua, MD Center for [facility], [facility]

## 2020-04-05 ENCOUNTER — Other Ambulatory Visit: Payer: Medicaid Other

## 2020-06-06 ENCOUNTER — Ambulatory Visit
Admission: RE | Admit: 2020-06-06 | Discharge: 2020-06-06 | Disposition: A | Discharge status: Home / Self Care | Payer: Medicaid Other | Source: Ambulatory Visit | Attending: Obstetrics and Gynecology | Admitting: Obstetrics and Gynecology

## 2020-06-06 ENCOUNTER — Other Ambulatory Visit: Payer: Self-pay

## 2021-09-14 NOTE — L&D Delivery Note (Addendum)
OB/GYN Faculty Practice Delivery Note  Gina Chung is a 46 y.o. [id number] s/p SVD at [redacted]w[redacted]d. She was admitted for IOL s/t AMA.   SROM: Around 1720 with clear fluid GBS Status:  Negative/-- (09/25 0958) Maximum Maternal Temperature:  Temp (24hrs), Avg:98.3 F (36.8 C), Min:98.2 F (36.8 C), Max:98.3 F (36.8 C)    Labor Progress: Patient arrived at 3cm cm dilation and was induced with cytotec.   Delivery Date/Time: 06/27/2022 at [place] Delivery: Called to room and patient was complete and pushing. Head delivered in ROA position. Loose nuchal cord x1 present and delivered through. Shoulder and body delivered in usual fashion. Reduced after delivery. Infant with spontaneous cry, placed on mother's abdomen, dried and stimulated. Cord clamped x 2 after 1-minute delay, and cut by FOB. Cord blood drawn. Placenta delivered spontaneously with gentle cord traction. Fundus firm with massage and Pitocin. Labia, perineum, vagina, and cervix inspected without any tears present.   Placenta: Delivered intact, assisted with ring forceps Complications: None Lacerations: None EBL: 47 cc Analgesia: Epidural    Infant: APGAR (1 MIN): 9   APGAR (5 MINS): 9   APGAR (10 MINS):    Weight: pending  Lowry Ram, MD  PGY-1, [facility]  06/27/2022 6:51 PM  GME ATTESTATION:  I was gloved and available in the room during the entire delivery. I agree with the findings and the plan of care as documented in the resident's note. I have made changes to documentation as necessary.  Gerlene Fee, DO OB Fellow, [place] [facility] [place] 06/27/2022, 7:12 PM

## 2021-11-05 ENCOUNTER — Ambulatory Visit (INDEPENDENT_AMBULATORY_CARE_PROVIDER_SITE_OTHER): Payer: Medicaid Other | Admitting: Advanced Practice Midwife

## 2021-11-05 ENCOUNTER — Other Ambulatory Visit: Payer: Self-pay

## 2021-11-05 VITALS — BP 121/84 | HR 81 | Wt 185.6 lb

## 2021-11-05 DIAGNOSIS — N911 Secondary amenorrhea: Secondary | ICD-10-CM

## 2021-11-05 DIAGNOSIS — Z3201 Encounter for pregnancy test, result positive: Secondary | ICD-10-CM | POA: Diagnosis not present

## 2021-11-05 DIAGNOSIS — R110 Nausea: Secondary | ICD-10-CM

## 2021-11-05 DIAGNOSIS — N926 Irregular menstruation, unspecified: Secondary | ICD-10-CM

## 2021-11-05 LAB — POCT URINE PREGNANCY: Preg Test, Ur: POSITIVE — AB

## 2021-11-05 MED ORDER — DOXYLAMINE-PYRIDOXINE 10-10 MG PO TBEC: DELAYED_RELEASE_TABLET | ORAL | 5 refills | Status: DC

## 2021-11-05 NOTE — Progress Notes (Signed)
° °  GYNECOLOGY PROGRESS NOTE  History:  46 y.o. [id number] presents to [facility] office today for problem gyn visit. She reports missed menses and faintly positive home pregnancy test this week.  LMP was 09/27/21 so EDD by LMP is 07/04/22, making pt [redacted]w[redacted]d today. She was using natural family planning and usually has regular menses.  Last menses was unusual however, expected on 09/14/21 but came 2 weeks late. She is concerned that ovulation occurred at an unexpected time since the period was unusual. She reports having some nausea the last 1-2 days without vomiting but has hx n/v with previous pregnancies.   She denies h/a, dizziness, shortness of breath, n/v, or fever/chills.    The following portions of the patient's history were reviewed and updated as appropriate: allergies, current medications, past family history, past medical history, past social history, past surgical history and problem list. Last pap smear on 01/03/2018 was normal, negative HRHPV.  Health Maintenance Due  Topic Date Due   COVID-19 Vaccine (1) Never done   FOOT EXAM  Never done   OPHTHALMOLOGY EXAM  Never done   URINE MICROALBUMIN  Never done   Hepatitis C Screening  Never done   HEMOGLOBIN A1C  08/17/2020   PAP SMEAR-Modifier  01/03/2021   INFLUENZA VACCINE  04/14/2021     Review of Systems:  Pertinent items are noted in HPI.   Objective:  Physical Exam Blood pressure 121/84, pulse 81, weight 185 lb 9.6 oz (84.2 kg), last menstrual period 09/27/2021, unknown if currently breastfeeding. VS reviewed, nursing note reviewed,  Constitutional: well developed, well nourished, no distress HEENT: normocephalic CV: normal rate Pulm/chest wall: normal effort Breast Exam: deferred Abdomen: soft Neuro: alert and oriented x 3 Skin: warm, dry Psych: affect normal Pelvic exam: Deferred  Assessment & Plan:  1. Irregular menses --Home UPT positive --UPT positive here in office today to confirm - POCT urine pregnancy  2.  Amenorrhea, secondary --Positive pregnancy test  3. Nausea --Will send Rx for antiemetics  4. Positive pregnancy test --Pt scheduled for dating [place] and intake visit and New OB visit --reviewed pt concerns about AMA over age 16 and pregnancy. Most pregnancy are normal at any age.  Discussed genetic testing options and antenatal testing for high risk pregnancy.  Pt states understanding and agrees with POC.    Sharen Counter, CNM 12:24 PM

## 2021-11-05 NOTE — Progress Notes (Signed)
Pt is in the office for GYN visit to discuss irregular cycles. LMP 09-27-21. Pt had faint positive UPT at home, will repeat today. UPT is positive in office today

## 2021-12-02 ENCOUNTER — Ambulatory Visit (INDEPENDENT_AMBULATORY_CARE_PROVIDER_SITE_OTHER): Payer: Medicaid Other

## 2021-12-02 ENCOUNTER — Other Ambulatory Visit: Payer: Self-pay

## 2021-12-02 VITALS — BP 131/82 | HR 86 | Ht 62.0 in | Wt 184.0 lb

## 2021-12-02 DIAGNOSIS — O09529 Supervision of elderly multigravida, unspecified trimester: Secondary | ICD-10-CM

## 2021-12-02 MED ORDER — CITRANATAL BLOOM 90-1 MG PO TABS: 1.0000 | ORAL_TABLET | Freq: Every day | ORAL | 11 refills | Status: DC

## 2021-12-02 NOTE — Progress Notes (Cosign Needed)
New OB Intake ? ?I connected with  Gina Chung on 12/02/21 at 10:15 AM EDT by in person and verified that I am speaking with the correct person using two identifiers. Nurse is located at [facility] and pt is located at [place]. ? ?I discussed the limitations, risks, security and privacy concerns of performing an evaluation and management service by telephone and the availability of in person appointments. I also discussed with the patient that there may be a patient responsible charge related to this service. The patient expressed understanding and agreed to proceed. ? ?I explained I am completing New OB Intake today. We discussed her EDD of 07/04/22 that is based on LMP of 09/27/21. Pt is [id number]/[id number]. I reviewed her allergies, medications, Medical/Surgical/OB history, and appropriate screenings. I informed her of [facility] services. Based on history, this is a/an  pregnancy uncomplicated .  ? ?Patient Active Problem List  ? Diagnosis Date Noted  ? Positive pregnancy test 11/05/2021  ? Nausea 11/05/2021  ? AMA (advanced maternal age) multigravida 35+ 09/03/2016  ? ? ?Concerns addressed today ? ?Delivery Plans:  ?Plans to deliver at [facility].  ? ?MyChart/Babyscripts ?MyChart access verified. I explained pt will have some visits in office and some virtually. Babyscripts instructions given and order placed. Patient verifies receipt of registration text/e-mail. Account successfully created and app downloaded. ? ?Blood Pressure Cuff  ?Patient has BP cuff at home from last pregnancy. Explained after first prenatal appt pt will check weekly and document in 31. ? ?Weight scale: Patient does have weight scale. ? ?Anatomy US ?Explained first scheduled [place] will be around 19 weeks. Dating and viability scan performed today. ? ?Labs ?Discussed Johnsie Cancel genetic screening with patient. Would like both Panorama and Horizon drawn at new OB visit.Also if interested in genetic testing, tell patient she will need AFP 15-21 weeks to  complete genetic testing .Routine prenatal labs needed. ? ?Covid Vaccine ?Patient has covid vaccine.  ? ? ?Informed patient of [facility] website  and placed link in her AVS.  ? ?Social Determinants of Health ?Food Insecurity: Patient denies food insecurity. ?WIC Referral: Patient is interested in referral to [facility]  ?Transportation: Patient denies transportation needs. ?Childcare: Discussed no children allowed at ultrasound appointments. Offered childcare services; patient declines childcare services at this time. ? ?Send link to Pregnancy Navigators ? ? ?Placed OB Box on problem list and updated ? ?First visit review ?I reviewed new OB appt with pt. I explained she will have a pelvic exam, ob bloodwork with genetic screening, and PAP smear. Explained pt will be seen by Gaylan Gerold at first visit; encounter routed to appropriate provider. Explained that patient will be seen by pregnancy navigator following visit with provider. [facility] information placed in AVS.  ? ?Lucianne Lei, RN ?12/02/2021  10:16 AM  ?

## 2021-12-02 NOTE — Progress Notes (Signed)
Patient was assessed and managed by nursing staff during this encounter. I have reviewed the chart and agree with the documentation and plan. I have also made any necessary editorial changes. ? ?Warden Fillers, MD ?12/02/2021 1:20 PM   ?

## 2021-12-24 ENCOUNTER — Other Ambulatory Visit (HOSPITAL_COMMUNITY)
Admission: RE | Admit: 2021-12-24 | Discharge: 2021-12-24 | Disposition: A | Discharge status: Home / Self Care | Payer: Medicaid Other | Source: Ambulatory Visit | Attending: Certified Nurse Midwife | Admitting: Certified Nurse Midwife

## 2021-12-24 ENCOUNTER — Ambulatory Visit (INDEPENDENT_AMBULATORY_CARE_PROVIDER_SITE_OTHER): Payer: Medicaid Other | Admitting: Certified Nurse Midwife

## 2021-12-24 VITALS — BP 129/79 | HR 90 | Wt 184.7 lb

## 2021-12-24 DIAGNOSIS — O219 Vomiting of pregnancy, unspecified: Secondary | ICD-10-CM

## 2021-12-24 DIAGNOSIS — K029 Dental caries, unspecified: Secondary | ICD-10-CM

## 2021-12-24 DIAGNOSIS — Z8632 Personal history of gestational diabetes: Secondary | ICD-10-CM

## 2021-12-24 DIAGNOSIS — Z3491 Encounter for supervision of normal pregnancy, unspecified, first trimester: Secondary | ICD-10-CM | POA: Diagnosis present

## 2021-12-24 DIAGNOSIS — Z3A12 12 weeks gestation of pregnancy: Secondary | ICD-10-CM | POA: Diagnosis not present

## 2021-12-24 DIAGNOSIS — O09529 Supervision of elderly multigravida, unspecified trimester: Secondary | ICD-10-CM

## 2021-12-24 MED ORDER — PRENATAL PLUS VITAMIN/MINERAL 27-1 MG PO TABS: 1.0000 | ORAL_TABLET | Freq: Every day | ORAL | 11 refills | Status: DC

## 2021-12-24 NOTE — Progress Notes (Signed)
? ?History:  ? Gina Chung is a 46 y.o. [id number] at [redacted]w[redacted]d by LMP being seen today for her first obstetrical visit.  Her obstetrical history is significant for advanced maternal age, pregnancy induced hypertension, pre-eclampsia, and gestational diabetes . Patient does intend to breast feed. Pregnancy history fully reviewed. ? ?Patient reports nausea, need for a dental note to get a cavity filled and wants a new prescription for PNV because the current one is not covered and costs $160.  ?  ?HISTORY: ?OB History  ?Gravida Para Term Preterm AB Living  ?8 5 5  0 2 5  ?SAB IAB Ectopic Multiple Live Births  ?2 0 0 0 5  ?  ?# Outcome Date GA Lbr Len/2nd Weight Sex Delivery Anes PTL Lv  ?8 Current           ?7 Term 02/16/20 [redacted]w[redacted]d 04:54 / 00:05 7 lb 2.1 oz (3.235 kg) F Vag-Spont EPI  LIV  ?   Name: Gina Chung  ?   Apgar1: 9  Apgar5: 9  ?6 SAB 09/10/16 [redacted]w[redacted]d    SAB     ?5 Term 06/08/13 [redacted]w[redacted]d 19:28 / 00:20 7 lb 13.2 oz (3.549 kg) M Vag-Spont EPI  LIV  ?   Name: Gina Chung  ?   Apgar1: 8  Apgar5: 9  ?4 Term 12/31/11 [redacted]w[redacted]d 05:30 / 00:07 7 lb 15.3 oz (3.61 kg) F Vag-Spont EPI  LIV  ?   Birth Comments: wnl  ?   Name: Gina Chung  ?   Apgar1: 8  Apgar5: 8  ?3 SAB 2010          ?2 Term 2008 [redacted]w[redacted]d 02:00 7 lb 1 oz (3.204 kg) F Vag-Spont EPI  LIV  ?1 Term 2006 [redacted]w[redacted]d 12:00 7 lb 6 oz (3.345 kg) M Vag-Spont EPI  LIV  ?  ?Last pap smear was done 2021 and was normal ? ?Past Medical History:  ?Diagnosis Date  ? Anemia   ? Diabetes mellitus without complication (HCC)   ? Gestational diabetes   ? History of UTI   ? Language barrier   ? arabic and english  ? Migraine   ? Vaginal Pap smear, abnormal   ? ?Past Surgical History:  ?Procedure Laterality Date  ? COLPOSCOPY    ? DILATION AND EVACUATION N/A 09/11/2016  ? Procedure: DILATATION AND EVACUATION;  Surgeon: 09/13/2016, MD;  Location: [facility];  Service: Gynecology;  Laterality: N/A;  ? ?Family History  ?Problem Relation Age of Onset  ? Rheum arthritis Mother   ?  Hypertension Mother   ? Hypertension Father   ? Rheum arthritis Maternal Grandmother   ? Diabetes Maternal Grandfather   ? ?Social History  ? ?Tobacco Use  ? Smoking status: Never  ? Smokeless tobacco: Never  ?Vaping Use  ? Vaping Use: Never used  ?Substance Use Topics  ? Alcohol use: No  ? Drug use: No  ? ?No Known Allergies ?Current Outpatient Medications on File Prior to Visit  ?Medication Sig Dispense Refill  ? Doxylamine-Pyridoxine (DICLEGIS) 10-10 MG TBEC Take 2 tabs at bedtime. If needed, add another tab in the morning. If needed, add another tab in the afternoon, up to 4 tabs/day. 100 tablet 5  ? ?Current Facility-Administered Medications on File Prior to Visit  ?Medication Dose Route Frequency Provider Last Rate Last Admin  ? misoprostol (CYTOTEC) tablet 200 mcg  200 mcg Rectal Once [name], MD      ? ? ?Review of Systems ?Pertinent  items noted in HPI and remainder of comprehensive ROS otherwise negative. ?Physical Exam:  ? ?Vitals:  ? 12/24/21 1532  ?BP: 129/79  ?Pulse: 90  ?Weight: 184 lb 11.2 oz (83.8 kg)  ? ?Fetal Heart Rate (bpm): 165 ? ?Constitutional: Well-developed, well-nourished pregnant female in no acute distress.  ?HEENT: PERRLA ?Skin: normal color and turgor, no rash ?Cardiovascular: normal rate & rhythm ?Respiratory: normal effort ?GI: Abd soft, non-tender ?MS: Extremities nontender, no edema, normal ROM ?Neurologic: Alert and oriented x 4.  ?GU: no CVA tenderness ?Pelvic: exam deferred ? ?Assessment & Plan:  ?1. Supervision of low-risk pregnancy, first trimester ?- Doing well overall, not feeling fetal movement ?- CBC/D/Plt+RPR+Rh+ABO+RubIgG... ?- Culture, OB Urine ?- Genetic Screening ?- GC/Chlamydia probe amp ([place])not at [facility] ?- Prenatal Vit-Fe Fumarate-FA (PRENATAL PLUS VITAMIN/MINERAL) 27-1 MG TABS; Take 1 tablet by mouth daily after breakfast.  Dispense: 30 tablet; Refill: 11 ? ?2. [redacted] weeks gestation of pregnancy ?- Routine OB care  ? ?3. Nausea and vomiting during  pregnancy ?- Is taking diclegis which is working well, has refills available. ? ?4. History of gestational diabetes ?- Hemoglobin A1c ? ?5. Dental caries ?- Dental note given ? ?6. Initial obstetric visit in first trimester ?- Initial labs drawn. ?- New script for PNV covered by her insurance sent. ?- Problem list reviewed and updated. ?- Genetic Screening discussed, First trimester screen, Quad screen, and NIPS: ordered. ?- Ultrasound discussed; fetal anatomic survey: ordered. ?- Anticipatory guidance about prenatal visits given including labs, ultrasounds, and testing. ?- Discussed usage of Babyscripts and virtual visits as additional source of managing and completing prenatal visits in midst of coronavirus and pandemic.   ?- Encouraged to complete MyChart Registration for her ability to review results, send requests, and have questions addressed.  ?- The nature of [place] [facility] for [facility] Healthcare/Faculty Practice with multiple MDs and Advanced Practice Providers was explained to patient; also emphasized that residents, students are part of our team. ?- Routine obstetric precautions reviewed. Encouraged to seek out care at office or emergency room [facility] preferred) for urgent and/or emergent concerns. ?Return in about 3 weeks (around 01/14/2022) for IN-PERSON, LOB.  ?  ? ?Edd Arbour, MSN, CNM, IBCLC ?Certified Nurse Midwife, [facility] ? ?

## 2021-12-25 ENCOUNTER — Telehealth: Payer: Self-pay | Admitting: *Deleted

## 2021-12-25 ENCOUNTER — Encounter: Payer: Self-pay | Admitting: *Deleted

## 2021-12-25 LAB — CBC/D/PLT+RPR+RH+ABO+RUBIGG...
Antibody Screen: NEGATIVE
Basophils Absolute: 0 10*3/uL (ref 0.0–0.2)
Basos: 0 %
EOS (ABSOLUTE): 0.1 10*3/uL (ref 0.0–0.4)
Eos: 2 %
HCV Ab: NONREACTIVE
HIV Screen 4th Generation wRfx: NONREACTIVE
Hematocrit: 32.9 % — ABNORMAL LOW (ref 34.0–46.6)
Hemoglobin: 10.5 g/dL — ABNORMAL LOW (ref 11.1–15.9)
Hepatitis B Surface Ag: NEGATIVE
Immature Grans (Abs): 0 10*3/uL (ref 0.0–0.1)
Immature Granulocytes: 0 %
Lymphocytes Absolute: 2.6 10*3/uL (ref 0.7–3.1)
Lymphs: 45 %
MCH: 26.7 pg (ref 26.6–33.0)
MCHC: 31.9 g/dL (ref 31.5–35.7)
MCV: 84 fL (ref 79–97)
Monocytes Absolute: 0.4 10*3/uL (ref 0.1–0.9)
Monocytes: 6 %
Neutrophils Absolute: 2.7 10*3/uL (ref 1.4–7.0)
Neutrophils: 47 %
Platelets: 369 10*3/uL (ref 150–450)
RBC: 3.93 x10E6/uL (ref 3.77–5.28)
RDW: 14.3 % (ref 11.7–15.4)
RPR Ser Ql: NONREACTIVE
Rh Factor: POSITIVE
Rubella Antibodies, IGG: 7.7 index (ref >0.99)
WBC: 5.7 10*3/uL (ref 3.4–10.8)

## 2021-12-25 LAB — HEMOGLOBIN A1C
Est. average glucose Bld gHb Est-mCnc: 123 mg/dL
Hgb A1c MFr Bld: 5.9 % — ABNORMAL HIGH (ref 4.8–5.6)

## 2021-12-25 LAB — GC/CHLAMYDIA PROBE AMP (~~LOC~~) NOT AT ARMC
Chlamydia: NEGATIVE
Comment: NEGATIVE
Comment: NORMAL
Neisseria Gonorrhea: NEGATIVE

## 2021-12-25 LAB — HCV INTERPRETATION

## 2021-12-25 NOTE — Telephone Encounter (Signed)
TC to notify of elevated Hgb A1C and need for early GTT. No answer. Left HIPPA compliant VM. Message sent via MyChart as well. ?

## 2021-12-26 LAB — URINE CULTURE, OB REFLEX

## 2021-12-26 LAB — CULTURE, OB URINE

## 2022-01-01 ENCOUNTER — Encounter: Payer: Self-pay | Admitting: Obstetrics

## 2022-01-02 ENCOUNTER — Encounter: Payer: Self-pay | Admitting: Obstetrics

## 2022-01-14 ENCOUNTER — Ambulatory Visit (INDEPENDENT_AMBULATORY_CARE_PROVIDER_SITE_OTHER): Payer: Medicaid Other | Admitting: Medical

## 2022-01-14 ENCOUNTER — Other Ambulatory Visit: Payer: Medicaid Other

## 2022-01-14 VITALS — BP 115/75 | HR 76 | Wt 180.0 lb

## 2022-01-14 DIAGNOSIS — Z3A19 19 weeks gestation of pregnancy: Secondary | ICD-10-CM

## 2022-01-14 DIAGNOSIS — O09522 Supervision of elderly multigravida, second trimester: Secondary | ICD-10-CM

## 2022-01-14 DIAGNOSIS — Z3A15 15 weeks gestation of pregnancy: Secondary | ICD-10-CM

## 2022-01-14 DIAGNOSIS — O09529 Supervision of elderly multigravida, unspecified trimester: Secondary | ICD-10-CM

## 2022-01-14 NOTE — Progress Notes (Signed)
? ?  PRENATAL VISIT NOTE ? ?Subjective:  ?Gina Chung is a 46 y.o. [id number] at [redacted]w[redacted]d being seen today for ongoing prenatal care.  She is currently monitored for the following issues for this high-risk pregnancy and has AMA (advanced maternal age) multigravida 87+; Positive pregnancy test; and Encounter for supervision of high-risk pregnancy with multigravida of advanced maternal age on their problem list. ? ?Patient reports no complaints.Nausea is improved. Contractions: Not present. Vag. Bleeding: None.   . Denies leaking of fluid.  ? ?The following portions of the patient's history were reviewed and updated as appropriate: allergies, current medications, past family history, past medical history, past social history, past surgical history and problem list.  ? ?Objective:  ? ?Vitals:  ? 01/14/22 0915  ?BP: 115/75  ?Pulse: 76  ?Weight: 180 lb (81.6 kg)  ? ? ?Fetal Status: Fetal Heart Rate (bpm): 154        ? ?General:  Alert, oriented and cooperative. Patient is in no acute distress.  ?Skin: Skin is warm and dry. No rash noted.   ?Cardiovascular: Normal heart rate noted  ?Respiratory: Normal respiratory effort, no problems with respiration noted  ?Abdomen: Soft, gravid, appropriate for gestational age.  Pain/Pressure: Absent     ?Pelvic: Cervical exam deferred        ?Extremities: Normal range of motion.  Edema: None  ?Mental Status: Normal mood and affect. Normal behavior. Normal judgment and thought content.  ? ?Assessment and Plan:  ?Pregnancy: [id number] at [redacted]w[redacted]d ?1. Encounter for supervision of high-risk pregnancy with multigravida of advanced maternal age ?- AFP, Serum, Open Spina Bifida ?- [place] MFM OB DETAIL +14 WK; Future ?- Nausea is improved ?- Still unsure about MOC  ?- [place] for anatomy ordered  ? ?2. Multigravida of advanced maternal age in second trimester ?- [place] MFM OB DETAIL +14 WK; Future ?- Discussed antenatal testing recommendation and delivery timing  ? ?3. [redacted] weeks gestation of pregnancy ? ? ?Preterm  labor symptoms and general obstetric precautions including but not limited to vaginal bleeding, contractions, leaking of fluid and fetal movement were reviewed in detail with the patient. ?Please refer to After Visit Summary for other counseling recommendations.  ? ?Return in about 4 weeks (around 02/11/2022) for LOB, any provider. ? ?No future appointments. ? ?Kerry Hough, PA-C ? ?

## 2022-01-16 LAB — AFP, SERUM, OPEN SPINA BIFIDA
AFP MoM: 0.89
AFP Value: 26.3 ng/mL
Gest. Age on Collection Date: 15 weeks
Maternal Age At EDD: 44.2 yr
OSBR Risk 1 IN: 10000
Test Results:: NEGATIVE
Weight: 180 [lb_av]

## 2022-01-28 ENCOUNTER — Other Ambulatory Visit: Payer: Medicaid Other

## 2022-01-28 DIAGNOSIS — O09529 Supervision of elderly multigravida, unspecified trimester: Secondary | ICD-10-CM

## 2022-01-29 LAB — GLUCOSE TOLERANCE, 2 HOURS W/ 1HR
Glucose, 1 hour: 167 mg/dL (ref 70–179)
Glucose, 2 hour: 122 mg/dL (ref 70–152)
Glucose, Fasting: 86 mg/dL (ref 70–91)

## 2022-02-06 ENCOUNTER — Ambulatory Visit: Payer: Medicaid Other | Attending: Medical

## 2022-02-06 ENCOUNTER — Ambulatory Visit: Payer: Medicaid Other | Admitting: *Deleted

## 2022-02-06 ENCOUNTER — Ambulatory Visit: Payer: Medicaid Other | Attending: Obstetrics | Admitting: Obstetrics

## 2022-02-06 ENCOUNTER — Other Ambulatory Visit: Payer: Self-pay | Admitting: *Deleted

## 2022-02-06 VITALS — BP 117/63 | HR 79

## 2022-02-06 DIAGNOSIS — O09522 Supervision of elderly multigravida, second trimester: Secondary | ICD-10-CM

## 2022-02-06 DIAGNOSIS — Z3A18 18 weeks gestation of pregnancy: Secondary | ICD-10-CM

## 2022-02-06 DIAGNOSIS — O09529 Supervision of elderly multigravida, unspecified trimester: Secondary | ICD-10-CM | POA: Insufficient documentation

## 2022-02-06 DIAGNOSIS — Z3A19 19 weeks gestation of pregnancy: Secondary | ICD-10-CM | POA: Diagnosis present

## 2022-02-06 DIAGNOSIS — O99212 Obesity complicating pregnancy, second trimester: Secondary | ICD-10-CM

## 2022-02-06 DIAGNOSIS — Z362 Encounter for other antenatal screening follow-up: Secondary | ICD-10-CM

## 2022-02-06 NOTE — Progress Notes (Signed)
MFM Note  Gina Chung was seen for a detailed fetal anatomy scan due to advanced maternal age (46 years old).   She denies any significant past medical history and denies any problems in her current pregnancy.    She had a cell free DNA test earlier in her pregnancy which indicated a low risk for trisomy 72, 15, and 13. A female fetus is predicted.   She was informed that the fetal growth and amniotic fluid level were appropriate for her gestational age.   There were no obvious fetal anomalies noted on today's ultrasound exam.  However, the views of the fetal anatomy were limited today due to the fetal position.  The patient was informed that anomalies may be missed due to technical limitations. If the fetus is in a suboptimal position or maternal habitus is increased, visualization of the fetus in the maternal uterus may be impaired.  The increased risk of fetal aneuploidy due to advanced maternal age was discussed.   Due to advanced maternal age, the patient was offered and declined an amniocentesis today for definitive diagnosis of fetal aneuploidy.  She is comfortable with her negative cell free DNA test.  As she is over the age of 15, we will continue to follow her with monthly growth ultrasounds throughout her pregnancy.    Weekly fetal testing should be started at 34 weeks.  As women over the age of 40 may be at increased risk for stillbirth, delivery is recommended at around 7 weeks.  A follow-up exam was scheduled in 4 weeks to complete the views of the fetal anatomy and to assess the fetal growth.    The patient stated that all of her questions have been answered today.  A total of 20 minutes was spent counseling and coordinating the care for this patient.  Greater than 50% of the time was spent in direct face-to-face contact.

## 2022-02-11 ENCOUNTER — Ambulatory Visit (INDEPENDENT_AMBULATORY_CARE_PROVIDER_SITE_OTHER): Payer: Medicaid Other | Admitting: Women's Health

## 2022-02-11 VITALS — BP 130/84 | HR 80 | Wt 185.0 lb

## 2022-02-11 DIAGNOSIS — Z3A19 19 weeks gestation of pregnancy: Secondary | ICD-10-CM

## 2022-02-11 DIAGNOSIS — N8500 Endometrial hyperplasia, unspecified: Secondary | ICD-10-CM

## 2022-02-11 DIAGNOSIS — O09522 Supervision of elderly multigravida, second trimester: Secondary | ICD-10-CM

## 2022-02-11 DIAGNOSIS — O09529 Supervision of elderly multigravida, unspecified trimester: Secondary | ICD-10-CM

## 2022-02-11 NOTE — Progress Notes (Unsigned)
Subjective:  Gina Chung is a 46 y.o. [id number] at 48w4dbeing seen today for ongoing prenatal care.  She is currently monitored for the following issues for this high-risk pregnancy and has AMA (advanced maternal age) multigravida 368+ Positive pregnancy test; and Encounter for supervision of high-risk pregnancy with multigravida of advanced maternal age on their problem list.  Patient reports no complaints.  Contractions: Not present. Vag. Bleeding: None.  Movement: Present. Denies leaking of fluid.   The following portions of the patient's history were reviewed and updated as appropriate: allergies, current medications, past family history, past medical history, past social history, past surgical history and problem list. Problem list updated.  Objective:   Vitals:   02/11/22 0838  BP: 130/84  Pulse: 80  Weight: 185 lb (83.9 kg)    Fetal Status: Fetal Heart Rate (bpm): 152   Movement: Present     General:  Alert, oriented and cooperative. Patient is in no acute distress.  Skin: Skin is warm and dry. No rash noted.   Cardiovascular: Normal heart rate noted  Respiratory: Normal respiratory effort, no problems with respiration noted  Abdomen: Soft, gravid, appropriate for gestational age. Pain/Pressure: Absent     Pelvic: Vag. Bleeding: None     Cervical exam deferred        Extremities: Normal range of motion.  Edema: None  Mental Status: Normal mood and affect. Normal behavior. Normal judgment and thought content.   Urinalysis:      Assessment and Plan:  Pregnancy: [id number] 135w4d1. Encounter for supervision of high-risk pregnancy with multigravida of advanced maternal age -contraception info given     12/02/2021   10:34 AM 09/16/2016    2:53 PM 09/03/2015    8:49 AM  PHQ9 SCORE ONLY  PHQ-9 Total Score 1 0 0      12/02/2021   10:35 AM  GAD 7 : Generalized Anxiety Score  Nervous, Anxious, on Edge 0  Control/stop worrying 0  Worry too much - different things 0   Trouble relaxing 0  Restless 0  Easily annoyed or irritable 0  Afraid - awful might happen 0  Total GAD 7 Score 0   2. [redacted] weeks gestation of pregnancy  3. Multigravida of advanced maternal age in second trimester - PEC baseline labs today - Comp Met (CMET) - Protein / creatinine ratio, urine   Preterm labor symptoms and general obstetric precautions including but not limited to vaginal bleeding, contractions, leaking of fluid and fetal movement were reviewed in detail with the patient. I discussed the assessment and treatment plan with the patient. The patient was provided an opportunity to ask questions and all were answered. The patient agreed with the plan and demonstrated an understanding of the instructions. The patient was advised to call back or seek an in-person office evaluation[facility] concerning [facility]. Please refer to After Visit Summary for other counseling recommendations.  Return in about 4 weeks (around 03/11/2022) for in-person LOB/APP OK.   [name], NiGerrie NordmannNP

## 2022-02-11 NOTE — Patient Instructions (Addendum)
[email or web address]     Maternity Assessment Unit (MAU)  The Maternity Assessment Unit (MAU) is located at the [facility] at [facility]. The address is: [address] [place], [place], [place] [address]. Please see map below for additional directions.    The Maternity Assessment Unit is designed to help you during your pregnancy, and for up to 6 weeks after delivery, with any pregnancy- or postpartum-related emergencies, if you think you are in labor, or if your water has broken. For example, if you experience nausea and vomiting, vaginal bleeding, severe abdominal or pelvic pain, elevated blood pressure or other problems related to your pregnancy or postpartum time, please come to the Maternity Assessment Unit for assistance.

## 2022-02-11 NOTE — Progress Notes (Signed)
ROB, reports no complaints today. 

## 2022-02-12 LAB — COMPREHENSIVE METABOLIC PANEL
ALT: 11 IU/L (ref 0–32)
AST: 12 IU/L (ref 0–40)
Albumin/Globulin Ratio: 1.3 (ref 1.2–2.2)
Albumin: 3.7 g/dL — ABNORMAL LOW (ref 3.8–4.8)
Alkaline Phosphatase: 80 IU/L (ref 44–121)
BUN/Creatinine Ratio: 8 — ABNORMAL LOW (ref 9–23)
BUN: 4 mg/dL — ABNORMAL LOW (ref 6–24)
Bilirubin Total: <0.2 mg/dL (ref 0.0–1.2)
CO2: 19 mmol/L — ABNORMAL LOW (ref 20–29)
Calcium: 9.6 mg/dL (ref 8.7–10.2)
Chloride: 101 mmol/L (ref 96–106)
Creatinine, Ser: 0.49 mg/dL — ABNORMAL LOW (ref 0.57–1.00)
Globulin, Total: 2.8 g/dL (ref 1.5–4.5)
Glucose: 85 mg/dL (ref 70–99)
Potassium: 4.2 mmol/L (ref 3.5–5.2)
Sodium: 133 mmol/L — ABNORMAL LOW (ref 134–144)
Total Protein: 6.5 g/dL (ref 6.0–8.5)
eGFR: 120 mL/min/{1.73_m2} (ref >59)

## 2022-02-13 LAB — PROTEIN / CREATININE RATIO, URINE
Creatinine, Urine: 50.4 mg/dL
Protein, Ur: 5.8 mg/dL
Protein/Creat Ratio: 115 mg/g creat (ref 0–200)

## 2022-03-06 ENCOUNTER — Encounter: Payer: Self-pay | Admitting: *Deleted

## 2022-03-06 ENCOUNTER — Ambulatory Visit: Payer: Medicaid Other | Admitting: *Deleted

## 2022-03-06 ENCOUNTER — Other Ambulatory Visit: Payer: Self-pay | Admitting: *Deleted

## 2022-03-06 ENCOUNTER — Ambulatory Visit: Payer: Medicaid Other | Attending: Obstetrics

## 2022-03-06 VITALS — BP 122/62 | HR 90

## 2022-03-06 DIAGNOSIS — O09529 Supervision of elderly multigravida, unspecified trimester: Secondary | ICD-10-CM | POA: Insufficient documentation

## 2022-03-06 DIAGNOSIS — O09522 Supervision of elderly multigravida, second trimester: Secondary | ICD-10-CM | POA: Insufficient documentation

## 2022-03-06 DIAGNOSIS — O99212 Obesity complicating pregnancy, second trimester: Secondary | ICD-10-CM | POA: Diagnosis not present

## 2022-03-06 DIAGNOSIS — Z362 Encounter for other antenatal screening follow-up: Secondary | ICD-10-CM | POA: Diagnosis present

## 2022-03-06 DIAGNOSIS — Z3A22 22 weeks gestation of pregnancy: Secondary | ICD-10-CM

## 2022-03-06 DIAGNOSIS — E669 Obesity, unspecified: Secondary | ICD-10-CM

## 2022-03-06 DIAGNOSIS — Z6833 Body mass index (BMI) 33.0-33.9, adult: Secondary | ICD-10-CM

## 2022-03-11 ENCOUNTER — Encounter: Payer: Medicaid Other | Admitting: Student

## 2022-04-27 ENCOUNTER — Ambulatory Visit (INDEPENDENT_AMBULATORY_CARE_PROVIDER_SITE_OTHER): Payer: Medicaid Other | Admitting: Advanced Practice Midwife

## 2022-04-27 VITALS — BP 141/79 | HR 112 | Wt 188.6 lb

## 2022-04-27 DIAGNOSIS — O26893 Other specified pregnancy related conditions, third trimester: Secondary | ICD-10-CM

## 2022-04-27 DIAGNOSIS — K30 Functional dyspepsia: Secondary | ICD-10-CM

## 2022-04-27 DIAGNOSIS — R030 Elevated blood-pressure reading, without diagnosis of hypertension: Secondary | ICD-10-CM

## 2022-04-27 DIAGNOSIS — O09529 Supervision of elderly multigravida, unspecified trimester: Secondary | ICD-10-CM

## 2022-04-27 DIAGNOSIS — O99013 Anemia complicating pregnancy, third trimester: Secondary | ICD-10-CM

## 2022-04-27 DIAGNOSIS — O09523 Supervision of elderly multigravida, third trimester: Secondary | ICD-10-CM

## 2022-04-27 DIAGNOSIS — Z3A30 30 weeks gestation of pregnancy: Secondary | ICD-10-CM

## 2022-04-27 DIAGNOSIS — R12 Heartburn: Secondary | ICD-10-CM

## 2022-04-27 MED ORDER — PANTOPRAZOLE SODIUM 40 MG PO TBEC
40.0000 mg | DELAYED_RELEASE_TABLET | Freq: Every day | ORAL | 2 refills | Status: DC
Start: 2022-04-27 — End: 2022-09-09

## 2022-04-27 NOTE — Progress Notes (Signed)
Pt reports fetal movement with contractions occasionally. Pt reports that she has been taking TUMS to help with heart burn and indigestion but it is not helping.

## 2022-04-27 NOTE — Progress Notes (Signed)
   PRENATAL VISIT NOTE  Subjective:  Gina Chung is a 46 y.o. [id number] at [redacted]w[redacted]d being seen today for ongoing prenatal care.  She is currently monitored for the following issues for this high-risk pregnancy and has AMA (advanced maternal age) multigravida 56+; Positive pregnancy test; Encounter for supervision of high-risk pregnancy with multigravida of advanced maternal age; and Endometrial hyperplasia on their problem list.  Patient reports no complaints.  Contractions: Irritability. Vag. Bleeding: None.  Movement: Present. Denies leaking of fluid.   The following portions of the patient's history were reviewed and updated as appropriate: allergies, current medications, past family history, past medical history, past social history, past surgical history and problem list.   Objective:   Vitals:   04/27/22 0947 04/27/22 0952  BP: (!) 145/79 (!) 141/79  Pulse: (!) 118 (!) 112  Weight: 188 lb 9.6 oz (85.5 kg)     Fetal Status: Fetal Heart Rate (bpm): 142   Movement: Present     General:  Alert, oriented and cooperative. Patient is in no acute distress.  Skin: Skin is warm and dry. No rash noted.   Cardiovascular: Normal heart rate noted  Respiratory: Normal respiratory effort, no problems with respiration noted  Abdomen: Soft, gravid, appropriate for gestational age.  Pain/Pressure: Present     Pelvic: Cervical exam deferred        Extremities: Normal range of motion.  Edema: None  Mental Status: Normal mood and affect. Normal behavior. Normal judgment and thought content.   Assessment and Plan:  Pregnancy: [id number] at [redacted]w[redacted]d 1. Encounter for supervision of high-risk pregnancy with multigravida of advanced maternal age --Anticipatory guidance about next visits/weeks of pregnancy given.   2. [redacted] weeks gestation of pregnancy   3. Elevated blood pressure reading without diagnosis of hypertension --No s/sx of PEC, no HTN prior to today's visit --Labs and BP check in 2-3 days, GHTN dx  if still elevated --PEC precautions/reasons to seek care reviewed  - CBC - Comp Met (CMET) - Protein / creatinine ratio, urine  4. Heartburn during pregnancy in third trimester  - pantoprazole (PROTONIX) 40 MG tablet; Take 1 tablet (40 mg total) by mouth daily.  Dispense: 30 tablet; Refill: 2  5. Acid indigestion --Pt using tums without improvement, indigestion is severe, keeping her awake at night  - pantoprazole (PROTONIX) 40 MG tablet; Take 1 tablet (40 mg total) by mouth daily.  Dispense: 30 tablet; Refill: 2   Preterm labor symptoms and general obstetric precautions including but not limited to vaginal bleeding, contractions, leaking of fluid and fetal movement were reviewed in detail with the patient. Please refer to After Visit Summary for other counseling recommendations.   Return in about 2 weeks (around 05/11/2022) for BP check this week with RN, then routine visit in 2 weeks, Female provider preferred, HROB.  Future Appointments  Date Time Provider [place]  05/11/2022  8:55 AM Constant, Vickii Chafe, MD [place] None  05/15/2022  8:30 AM [facility] NURSE [facility]  05/15/2022  8:45 AM [facility] US4 [facility] [place]    Fatima Blank, CNM

## 2022-04-28 LAB — COMPREHENSIVE METABOLIC PANEL
ALT: 7 IU/L (ref 0–32)
AST: 12 IU/L (ref 0–40)
Albumin/Globulin Ratio: 1.2 (ref 1.2–2.2)
Albumin: 3.5 g/dL — ABNORMAL LOW (ref 3.9–4.9)
Alkaline Phosphatase: 112 IU/L (ref 44–121)
BUN/Creatinine Ratio: 11 (ref 9–23)
BUN: 5 mg/dL — ABNORMAL LOW (ref 6–24)
Bilirubin Total: <0.2 mg/dL (ref 0.0–1.2)
CO2: 18 mmol/L — ABNORMAL LOW (ref 20–29)
Calcium: 8.5 mg/dL — ABNORMAL LOW (ref 8.7–10.2)
Chloride: 105 mmol/L (ref 96–106)
Creatinine, Ser: 0.47 mg/dL — ABNORMAL LOW (ref 0.57–1.00)
Globulin, Total: 2.9 g/dL (ref 1.5–4.5)
Glucose: 143 mg/dL — ABNORMAL HIGH (ref 70–99)
Potassium: 4.3 mmol/L (ref 3.5–5.2)
Sodium: 137 mmol/L (ref 134–144)
Total Protein: 6.4 g/dL (ref 6.0–8.5)
eGFR: 120 mL/min/{1.73_m2} (ref >59)

## 2022-04-28 LAB — CBC
Hematocrit: 28 % — ABNORMAL LOW (ref 34.0–46.6)
Hemoglobin: 9.3 g/dL — ABNORMAL LOW (ref 11.1–15.9)
MCH: 29 pg (ref 26.6–33.0)
MCHC: 33.2 g/dL (ref 31.5–35.7)
MCV: 87 fL (ref 79–97)
Platelets: 273 10*3/uL (ref 150–450)
RBC: 3.21 x10E6/uL — ABNORMAL LOW (ref 3.77–5.28)
RDW: 14.1 % (ref 11.7–15.4)
WBC: 8 10*3/uL (ref 3.4–10.8)

## 2022-04-28 LAB — PROTEIN / CREATININE RATIO, URINE
Creatinine, Urine: 209.4 mg/dL
Protein, Ur: 41 mg/dL
Protein/Creat Ratio: 196 mg/g creat (ref 0–200)

## 2022-04-28 MED ORDER — FERROUS SULFATE 325 (65 FE) MG PO TABS: 325.0000 mg | ORAL_TABLET | ORAL | 2 refills | Status: DC

## 2022-04-28 NOTE — Addendum Note (Signed)
Addended by: Sharen Counter A on: 04/28/2022 01:32 PM   Modules accepted: Orders

## 2022-04-30 ENCOUNTER — Ambulatory Visit (INDEPENDENT_AMBULATORY_CARE_PROVIDER_SITE_OTHER): Payer: Medicaid Other

## 2022-04-30 DIAGNOSIS — Z0130 Encounter for examination of blood pressure without abnormal findings: Secondary | ICD-10-CM

## 2022-04-30 DIAGNOSIS — O09529 Supervision of elderly multigravida, unspecified trimester: Secondary | ICD-10-CM

## 2022-04-30 NOTE — Progress Notes (Signed)
Patient was assessed and managed by nursing staff during this encounter. I have reviewed the chart and agree with the documentation and plan. I have also made any necessary editorial changes.  Kendel Bessey A Anelis Hrivnak, MD 04/30/2022 12:41 PM   

## 2022-04-30 NOTE — Progress Notes (Addendum)
Subjective:  Gina Chung is a 46 y.o. female here for BP check.   Hypertension ROS: no TIA's, no chest pain on exertion, no dyspnea on exertion, and no swelling of ankles.    Objective:  BP 136/83   Pulse 96   Ht 5\' 2"  (1.575 m)   Wt 189 lb (85.7 kg)   LMP 09/27/2021   BMI 34.57 kg/m   Appearance alert, well appearing, and in no distress. General exam BP noted to be well controlled today in office.    Assessment:   Blood Pressure well controlled.   Plan:  Per Dr. 09/29/2021: Current treatment plan is effective, no change in therapy.  Continue to monitor BP at home. If sx develop please call our Office.  Pt verbalized understanding and agreement.

## 2022-05-11 ENCOUNTER — Ambulatory Visit (INDEPENDENT_AMBULATORY_CARE_PROVIDER_SITE_OTHER): Payer: Medicaid Other | Admitting: Advanced Practice Midwife

## 2022-05-11 ENCOUNTER — Encounter: Payer: Medicaid Other | Admitting: Obstetrics and Gynecology

## 2022-05-11 VITALS — BP 137/78 | HR 102 | Wt 191.0 lb

## 2022-05-11 DIAGNOSIS — O09523 Supervision of elderly multigravida, third trimester: Secondary | ICD-10-CM

## 2022-05-11 DIAGNOSIS — R42 Dizziness and giddiness: Secondary | ICD-10-CM

## 2022-05-11 DIAGNOSIS — O99013 Anemia complicating pregnancy, third trimester: Secondary | ICD-10-CM

## 2022-05-11 DIAGNOSIS — R102 Pelvic and perineal pain: Secondary | ICD-10-CM

## 2022-05-11 DIAGNOSIS — Z3A32 32 weeks gestation of pregnancy: Secondary | ICD-10-CM

## 2022-05-11 DIAGNOSIS — K5900 Constipation, unspecified: Secondary | ICD-10-CM

## 2022-05-11 DIAGNOSIS — O99613 Diseases of the digestive system complicating pregnancy, third trimester: Secondary | ICD-10-CM

## 2022-05-11 DIAGNOSIS — O26893 Other specified pregnancy related conditions, third trimester: Secondary | ICD-10-CM

## 2022-05-11 DIAGNOSIS — O09529 Supervision of elderly multigravida, unspecified trimester: Secondary | ICD-10-CM

## 2022-05-11 NOTE — Progress Notes (Signed)
ROB c/o dizziness x2 days, constipation x 1 week

## 2022-05-11 NOTE — Progress Notes (Signed)
   PRENATAL VISIT NOTE  Subjective:  Gina Chung is a 46 y.o. [id number] at [redacted]w[redacted]d being seen today for ongoing prenatal care.  She is currently monitored for the following issues for this low-risk pregnancy and has AMA (advanced maternal age) multigravida 35+; Positive pregnancy test; Encounter for supervision of high-risk pregnancy with multigravida of advanced maternal age; and Endometrial hyperplasia on their problem list.  Patient reports  dizziness and constipation .  Contractions: Irritability. Vag. Bleeding: None.  Movement: Present. Denies leaking of fluid.   The following portions of the patient's history were reviewed and updated as appropriate: allergies, current medications, past family history, past medical history, past social history, past surgical history and problem list.   Objective:   Vitals:   05/11/22 1435  BP: 137/78  Pulse: (!) 102  Weight: 191 lb (86.6 kg)    Fetal Status: Fetal Heart Rate (bpm): 142 Fundal Height: 32 cm Movement: Present     General:  Alert, oriented and cooperative. Patient is in no acute distress.  Skin: Skin is warm and dry. No rash noted.   Cardiovascular: Normal heart rate noted  Respiratory: Normal respiratory effort, no problems with respiration noted  Abdomen: Soft, gravid, appropriate for gestational age.  Pain/Pressure: Absent     Pelvic: Cervical exam deferred        Extremities: Normal range of motion.  Edema: None  Mental Status: Normal mood and affect. Normal behavior. Normal judgment and thought content.   Assessment and Plan:  Pregnancy: [id number] at [redacted]w[redacted]d 1. Encounter for supervision of high-risk pregnancy with multigravida of advanced maternal age --Anticipatory guidance about next visits/weeks of pregnancy given.   2. [redacted] weeks gestation of pregnancy   3. Dizziness --Dizziness x 2-3 days, occurs when sitting or at rest --Hgb 9.3 on recent CBC, pt having difficulty with oral iron due to constipation --IV iron infusion,  Venofer outpatient, Orders entered  4. Anemia affecting pregnancy in third trimester   5. Constipation during pregnancy in third trimester --Increase PO   6. Pelvic pain during pregnancy in third trimester, antepartum --Pain at pubic symphysis, pt hears cracking sound when changing positions and then pain feels better --Offered PT, pt declined --Pregnancy support belt, good ergonomics, consider PT postpartum if still persists   Preterm labor symptoms and general obstetric precautions including but not limited to vaginal bleeding, contractions, leaking of fluid and fetal movement were reviewed in detail with the patient. Please refer to After Visit Summary for other counseling recommendations.   Return in about 2 weeks (around 05/25/2022) for Female provider preferred, LOB.  Future Appointments  Date Time Provider Department Center  05/15/2022  8:30 AM [facility] NURSE [facility]  05/15/2022  8:45 AM [facility] US4 [facility]  05/19/2022 10:00 AM [facility] RM 2 [facility] None  05/25/2022 10:35 AM Carlynn Herald, CNM [facility] None    Sharen Counter, CNM

## 2022-05-15 ENCOUNTER — Encounter: Payer: Self-pay | Admitting: *Deleted

## 2022-05-15 ENCOUNTER — Ambulatory Visit: Payer: Commercial Managed Care - HMO | Attending: Obstetrics and Gynecology

## 2022-05-15 ENCOUNTER — Ambulatory Visit: Payer: Commercial Managed Care - HMO | Admitting: *Deleted

## 2022-05-15 VITALS — BP 133/61 | HR 96

## 2022-05-15 DIAGNOSIS — O09529 Supervision of elderly multigravida, unspecified trimester: Secondary | ICD-10-CM | POA: Insufficient documentation

## 2022-05-15 DIAGNOSIS — O09522 Supervision of elderly multigravida, second trimester: Secondary | ICD-10-CM | POA: Diagnosis present

## 2022-05-15 DIAGNOSIS — Z6833 Body mass index (BMI) 33.0-33.9, adult: Secondary | ICD-10-CM | POA: Insufficient documentation

## 2022-05-19 ENCOUNTER — Non-Acute Institutional Stay (HOSPITAL_COMMUNITY)
Admission: RE | Admit: 2022-05-19 | Discharge: 2022-05-19 | Disposition: A | Discharge status: Home / Self Care | Payer: Commercial Managed Care - HMO | Source: Ambulatory Visit | Attending: Internal Medicine | Admitting: Internal Medicine

## 2022-05-19 DIAGNOSIS — R42 Dizziness and giddiness: Secondary | ICD-10-CM | POA: Diagnosis not present

## 2022-05-19 DIAGNOSIS — D649 Anemia, unspecified: Secondary | ICD-10-CM | POA: Insufficient documentation

## 2022-05-19 MED ORDER — METHYLPREDNISOLONE SODIUM SUCC 125 MG IJ SOLR: 125.0000 mg | Freq: Once | INTRAMUSCULAR | Status: DC | PRN

## 2022-05-19 MED ORDER — SODIUM CHLORIDE 0.9 % IV SOLN
500.0000 mg | Freq: Once | INTRAVENOUS | Status: AC
  Administered 2022-05-19: 500 mg via INTRAVENOUS
  Filled 2022-05-19: qty 25

## 2022-05-19 MED ORDER — EPINEPHRINE PF 1 MG/ML IJ SOLN: 0.3000 mg | Freq: Once | INTRAMUSCULAR | Status: DC | PRN

## 2022-05-19 MED ORDER — DIPHENHYDRAMINE HCL 50 MG/ML IJ SOLN: 25.0000 mg | Freq: Once | INTRAMUSCULAR | Status: DC | PRN

## 2022-05-19 MED ORDER — SODIUM CHLORIDE 0.9 % IV BOLUS: 500.0000 mL | Freq: Once | INTRAVENOUS | Status: DC | PRN

## 2022-05-19 MED ORDER — ALBUTEROL SULFATE (2.5 MG/3ML) 0.083% IN NEBU: 2.5000 mg | INHALATION_SOLUTION | Freq: Once | RESPIRATORY_TRACT | Status: DC | PRN

## 2022-05-19 MED ORDER — SODIUM CHLORIDE 0.9 % IV SOLN: INTRAVENOUS | Status: DC | PRN

## 2022-05-19 NOTE — Progress Notes (Signed)
PATIENT CARE CENTER NOTE  Diagnosis: Anemia affecting pregnancy in third trimester    Provider: Sharen Counter, CNM   Procedure: Venofer infusion    Note:  Patient completed 500 mg Venofer infusion. Tolerated well. No adverse reaction noted. Vital signs stable. Discharge instructions given. No additional infusions ordered at this time. Patient alert, oriented and ambulatory at discharge.

## 2022-05-19 NOTE — Progress Notes (Signed)
PATIENT CARE CENTER NOTE:  Diagnosis: anemia  Provider: Sharen Counter CNM  Procedure: Venofer 500mg  infusion   Patient received IV Venofer  ( dose 1 of 1). Pt states she has received IV iron previously without incident, pt unsure of what kind of iron she received. Premeds not required per orders, PRN medications available if pt has a reaction, reviewed with pt, verbalized understanding. Pt tolerating infusion well, report given to . RN to continue care until infusion completed.

## 2022-05-25 ENCOUNTER — Ambulatory Visit (INDEPENDENT_AMBULATORY_CARE_PROVIDER_SITE_OTHER): Payer: Medicaid Other | Admitting: Certified Nurse Midwife

## 2022-05-25 VITALS — BP 130/70 | HR 99 | Wt 189.0 lb

## 2022-05-25 DIAGNOSIS — Z3A34 34 weeks gestation of pregnancy: Secondary | ICD-10-CM

## 2022-05-25 DIAGNOSIS — O09523 Supervision of elderly multigravida, third trimester: Secondary | ICD-10-CM

## 2022-05-25 DIAGNOSIS — O0993 Supervision of high risk pregnancy, unspecified, third trimester: Secondary | ICD-10-CM

## 2022-05-25 DIAGNOSIS — O09529 Supervision of elderly multigravida, unspecified trimester: Secondary | ICD-10-CM

## 2022-05-25 DIAGNOSIS — R030 Elevated blood-pressure reading, without diagnosis of hypertension: Secondary | ICD-10-CM

## 2022-05-25 NOTE — Progress Notes (Signed)
ROB, she had an Infusion last week

## 2022-05-26 NOTE — Progress Notes (Signed)
   PRENATAL VISIT NOTE  Subjective:  Gina Chung is a 46 y.o. [id number] at [redacted]w[redacted]d being seen today for ongoing prenatal care.  She is currently monitored for the following issues for this high-risk pregnancy and has AMA (advanced maternal age) multigravida 35+; Positive pregnancy test; Encounter for supervision of high-risk pregnancy with multigravida of advanced maternal age; and Endometrial hyperplasia on their problem list.  Patient reports no complaints.  Contractions: Irregular. Vag. Bleeding: None.  Movement: Present. Denies leaking of fluid.   The following portions of the patient's history were reviewed and updated as appropriate: allergies, current medications, past family history, past medical history, past social history, past surgical history and problem list.   Objective:   Vitals:   05/25/22 1046  BP: 130/70  Pulse: 99  Weight: 189 lb (85.7 kg)    Fetal Status: Fetal Heart Rate (bpm): 140 Fundal Height: 33 cm Movement: Present     General:  Alert, oriented and cooperative. Patient is in no acute distress.  Skin: Skin is warm and dry. No rash noted.   Cardiovascular: Normal heart rate noted  Respiratory: Normal respiratory effort, no problems with respiration noted  Abdomen: Soft, gravid, appropriate for gestational age.  Pain/Pressure: Present     Pelvic: Cervical exam deferred        Extremities: Normal range of motion.  Edema: None  Mental Status: Normal mood and affect. Normal behavior. Normal judgment and thought content.   Assessment and Plan:  Pregnancy: [id number] at [redacted]w[redacted]d 1. Supervision of high risk pregnancy in third trimester - Patient feeling frequent and vigorous fetal movement    3. Encounter for supervision of high-risk pregnancy with multigravida of advanced maternal age - Per MFM:   Serial growth ultrasounds every 4 weeks until delivery.  2. Antenatal testing to start around 36 weeks (either weekly  BPP or twice weekly NST with weekly AFI) or sooner  if she  develops gestational hypertension.  3. Delivery around 39 weeks or sooner if indicated. - Briefly Discussed these with patient, and patient agreeable to plan of care.   - NST scheduled for next visit.   4. Elevated BP without diagnosis of hypertension - Stable  - Patient not checking at home. Discussed the importance of monitoring BPs at home.  - Reviewed preE signs and symptoms with the patient.   5. [redacted] weeks gestation of pregnancy - GBS collection at next visit.   Preterm labor symptoms and general obstetric precautions including but not limited to vaginal bleeding, contractions, leaking of fluid and fetal movement were reviewed in detail with the patient. Please refer to After Visit Summary for other counseling recommendations.   Return in about 2 weeks (around 06/08/2022) for HROB w GBS, NST at next visit .  Future Appointments  Date Time Provider Department Center  06/04/2022  8:15 AM [facility] NST [facility]  06/08/2022  8:55 AM [name], Wilmer Floor, CNM [facility] None  06/11/2022  9:15 AM [facility] NST [facility]  06/15/2022  8:55 AM [name], Wilmer Floor, CNM [facility] None  06/22/2022 11:15 AM Constant, Gigi Gin, MD [facility] None  06/29/2022  9:35 AM Constant, Gigi Gin, MD [facility] None  07/06/2022  9:55 AM Constant, Gigi Gin, MD [facility] None    Ryland Tungate (Danella Deis) Suzie Portela, MSN, CNM  Center for [facility]  05/26/22 9:19 PM

## 2022-06-04 ENCOUNTER — Ambulatory Visit (INDEPENDENT_AMBULATORY_CARE_PROVIDER_SITE_OTHER): Payer: Medicaid Other | Admitting: General Practice

## 2022-06-04 ENCOUNTER — Ambulatory Visit (INDEPENDENT_AMBULATORY_CARE_PROVIDER_SITE_OTHER): Payer: Commercial Managed Care - HMO

## 2022-06-04 ENCOUNTER — Other Ambulatory Visit: Payer: Self-pay

## 2022-06-04 VITALS — BP 123/77 | HR 111

## 2022-06-04 DIAGNOSIS — O09523 Supervision of elderly multigravida, third trimester: Secondary | ICD-10-CM

## 2022-06-04 NOTE — Progress Notes (Signed)
Pt informed that the ultrasound is considered a limited OB ultrasound and is not intended to be a complete ultrasound exam.  Patient also informed that the ultrasound is not being completed with the intent of assessing for fetal or placental anomalies or any pelvic abnormalities.  Explained that the purpose of today's ultrasound is to assess for  BPP, presentation, and AFI.  Patient acknowledges the purpose of the exam and the limitations of the study.     Koren Bound RN BSN 06/04/22

## 2022-06-08 ENCOUNTER — Other Ambulatory Visit (HOSPITAL_COMMUNITY)
Admission: RE | Admit: 2022-06-08 | Discharge: 2022-06-08 | Disposition: A | Discharge status: Home / Self Care | Payer: Commercial Managed Care - HMO | Source: Ambulatory Visit | Attending: Advanced Practice Midwife | Admitting: Advanced Practice Midwife

## 2022-06-08 ENCOUNTER — Encounter: Payer: Self-pay | Admitting: Advanced Practice Midwife

## 2022-06-08 ENCOUNTER — Ambulatory Visit (INDEPENDENT_AMBULATORY_CARE_PROVIDER_SITE_OTHER): Payer: Medicaid Other | Admitting: Advanced Practice Midwife

## 2022-06-08 VITALS — BP 137/83 | HR 107 | Wt 189.8 lb

## 2022-06-08 DIAGNOSIS — R030 Elevated blood-pressure reading, without diagnosis of hypertension: Secondary | ICD-10-CM

## 2022-06-08 DIAGNOSIS — O0993 Supervision of high risk pregnancy, unspecified, third trimester: Secondary | ICD-10-CM | POA: Diagnosis present

## 2022-06-08 DIAGNOSIS — O2243 Hemorrhoids in pregnancy, third trimester: Secondary | ICD-10-CM

## 2022-06-08 DIAGNOSIS — N898 Other specified noninflammatory disorders of vagina: Secondary | ICD-10-CM

## 2022-06-08 DIAGNOSIS — Z3A36 36 weeks gestation of pregnancy: Secondary | ICD-10-CM

## 2022-06-08 DIAGNOSIS — O09523 Supervision of elderly multigravida, third trimester: Secondary | ICD-10-CM

## 2022-06-08 DIAGNOSIS — O99013 Anemia complicating pregnancy, third trimester: Secondary | ICD-10-CM

## 2022-06-08 MED ORDER — LIDOCAINE HCL 2 % EX GEL: 1.0000 | CUTANEOUS | 0 refills | Status: DC | PRN

## 2022-06-08 NOTE — Progress Notes (Signed)
   PRENATAL VISIT NOTE  Subjective:  Gina Chung is a 46 y.o. [id number] at [redacted]w[redacted]d being seen today for ongoing prenatal care.  She is currently monitored for the following issues for this high-risk pregnancy and has AMA (advanced maternal age) multigravida 65+; Positive pregnancy test; Encounter for supervision of high-risk pregnancy with multigravida of advanced maternal age; and Endometrial hyperplasia on their problem list.  Patient reports occasional contractions.  Contractions: Irritability. Vag. Bleeding: None.  Movement: Present. Denies leaking of fluid.   The following portions of the patient's history were reviewed and updated as appropriate: allergies, current medications, past family history, past medical history, past social history, past surgical history and problem list.   Objective:   Vitals:   06/08/22 0902  BP: 137/83  Pulse: (!) 107  Weight: 189 lb 12.8 oz (86.1 kg)    Fetal Status: Fetal Heart Rate (bpm): 140 Fundal Height: 36 cm Movement: Present  Presentation: Vertex  General:  Alert, oriented and cooperative. Patient is in no acute distress.  Skin: Skin is warm and dry. No rash noted.   Cardiovascular: Normal heart rate noted  Respiratory: Normal respiratory effort, no problems with respiration noted  Abdomen: Soft, gravid, appropriate for gestational age.  Pain/Pressure: Absent     Pelvic: Cervical exam performed in the presence of a chaperone Dilation: Closed Effacement (%): 20 Station: Ballotable  Extremities: Normal range of motion.  Edema: None  Mental Status: Normal mood and affect. Normal behavior. Normal judgment and thought content.   Assessment and Plan:  Pregnancy: [id number] at [redacted]w[redacted]d 1. Multigravida of advanced maternal age in third trimester --Schedule IOl for 39 weeks for AMA over age 90   2. Supervision of high risk pregnancy in third trimester --Anticipatory guidance about next visits/weeks of pregnancy given.  --Reviewed labor readiness with  patient including the [place], evening primrose oil, and raspberry leaf tea.    - Culture, beta strep (group b only) - Cervicovaginal ancillary only( [place])  3. Elevated BP without diagnosis of hypertension --BP elevated on 8/14, retake wnl, labs and recheck in office wnl --Pt normotensive without s/sx of PEC today  4. [redacted] weeks gestation of pregnancy   5. Anemia affecting pregnancy in third trimester --Pt with improved symptoms after outpatient iron infusion but symptoms, including craving ice, fatigue have returned --Discussed iron rich foods in diet  - CBC   6. Hemorrhoids during pregnancy in third trimester --Pt not taking oral iron due to hx constipation, currently without constipation --continue high fiber diet, add topical lidocaine  --Epsom salt baths may be helpful  - lidocaine (XYLOCAINE) 2 % jelly; Apply 1 Application topically as needed.  Dispense: 30 mL; Refill: 0    7. Vaginal itching  - Cervicovaginal ancillary only( [place])   Term labor symptoms and general obstetric precautions including but not limited to vaginal bleeding, contractions, leaking of fluid and fetal movement were reviewed in detail with the patient. Please refer to After Visit Summary for other counseling recommendations.   Return in about 1 week (around 06/15/2022) for Female provider preferred, LOB.  Future Appointments  Date Time Provider [place]  06/11/2022  9:15 AM [facility] NST [facility]  06/15/2022  8:55 AM [name], Kathie Dike, CNM [facility] None  06/22/2022 11:15 AM Constant, Vickii Chafe, MD [facility] None  06/29/2022  9:35 AM Constant, Vickii Chafe, MD [facility] None  07/06/2022  9:55 AM Constant, Vickii Chafe, MD [place] None    Fatima Blank, CNM

## 2022-06-08 NOTE — Addendum Note (Signed)
Addended by: Fatima Blank A on: 06/08/2022 01:14 PM   Modules accepted: Orders

## 2022-06-08 NOTE — Addendum Note (Signed)
Addended by: Fatima Blank A on: 06/08/2022 04:51 PM   Modules accepted: Orders

## 2022-06-08 NOTE — Progress Notes (Signed)
Pt presents for ROB visit. Pt c/o vaginal itch and hemorrhoids. Pt also c/o of cough for 10 days.

## 2022-06-09 LAB — CBC
Hematocrit: 32.6 % — ABNORMAL LOW (ref 34.0–46.6)
Hemoglobin: 10.4 g/dL — ABNORMAL LOW (ref 11.1–15.9)
MCH: 27.9 pg (ref 26.6–33.0)
MCHC: 31.9 g/dL (ref 31.5–35.7)
MCV: 87 fL (ref 79–97)
Platelets: 324 10*3/uL (ref 150–450)
RBC: 3.73 x10E6/uL — ABNORMAL LOW (ref 3.77–5.28)
RDW: 14.8 % (ref 11.7–15.4)
WBC: 8.2 10*3/uL (ref 3.4–10.8)

## 2022-06-09 LAB — CERVICOVAGINAL ANCILLARY ONLY
Chlamydia: NEGATIVE
Comment: NEGATIVE
Comment: NEGATIVE
Comment: NORMAL
Neisseria Gonorrhea: NEGATIVE
Trichomonas: NEGATIVE

## 2022-06-11 ENCOUNTER — Ambulatory Visit: Payer: Medicaid Other | Admitting: *Deleted

## 2022-06-11 ENCOUNTER — Ambulatory Visit (INDEPENDENT_AMBULATORY_CARE_PROVIDER_SITE_OTHER): Payer: Commercial Managed Care - HMO

## 2022-06-11 ENCOUNTER — Other Ambulatory Visit: Payer: Self-pay

## 2022-06-11 VITALS — BP 108/72 | HR 110

## 2022-06-11 DIAGNOSIS — O09523 Supervision of elderly multigravida, third trimester: Secondary | ICD-10-CM | POA: Diagnosis not present

## 2022-06-11 NOTE — Progress Notes (Signed)

## 2022-06-12 LAB — CULTURE, BETA STREP (GROUP B ONLY): Strep Gp B Culture: NEGATIVE

## 2022-06-15 ENCOUNTER — Encounter: Payer: Self-pay | Admitting: Advanced Practice Midwife

## 2022-06-15 ENCOUNTER — Ambulatory Visit (INDEPENDENT_AMBULATORY_CARE_PROVIDER_SITE_OTHER): Payer: Medicaid Other | Admitting: Advanced Practice Midwife

## 2022-06-15 VITALS — BP 130/72 | HR 85 | Wt 193.7 lb

## 2022-06-15 DIAGNOSIS — O99013 Anemia complicating pregnancy, third trimester: Secondary | ICD-10-CM

## 2022-06-15 DIAGNOSIS — Z3A37 37 weeks gestation of pregnancy: Secondary | ICD-10-CM

## 2022-06-15 DIAGNOSIS — O09523 Supervision of elderly multigravida, third trimester: Secondary | ICD-10-CM

## 2022-06-15 DIAGNOSIS — O0993 Supervision of high risk pregnancy, unspecified, third trimester: Secondary | ICD-10-CM

## 2022-06-15 NOTE — Progress Notes (Signed)
   PRENATAL VISIT NOTE  Subjective:  Gina Chung is a 46 y.o. [id number] at [redacted]w[redacted]d being seen today for ongoing prenatal care.  She is currently monitored for the following issues for this high-risk pregnancy and has AMA (advanced maternal age) multigravida 37+; Positive pregnancy test; Encounter for supervision of high-risk pregnancy with multigravida of advanced maternal age; and Endometrial hyperplasia on their problem list.  Patient reports no complaints.  Contractions: Irritability. Vag. Bleeding: None.  Movement: Present. Denies leaking of fluid.   The following portions of the patient's history were reviewed and updated as appropriate: allergies, current medications, past family history, past medical history, past social history, past surgical history and problem list.   Objective:   Vitals:   06/15/22 0913  BP: 130/72  Pulse: 85  Weight: 193 lb 11.2 oz (87.9 kg)    Fetal Status: Fetal Heart Rate (bpm): 136   Movement: Present     General:  Alert, oriented and cooperative. Patient is in no acute distress.  Skin: Skin is warm and dry. No rash noted.   Cardiovascular: Normal heart rate noted  Respiratory: Normal respiratory effort, no problems with respiration noted  Abdomen: Soft, gravid, appropriate for gestational age.  Pain/Pressure: Present     Pelvic: Cervical exam deferred        Extremities: Normal range of motion.  Edema: None  Mental Status: Normal mood and affect. Normal behavior. Normal judgment and thought content.   Assessment and Plan:  Pregnancy: [id number] at [redacted]w[redacted]d 1. Multigravida of advanced maternal age in third trimester --IOL at 19 weeks scheduled --Antenatal testing  2. Supervision of high risk pregnancy in third trimester --Anticipatory guidance about next visits/weeks of pregnancy given.   3. Anemia affecting pregnancy in third trimester --Hgb 10.4 on 9/24, pt not tolerating oral iron, is eating iron rich foods  4. [redacted] weeks gestation of  pregnancy   Term labor symptoms and general obstetric precautions including but not limited to vaginal bleeding, contractions, leaking of fluid and fetal movement were reviewed in detail with the patient. Please refer to After Visit Summary for other counseling recommendations.   Return in about 1 week (around 06/22/2022) for As scheduled, Female provider preferred.  Future Appointments  Date Time Provider [place]  06/17/2022  8:15 AM [facility]  06/22/2022 11:15 AM Constant, Peggy, MD [facility] None  06/24/2022  8:15 AM [facility]  06/27/2022  6:30 AM [facility] SCHED ROOM [facility] None  06/29/2022  9:35 AM Constant, Vickii Chafe, MD [facility] None  07/06/2022  9:55 AM Constant, Vickii Chafe, MD [facility] None    Fatima Blank, CNM

## 2022-06-15 NOTE — Patient Instructions (Signed)
Labor Precautions Reasons to come to [facility]U at [facility]:  1.  Contractions are  5 minutes apart or less, each last 1 minute, these have been going on for 1-2 hours, and you cannot walk or talk during them 2.  You have a large gush of fluid, or a trickle of fluid that will not stop and you have to wear a pad 3.  You have bleeding that is bright red, heavier than spotting--like menstrual bleeding (spotting can be normal in early labor or after a check of your cervix) 4.  You do not feel the baby moving like he/she normally does  

## 2022-06-15 NOTE — Progress Notes (Signed)
Patient presents for [place] visit. Patient reports vaginal itching with some discharge. Pt states she shaved a few days ago. No other concerns at this time.

## 2022-06-17 ENCOUNTER — Ambulatory Visit: Payer: Medicaid Other | Admitting: *Deleted

## 2022-06-17 ENCOUNTER — Other Ambulatory Visit: Payer: Self-pay

## 2022-06-17 ENCOUNTER — Ambulatory Visit (INDEPENDENT_AMBULATORY_CARE_PROVIDER_SITE_OTHER): Payer: Commercial Managed Care - HMO

## 2022-06-17 VITALS — BP 133/69 | HR 96

## 2022-06-17 DIAGNOSIS — O09523 Supervision of elderly multigravida, third trimester: Secondary | ICD-10-CM

## 2022-06-17 NOTE — Progress Notes (Signed)

## 2022-06-22 ENCOUNTER — Encounter (HOSPITAL_COMMUNITY): Payer: Self-pay | Admitting: *Deleted

## 2022-06-22 ENCOUNTER — Telehealth (HOSPITAL_COMMUNITY): Payer: Self-pay | Admitting: *Deleted

## 2022-06-22 ENCOUNTER — Ambulatory Visit (INDEPENDENT_AMBULATORY_CARE_PROVIDER_SITE_OTHER): Payer: Medicaid Other | Admitting: Obstetrics and Gynecology

## 2022-06-22 ENCOUNTER — Encounter: Payer: Self-pay | Admitting: Obstetrics and Gynecology

## 2022-06-22 ENCOUNTER — Ambulatory Visit (INDEPENDENT_AMBULATORY_CARE_PROVIDER_SITE_OTHER): Payer: Commercial Managed Care - HMO

## 2022-06-22 VITALS — BP 125/80 | HR 92 | Wt 192.7 lb

## 2022-06-22 DIAGNOSIS — O09523 Supervision of elderly multigravida, third trimester: Secondary | ICD-10-CM

## 2022-06-22 DIAGNOSIS — O09529 Supervision of elderly multigravida, unspecified trimester: Secondary | ICD-10-CM

## 2022-06-22 DIAGNOSIS — Z3A38 38 weeks gestation of pregnancy: Secondary | ICD-10-CM

## 2022-06-22 NOTE — Progress Notes (Signed)
   PRENATAL VISIT NOTE  Subjective:  Gina Chung is a 46 y.o. [id number] at [redacted]w[redacted]d being seen today for ongoing prenatal care.  She is currently monitored for the following issues for this high-risk pregnancy and has AMA (advanced maternal age) multigravida 61+; Positive pregnancy test; Encounter for supervision of high-risk pregnancy with multigravida of advanced maternal age; and Endometrial hyperplasia on their problem list.  Patient reports no complaints.  Contractions: Irritability. Vag. Bleeding: None.  Movement: Present. Denies leaking of fluid.   The following portions of the patient's history were reviewed and updated as appropriate: allergies, current medications, past family history, past medical history, past social history, past surgical history and problem list.   Objective:   Vitals:   06/22/22 1112  BP: 125/80  Pulse: 92  Weight: 192 lb 11.2 oz (87.4 kg)    Fetal Status:     Movement: Present     General:  Alert, oriented and cooperative. Patient is in no acute distress.  Skin: Skin is warm and dry. No rash noted.   Cardiovascular: Normal heart rate noted  Respiratory: Normal respiratory effort, no problems with respiration noted  Abdomen: Soft, gravid, appropriate for gestational age.  Pain/Pressure: Present     Pelvic: Cervical exam deferred        Extremities: Normal range of motion.  Edema: None  Mental Status: Normal mood and affect. Normal behavior. Normal judgment and thought content.   Assessment and Plan:  Pregnancy: [id number] at [redacted]w[redacted]d 1. Encounter for supervision of high-risk pregnancy with multigravida of advanced maternal age Patient is doing well without complaints Limited US today shows fetus in cephalic position with AFI 7- stable from last week - US OB Limited; Future  2. Multigravida of advanced maternal age in third trimester Scheduled for BPP on 10/11 IOL on 10/14 - US OB Limited; Future  Term labor symptoms and general obstetric precautions  including but not limited to vaginal bleeding, contractions, leaking of fluid and fetal movement were reviewed in detail with the patient. Please refer to After Visit Summary for other counseling recommendations.   Return in about 6 weeks (around 08/03/2022) for postpartum.  Future Appointments  Date Time Provider [place]  06/24/2022  8:15 AM [facility]  06/27/2022  6:30 AM [facility] SCHED ROOM [facility] None  06/29/2022  9:35 AM Kirstein Baxley, Vickii Chafe, MD [facility] None  07/06/2022  9:55 AM Jace Fermin, Vickii Chafe, MD [facility] None    Mora Bellman, MD

## 2022-06-22 NOTE — Telephone Encounter (Signed)
Preadmission screen  

## 2022-06-24 ENCOUNTER — Other Ambulatory Visit: Payer: Self-pay | Admitting: Advanced Practice Midwife

## 2022-06-24 ENCOUNTER — Other Ambulatory Visit: Payer: Self-pay

## 2022-06-24 ENCOUNTER — Ambulatory Visit: Payer: Medicaid Other | Admitting: *Deleted

## 2022-06-24 ENCOUNTER — Ambulatory Visit (INDEPENDENT_AMBULATORY_CARE_PROVIDER_SITE_OTHER): Payer: Commercial Managed Care - HMO

## 2022-06-24 VITALS — BP 119/70 | HR 85

## 2022-06-24 DIAGNOSIS — O09523 Supervision of elderly multigravida, third trimester: Secondary | ICD-10-CM

## 2022-06-24 NOTE — Progress Notes (Signed)

## 2022-06-27 ENCOUNTER — Encounter (HOSPITAL_COMMUNITY): Payer: Self-pay | Admitting: Family Medicine

## 2022-06-27 ENCOUNTER — Inpatient Hospital Stay (HOSPITAL_COMMUNITY): Payer: Commercial Managed Care - HMO | Admitting: Anesthesiology

## 2022-06-27 ENCOUNTER — Inpatient Hospital Stay (HOSPITAL_COMMUNITY): Payer: Commercial Managed Care - HMO

## 2022-06-27 ENCOUNTER — Inpatient Hospital Stay (HOSPITAL_COMMUNITY)
Admission: AD | Admit: 2022-06-27 | Discharge: 2022-06-29 | DRG: 807 | Disposition: A | Discharge status: Home / Self Care | Payer: Commercial Managed Care - HMO | Attending: Obstetrics & Gynecology | Admitting: Obstetrics & Gynecology

## 2022-06-27 DIAGNOSIS — O09523 Supervision of elderly multigravida, third trimester: Secondary | ICD-10-CM

## 2022-06-27 DIAGNOSIS — O26893 Other specified pregnancy related conditions, third trimester: Secondary | ICD-10-CM | POA: Diagnosis present

## 2022-06-27 DIAGNOSIS — O09529 Supervision of elderly multigravida, unspecified trimester: Secondary | ICD-10-CM

## 2022-06-27 DIAGNOSIS — O43893 Other placental disorders, third trimester: Secondary | ICD-10-CM

## 2022-06-27 DIAGNOSIS — O99214 Obesity complicating childbirth: Secondary | ICD-10-CM | POA: Diagnosis present

## 2022-06-27 DIAGNOSIS — R030 Elevated blood-pressure reading, without diagnosis of hypertension: Secondary | ICD-10-CM | POA: Diagnosis present

## 2022-06-27 DIAGNOSIS — Z3A39 39 weeks gestation of pregnancy: Secondary | ICD-10-CM

## 2022-06-27 DIAGNOSIS — N8500 Endometrial hyperplasia, unspecified: Secondary | ICD-10-CM | POA: Diagnosis present

## 2022-06-27 DIAGNOSIS — O9902 Anemia complicating childbirth: Secondary | ICD-10-CM | POA: Diagnosis present

## 2022-06-27 LAB — CBC
HCT: 31.1 % — ABNORMAL LOW (ref 36.0–46.0)
Hemoglobin: 10 g/dL — ABNORMAL LOW (ref 12.0–15.0)
MCH: 29.1 pg (ref 26.0–34.0)
MCHC: 32.2 g/dL (ref 30.0–36.0)
MCV: 90.4 fL (ref 80.0–100.0)
Platelets: 257 10*3/uL (ref 150–400)
RBC: 3.44 MIL/uL — ABNORMAL LOW (ref 3.87–5.11)
RDW: 15.8 % — ABNORMAL HIGH (ref 11.5–15.5)
WBC: 5.9 10*3/uL (ref 4.0–10.5)
nRBC: 0 % (ref 0.0–0.2)

## 2022-06-27 LAB — TYPE AND SCREEN
ABO/RH(D): O POS
Antibody Screen: NEGATIVE

## 2022-06-27 MED ORDER — PHENYLEPHRINE 80 MCG/ML (10ML) SYRINGE FOR IV PUSH (FOR BLOOD PRESSURE SUPPORT)
80.0000 ug | PREFILLED_SYRINGE | INTRAVENOUS | Status: DC | PRN
  Filled 2022-06-27 (×2): qty 10

## 2022-06-27 MED ORDER — SIMETHICONE 80 MG PO CHEW
80.0000 mg | CHEWABLE_TABLET | ORAL | Status: DC | PRN
  Administered 2022-06-29: 80 mg via ORAL
  Filled 2022-06-27: qty 1

## 2022-06-27 MED ORDER — ZOLPIDEM TARTRATE 5 MG PO TABS: 5.0000 mg | ORAL_TABLET | Freq: Every evening | ORAL | Status: DC | PRN

## 2022-06-27 MED ORDER — EPHEDRINE 5 MG/ML INJ
10.0000 mg | INTRAVENOUS | Status: DC | PRN
  Filled 2022-06-27: qty 5

## 2022-06-27 MED ORDER — EPHEDRINE 5 MG/ML INJ: 10.0000 mg | INTRAVENOUS | Status: DC | PRN

## 2022-06-27 MED ORDER — LACTATED RINGERS IV SOLN: 500.0000 mL | Freq: Once | INTRAVENOUS | Status: DC

## 2022-06-27 MED ORDER — MISOPROSTOL 50MCG HALF TABLET
50.0000 ug | ORAL_TABLET | Freq: Once | ORAL | Status: DC
  Filled 2022-06-27: qty 1

## 2022-06-27 MED ORDER — ONDANSETRON HCL 4 MG PO TABS: 4.0000 mg | ORAL_TABLET | ORAL | Status: DC | PRN

## 2022-06-27 MED ORDER — OXYTOCIN BOLUS FROM INFUSION
333.0000 mL | Freq: Once | INTRAVENOUS | Status: AC
  Administered 2022-06-27: 333 mL via INTRAVENOUS

## 2022-06-27 MED ORDER — DIPHENHYDRAMINE HCL 25 MG PO CAPS: 25.0000 mg | ORAL_CAPSULE | Freq: Four times a day (QID) | ORAL | Status: DC | PRN

## 2022-06-27 MED ORDER — LIDOCAINE HCL (PF) 1 % IJ SOLN: 30.0000 mL | INTRAMUSCULAR | Status: DC | PRN

## 2022-06-27 MED ORDER — WITCH HAZEL-GLYCERIN EX PADS: 1.0000 | MEDICATED_PAD | CUTANEOUS | Status: DC | PRN

## 2022-06-27 MED ORDER — PHENYLEPHRINE 80 MCG/ML (10ML) SYRINGE FOR IV PUSH (FOR BLOOD PRESSURE SUPPORT)
80.0000 ug | PREFILLED_SYRINGE | INTRAVENOUS | Status: DC | PRN
  Administered 2022-06-27 (×2): 80 ug via INTRAVENOUS

## 2022-06-27 MED ORDER — IBUPROFEN 600 MG PO TABS
600.0000 mg | ORAL_TABLET | Freq: Four times a day (QID) | ORAL | Status: DC
  Administered 2022-06-27 – 2022-06-29 (×7): 600 mg via ORAL
  Filled 2022-06-27 (×7): qty 1

## 2022-06-27 MED ORDER — ACETAMINOPHEN 325 MG PO TABS
650.0000 mg | ORAL_TABLET | ORAL | Status: DC | PRN
  Filled 2022-06-27: qty 2

## 2022-06-27 MED ORDER — LACTATED RINGERS IV SOLN: INTRAVENOUS | Status: DC

## 2022-06-27 MED ORDER — BENZOCAINE-MENTHOL 20-0.5 % EX AERO
1.0000 | INHALATION_SPRAY | CUTANEOUS | Status: DC | PRN
  Administered 2022-06-27: 1 via TOPICAL
  Filled 2022-06-27: qty 56

## 2022-06-27 MED ORDER — MISOPROSTOL 50MCG HALF TABLET
50.0000 ug | ORAL_TABLET | Freq: Once | ORAL | Status: AC
  Administered 2022-06-27: 50 ug via ORAL
  Filled 2022-06-27: qty 1

## 2022-06-27 MED ORDER — FENTANYL-BUPIVACAINE-NACL 0.5-0.125-0.9 MG/250ML-% EP SOLN
12.0000 mL/h | EPIDURAL | Status: DC | PRN
  Administered 2022-06-27: 12 mL/h via EPIDURAL
  Filled 2022-06-27: qty 250

## 2022-06-27 MED ORDER — OXYCODONE-ACETAMINOPHEN 5-325 MG PO TABS: 1.0000 | ORAL_TABLET | ORAL | Status: DC | PRN

## 2022-06-27 MED ORDER — LIDOCAINE HCL (PF) 1 % IJ SOLN
INTRAMUSCULAR | Status: DC | PRN
  Administered 2022-06-27 (×2): 4 mL via EPIDURAL

## 2022-06-27 MED ORDER — OXYTOCIN-SODIUM CHLORIDE 30-0.9 UT/500ML-% IV SOLN
2.5000 [IU]/h | INTRAVENOUS | Status: DC
  Filled 2022-06-27: qty 500

## 2022-06-27 MED ORDER — ACETAMINOPHEN 325 MG PO TABS: 650.0000 mg | ORAL_TABLET | ORAL | Status: DC | PRN

## 2022-06-27 MED ORDER — TETANUS-DIPHTH-ACELL PERTUSSIS 5-2.5-18.5 LF-MCG/0.5 IM SUSY: 0.5000 mL | PREFILLED_SYRINGE | Freq: Once | INTRAMUSCULAR | Status: DC

## 2022-06-27 MED ORDER — ONDANSETRON HCL 4 MG/2ML IJ SOLN: 4.0000 mg | Freq: Four times a day (QID) | INTRAMUSCULAR | Status: DC | PRN

## 2022-06-27 MED ORDER — SOD CITRATE-CITRIC ACID 500-334 MG/5ML PO SOLN: 30.0000 mL | ORAL | Status: DC | PRN

## 2022-06-27 MED ORDER — COCONUT OIL OIL: 1.0000 | TOPICAL_OIL | Status: DC | PRN

## 2022-06-27 MED ORDER — SENNOSIDES-DOCUSATE SODIUM 8.6-50 MG PO TABS
2.0000 | ORAL_TABLET | Freq: Every day | ORAL | Status: DC
  Administered 2022-06-28 – 2022-06-29 (×2): 2 via ORAL
  Filled 2022-06-27 (×2): qty 2

## 2022-06-27 MED ORDER — TERBUTALINE SULFATE 1 MG/ML IJ SOLN: 0.2500 mg | Freq: Once | INTRAMUSCULAR | Status: DC | PRN

## 2022-06-27 MED ORDER — PRENATAL MULTIVITAMIN CH
1.0000 | ORAL_TABLET | Freq: Every day | ORAL | Status: DC
  Administered 2022-06-28 – 2022-06-29 (×2): 1 via ORAL
  Filled 2022-06-27 (×4): qty 1

## 2022-06-27 MED ORDER — ONDANSETRON HCL 4 MG/2ML IJ SOLN: 4.0000 mg | INTRAMUSCULAR | Status: DC | PRN

## 2022-06-27 MED ORDER — DIBUCAINE (PERIANAL) 1 % EX OINT: 1.0000 | TOPICAL_OINTMENT | CUTANEOUS | Status: DC | PRN

## 2022-06-27 MED ORDER — MISOPROSTOL 25 MCG QUARTER TABLET
25.0000 ug | ORAL_TABLET | Freq: Once | ORAL | Status: AC
  Administered 2022-06-27: 25 ug via VAGINAL
  Filled 2022-06-27: qty 1

## 2022-06-27 MED ORDER — OXYCODONE-ACETAMINOPHEN 5-325 MG PO TABS: 2.0000 | ORAL_TABLET | ORAL | Status: DC | PRN

## 2022-06-27 MED ORDER — LACTATED RINGERS IV SOLN
500.0000 mL | INTRAVENOUS | Status: DC | PRN
  Administered 2022-06-27: 1000 mL via INTRAVENOUS

## 2022-06-27 MED ORDER — DIPHENHYDRAMINE HCL 50 MG/ML IJ SOLN: 12.5000 mg | INTRAMUSCULAR | Status: DC | PRN

## 2022-06-27 NOTE — Progress Notes (Signed)
LABOR PROGRESS NOTE  Gina Chung is a 46 y.o. [id number] at [redacted]w[redacted]d  admitted for IOL s/t AMA.   Subjective: Patient is doing well. She feels her contractions, but is comfortable.   Objective: BP (!) 106/53   Pulse 78   Temp 98.2 F (36.8 C) (Oral)   Resp 18   Ht 5\' 2"  (1.575 m)   Wt 86.2 kg   LMP 09/27/2021   BMI 34.75 kg/m  or  Vitals:   06/27/22 0947 06/27/22 1200 06/27/22 1305 06/27/22 1539  BP:  113/65 (!) 102/46 (!) 106/53  Pulse:  83 87 78  Resp: 16   18  Temp: 98.3 F (36.8 C)   98.2 F (36.8 C)  TempSrc: Oral   Oral  Weight:      Height:        SVE Dilation: 4 Effacement (%): 30 Station: -3 Presentation: Vertex Exam by:: Gwendolyn Lima, MD FHT: baseline rate 125, moderate varibility, +acel, -decel Toco: irregular contractions with uterine irritability   Labs: Lab Results  Component Value Date   WBC 5.9 06/27/2022   HGB 10.0 (L) 06/27/2022   HCT 31.1 (L) 06/27/2022   MCV 90.4 06/27/2022   PLT 257 06/27/2022    Patient Active Problem List   Diagnosis Date Noted   Endometrial hyperplasia 02/11/2022   Encounter for supervision of high-risk pregnancy with multigravida of advanced maternal age 22/21/2023   Positive pregnancy test 11/05/2021   AMA (advanced maternal age) multigravida 35+ 09/03/2016    Assessment / Plan: 46 y.o. [id number] at [redacted]w[redacted]d here for IOL s/t AMA.   Labor: 25/50 cytotec, give another 47 cytotec orally, continue to monitor Fetal Wellbeing:  Cat 1  Pain Control:  None currently, planning on epidural   Anticipated MOD:  SVD  Lowry Ram, MD  PGY-1, [facility]  06/27/2022, 4:26 PM

## 2022-06-27 NOTE — H&P (Addendum)
OBSTETRIC ADMISSION HISTORY AND PHYSICAL  Gina Chung is a 46 y.o. female [phone] with IUP at [redacted]w[redacted]d by LMP presenting for IOL s/t AMA. She reports +FMs, No LOF, no VB, no blurry vision, headaches or peripheral edema, and RUQ pain.  She plans on breast and bottle feeding. She requests patch for birth control. She received her prenatal care at  [place]: By LMP --->  Estimated Date of Delivery: 07/04/22  Sono:    @[redacted]w[redacted]d , CWD, normal anatomy, vertex presentation, anterior lie, 2123g, 58% EFW   Prenatal History/Complications:   Elevated BP w/o diagnosis of HTN AMA, Multigravid, Obesity   Past Medical History: Past Medical History:  Diagnosis Date   Anemia    Diabetes mellitus without complication ([place])    Gestational diabetes    History of UTI    Language barrier    arabic and english   Migraine    Vaginal Pap smear, abnormal     Past Surgical History: Past Surgical History:  Procedure Laterality Date   COLPOSCOPY     DILATION AND EVACUATION N/A 09/11/2016   Procedure: DILATATION AND EVACUATION;  Surgeon: Chancy Milroy, MD;  Location: [facility];  Service: Gynecology;  Laterality: N/A;    Obstetrical History: OB History     Gravida  8   Para  5   Term  5   Preterm  0   AB  2   Living  5      SAB  2   IAB  0   Ectopic  0   Multiple  0   Live Births  5           Social History Social History   Socioeconomic History   Marital status: Married    Spouse name: Not on file   Number of children: 3   Years of education: Not on file   Highest education level: Not on file  Occupational History   Occupation: homemaker  Tobacco Use   Smoking status: Never   Smokeless tobacco: Never  Vaping Use   Vaping Use: Never used  Substance and Sexual Activity   Alcohol use: No   Drug use: No   Sexual activity: Yes    Partners: Male    Birth control/protection: None  Other Topics Concern   Not on file  Social History Narrative   Not on file    Social Determinants of Health   Financial Resource Strain: Not on file  Food Insecurity: No Food Insecurity (06/27/2022)   Hunger Vital Sign    Worried About Running Out of Food in the Last Year: Never true    Ran Out of Food in the Last Year: Never true  Transportation Needs: No Transportation Needs (06/27/2022)   PRAPARE - Hydrologist (Medical): No    Lack of Transportation (Non-Medical): No  Physical Activity: Not on file  Stress: Not on file  Social Connections: Not on file    Family History: Family History  Problem Relation Age of Onset   Rheum arthritis Mother    Hypertension Mother    Hypertension Father    Rheum arthritis Maternal Grandmother    Diabetes Maternal Grandfather     Allergies: No Known Allergies  Medications Prior to Admission  Medication Sig Dispense Refill Last Dose   pantoprazole (PROTONIX) 40 MG tablet Take 1 tablet (40 mg total) by mouth daily. 30 tablet 2 06/26/2022   lidocaine (XYLOCAINE) 2 % jelly Apply 1  Application topically as needed. (Patient not taking: Reported on 06/15/2022) 30 mL 0    Prenatal Vit-Fe Fumarate-FA (PRENATAL PLUS VITAMIN/MINERAL) 27-1 MG TABS Take 1 tablet by mouth daily after breakfast. 30 tablet 11 Unknown     Review of Systems   All systems reviewed and negative except as stated in HPI  Temperature 98.3 F (36.8 C), temperature source Oral, resp. rate 16, height 5\' 2"  (1.575 m), weight 86.2 kg, last menstrual period 09/27/2021, unknown if currently breastfeeding. General appearance: No acute distress Lungs: clear to auscultation bilaterally Heart: regular rate noted Abdomen: soft, gravid Extremities: Homans sign is negative, no sign of DVT Presentation: cephalic Fetal monitoring Baseline 125 bpm, moderate variability, - accels, - decels  Uterine activity Uterine irritability  Dilation: 3 Effacement (%): 50 Station: -3 Exam by:: Bary Castilla, RN   Prenatal labs: ABO, Rh:  --/--/O POS (10/14 0935) Antibody: NEG (10/14 0935) Rubella: 7.70 (04/12 1603) RPR: Non Reactive (04/12 1603)  HBsAg: Negative (04/12 1603)  HIV: Non Reactive (04/12 1603)  GBS: Negative/-- (09/25 0958)  1 hr Glucola 167 Genetic screening  low risk female  Anatomy US wnl  Prenatal Transfer Tool  Maternal Diabetes: No Genetic Screening: Normal Maternal Ultrasounds/Referrals: Normal Fetal Ultrasounds or other Referrals:  Referred to Materal Fetal Medicine  Maternal Substance Abuse:  No Significant Maternal Medications:  None Significant Maternal Lab Results:  Group B Strep negative Number of Prenatal Visits:greater than 3 verified prenatal visits Other Comments:  None  Results for orders placed or performed during the hospital encounter of 06/27/22 (from the past 24 hour(s))  Type and screen   Collection Time: 06/27/22  9:35 AM  Result Value Ref Range   ABO/RH(D) O POS    Antibody Screen NEG    Sample Expiration      06/30/2022,2359 Performed at [facility], [address], [place], [place] [address]   CBC   Collection Time: 06/27/22  9:37 AM  Result Value Ref Range   WBC 5.9 4.0 - 10.5 K/uL   RBC 3.44 (L) 3.87 - 5.11 MIL/uL   Hemoglobin 10.0 (L) 12.0 - 15.0 g/dL   HCT 31.1 (L) 36.0 - 46.0 %   MCV 90.4 80.0 - 100.0 fL   MCH 29.1 26.0 - 34.0 pg   MCHC 32.2 30.0 - 36.0 g/dL   RDW 15.8 (H) 11.5 - 15.5 %   Platelets 257 150 - 400 K/uL   nRBC 0.0 0.0 - 0.2 %    Patient Active Problem List   Diagnosis Date Noted   Endometrial hyperplasia 02/11/2022   Encounter for supervision of high-risk pregnancy with multigravida of advanced maternal age 84/21/2023   Positive pregnancy test 11/05/2021   AMA (advanced maternal age) multigravida 35+ 09/03/2016    Assessment/Plan:  Gina Chung is a 46 y.o. [id number] at [redacted]w[redacted]d here for IOL s/t AMA, multigravid, obesity.   #Labor: Dual cyto x1. Consider pitocin/AROM when appropriate.  #Pain: Planninf for epidural #FWB: Cat 1   #ID:  GBS neg #MOF: Both  #MOC: Patch #Circ:  Desired   Lowry Ram, MD  06/27/2022, 11:05 AM  GME ATTESTATION:  I saw and evaluated the patient. I agree with the findings and the plan of care as documented in the resident's note. I have made changes to documentation as necessary.  Gerlene Fee, DO OB Fellow, [place] for [place] 06/27/2022, 1:27 PM

## 2022-06-27 NOTE — Anesthesia Procedure Notes (Signed)
Epidural Patient location during procedure: OB Start time: 06/27/2022 5:42 PM End time: 06/27/2022 5:45 PM  Staffing Anesthesiologist: Brennan Bailey, MD Performed: anesthesiologist   Preanesthetic Checklist Completed: patient identified, IV checked, risks and benefits discussed, monitors and equipment checked, pre-op evaluation and timeout performed  Epidural Patient position: sitting Prep: DuraPrep and site prepped and draped Patient monitoring: continuous pulse ox, blood pressure and heart rate Approach: midline Location: L3-L4 Injection technique: LOR air  Needle:  Needle type: Tuohy  Needle gauge: 17 G Needle length: 9 cm Needle insertion depth: 5 cm Catheter type: closed end flexible Catheter size: 19 Gauge Catheter at skin depth: 10 cm Test dose: negative and Other (1% lidocaine)  Assessment Events: blood not aspirated, injection not painful, no injection resistance, no paresthesia and negative IV test  Additional Notes Patient identified. Risks, benefits, and alternatives discussed with patient including but not limited to bleeding, infection, nerve damage, paralysis, failed block, incomplete pain control, headache, blood pressure changes, nausea, vomiting, reactions to medication, itching, and postpartum back pain. Confirmed with bedside nurse the patient's most recent platelet count. Confirmed with patient that they are not currently taking any anticoagulation, have any bleeding history, or any family history of bleeding disorders. Patient expressed understanding and wished to proceed. All questions were answered. Sterile technique was used throughout the entire procedure. Please see nursing notes for vital signs.   Crisp LOR on first pass. Test dose was given through epidural catheter and negative prior to continuing to dose epidural or start infusion. Warning signs of high block given to the patient including shortness of breath, tingling/numbness in hands, complete  motor block, or any concerning symptoms with instructions to call for help. Patient was given instructions on fall risk and not to get out of bed. All questions and concerns addressed with instructions to call with any issues or inadequate analgesia.  Reason for block:procedure for pain

## 2022-06-27 NOTE — Discharge Summary (Shared)
Postpartum Discharge Summary  Date of Service updated***     Patient Name: Gina Chung DOB: 1976-02-03 MRN: [id number]  Date of admission: 06/27/2022 Delivery date:06/27/2022  Delivering provider: Gerlene Fee  Date of discharge: 06/27/2022  Admitting diagnosis: Encounter for supervision of high-risk pregnancy with multigravida of advanced maternal age [O09.529] Intrauterine pregnancy: [redacted]w[redacted]d    Secondary diagnosis:  Principal Problem:   Encounter for supervision of high-risk pregnancy with multigravida of advanced maternal age  Additional problems: ***    Discharge diagnosis: Term Pregnancy Delivered                                              Post partum procedures:{Postpartum procedures:23558} Augmentation: Cytotec Complications: None  Hospital course: Induction of Labor With Vaginal Delivery   46y.o. yo [id number] [id number] admitted to the hospital 06/27/2022 for induction of labor.  Indication for induction: Elective.  Patient had an uncomplicated labor course. Membrane Rupture Time/Date: 6:29 PM ,06/27/2022   Delivery Method:Vaginal, Spontaneous  Episiotomy: None  Lacerations:  None  Details of delivery can be found in separate delivery note.  Patient had a postpartum course complicated by***. Patient is discharged home 06/27/22.  Newborn Data: Birth date:06/27/2022  Birth time:6:29 PM  Gender:Female  Living status:Living  Apgars:9 ,9  Weight:   Magnesium Sulfate received: No BMZ received: No Rhophylac:No MMR:No T-DaP:{Tdap:23962} Flu: {F{TKP:54656}ransfusion:{Transfusion received:30440034}  Physical exam  Vitals:   06/27/22 1800 06/27/22 1805 06/27/22 1820 06/27/22 1900  BP: 113/60 116/60 (!) 112/56 (!) 117/55  Pulse: 78 79 82 75  Resp:      Temp:      TempSrc:      Weight:      Height:       General: {Exam; general:21111117} Lochia: {Desc; appropriate/inappropriate:30686::"appropriate"} Uterine Fundus: {Desc; firm/soft:30687} Incision:  {Exam; incision:21111123} DVT Evaluation: {Exam; dvt:2111122} Labs: Lab Results  Component Value Date   WBC 5.9 06/27/2022   HGB 10.0 (L) 06/27/2022   HCT 31.1 (L) 06/27/2022   MCV 90.4 06/27/2022   PLT 257 06/27/2022      Latest Ref Rng & Units 04/27/2022   10:48 AM  CMP  Glucose 70 - 99 mg/dL 143   BUN 6 - 24 mg/dL 5   Creatinine 0.57 - 1.00 mg/dL 0.47   Sodium 134 - 144 mmol/L 137   Potassium 3.5 - 5.2 mmol/L 4.3   Chloride 96 - 106 mmol/L 105   CO2 20 - 29 mmol/L 18   Calcium 8.7 - 10.2 mg/dL 8.5   Total Protein 6.0 - 8.5 g/dL 6.4   Total Bilirubin 0.0 - 1.2 mg/dL <0.2   Alkaline Phos 44 - 121 IU/L 112   AST 0 - 40 IU/L 12   ALT 0 - 32 IU/L 7    Edinburgh Score:    03/29/2020    8:48 AM  Edinburgh Postnatal Depression Scale Screening Tool  I have been able to laugh and see the funny side of things. 0  I have looked forward with enjoyment to things. 0  I have blamed myself unnecessarily when things went wrong. 0  I have been anxious or worried for no good reason. 0  I have felt scared or panicky for no good reason. 0  Things have been getting on top of me. 0  I have been so unhappy that I have  had difficulty sleeping. 0  I have felt sad or miserable. 0  I have been so unhappy that I have been crying. 0  The thought of harming myself has occurred to me. 0  Edinburgh Postnatal Depression Scale Total 0     After visit meds:  Allergies as of 06/27/2022   No Known Allergies   Med Rec must be completed prior to using this [facility]***        Discharge home in stable condition Infant Feeding: Bottle and Breast Infant Disposition:home with mother Discharge instruction: per After Visit Summary and Postpartum booklet. Activity: Advance as tolerated. Pelvic rest for 6 weeks.  Diet: routine diet Future Appointments:No future appointments. Follow up Visit:  Message sent to [facility] by Autry-Lott on 06/27/2022   Please schedule this patient for a Virtual  postpartum visit in 6 weeks with the following provider: Any provider. Additional Postpartum F/U: None   High risk pregnancy complicated by:  AMA, Obesity, multiparity Delivery mode:  Vaginal, Spontaneous  Anticipated Birth Control:   Patch   06/27/2022 Simone Autry-Lott, DO

## 2022-06-27 NOTE — Anesthesia Preprocedure Evaluation (Signed)
Anesthesia Evaluation  Patient identified by MRN, date of birth, ID band Patient awake    Reviewed: Allergy & Precautions, Patient's Chart, lab work & pertinent test results  History of Anesthesia Complications Negative for: history of anesthetic complications  Airway Mallampati: II  TM Distance: >3 FB Neck ROM: Full    Dental no notable dental hx.    Pulmonary neg pulmonary ROS,    Pulmonary exam normal        Cardiovascular negative cardio ROS Normal cardiovascular exam     Neuro/Psych  Headaches, negative psych ROS   GI/Hepatic negative GI ROS, Neg liver ROS,   Endo/Other  diabetes, Type 2  Renal/GU negative Renal ROS  negative genitourinary   Musculoskeletal negative musculoskeletal ROS (+)   Abdominal   Peds  Hematology  (+) Blood dyscrasia, anemia ,   Anesthesia Other Findings Day of surgery medications reviewed with patient.  Reproductive/Obstetrics (+) Pregnancy                             Anesthesia Physical Anesthesia Plan  ASA: 2  Anesthesia Plan: Epidural   Post-op Pain Management:    Induction:   PONV Risk Score and Plan: Treatment may vary due to age or medical condition  Airway Management Planned: Natural Airway  Additional Equipment: Fetal Monitoring  Intra-op Plan:   Post-operative Plan:   Informed Consent: I have reviewed the patients History and Physical, chart, labs and discussed the procedure including the risks, benefits and alternatives for the proposed anesthesia with the patient or authorized representative who has indicated his/her understanding and acceptance.       Plan Discussed with:   Anesthesia Plan Comments:         Anesthesia Quick Evaluation

## 2022-06-28 LAB — CBC
HCT: 27.9 % — ABNORMAL LOW (ref 36.0–46.0)
Hemoglobin: 9.3 g/dL — ABNORMAL LOW (ref 12.0–15.0)
MCH: 30 pg (ref 26.0–34.0)
MCHC: 33.3 g/dL (ref 30.0–36.0)
MCV: 90 fL (ref 80.0–100.0)
Platelets: 251 10*3/uL (ref 150–400)
RBC: 3.1 MIL/uL — ABNORMAL LOW (ref 3.87–5.11)
RDW: 15.9 % — ABNORMAL HIGH (ref 11.5–15.5)
WBC: 9.9 10*3/uL (ref 4.0–10.5)
nRBC: 0 % (ref 0.0–0.2)

## 2022-06-28 LAB — RPR: RPR Ser Ql: NONREACTIVE

## 2022-06-28 MED ORDER — SODIUM CHLORIDE 0.9 % IV SOLN
500.0000 mg | Freq: Once | INTRAVENOUS | Status: AC
  Administered 2022-06-28: 500 mg via INTRAVENOUS
  Filled 2022-06-28: qty 500

## 2022-06-28 MED ORDER — FAMOTIDINE 20 MG PO TABS
20.0000 mg | ORAL_TABLET | Freq: Two times a day (BID) | ORAL | Status: DC | PRN
  Administered 2022-06-28: 20 mg via ORAL
  Filled 2022-06-28: qty 1

## 2022-06-28 NOTE — Lactation Note (Signed)
This note was copied from a baby's chart. Lactation Consultation Note  Patient Name: Gina Chung [id number] Date: 06/28/2022 Reason for consult: Initial assessment Age:44 hours  P6, Encouraged breastfeeding before offering formula. Feed on demand with cues.  Goal 8-12+ times per day after first 24 hrs.  Place baby STS if not cueing.  Birth parent denies questions or concerns.  Maternal Data Has patient been taught Hand Expression?: Yes Does the patient have breastfeeding experience prior to this delivery?: Yes How long did the patient breastfeed?: one year  Feeding Mother's Current Feeding Choice: Breast Milk and Formula Nipple Type: Extra Slow Flow   Interventions Interventions: Education;LC Services brochure  Consult Status Consult Status: Follow-up Date: 06/29/22 Follow-up type: In-patient    Vivianne Master [facility] 06/28/2022, 10:00 AM

## 2022-06-28 NOTE — Progress Notes (Signed)
Post Partum Day #1 Subjective: no complaints, up ad lib, and tolerating PO; contraceptive discussion> leaning towards Mirena especially in light of endometrial hyperplasia hx; breast and bottlefeeding; she desires a circumcision for her son- she was consented and a note placed in his chart; feels a little dizzy w ambulation and has difficulty w Fe pills  Objective: Blood pressure 102/61, pulse 77, temperature (!) 97.4 F (36.3 C), temperature source Oral, resp. rate 18, height 5\' 2"  (1.575 m), weight 86.2 kg, last menstrual period 09/27/2021, SpO2 98 %, unknown if currently breastfeeding.  Physical Exam:  General: alert, cooperative, and no distress Lochia: appropriate Uterine Fundus: firm DVT Evaluation: No evidence of DVT seen on physical exam.  Recent Labs    06/27/22 0937 06/28/22 0422  HGB 10.0* 9.3*  HCT 31.1* 27.9*    Assessment/Plan: Plan for discharge tomorrow and Circumcision prior to discharge- consented Venofer ordered for symptomatic mild anemia   LOS: 1 day   Myrtis Ser, CNM 06/28/2022, 12:08 PM

## 2022-06-28 NOTE — Anesthesia Postprocedure Evaluation (Signed)
Anesthesia Post Note  Patient: Gina Chung  Procedure(s) Performed: AN AD HOC LABOR EPIDURAL     Patient location during evaluation: Mother Baby Anesthesia Type: Epidural Level of consciousness: awake and alert Pain management: pain level controlled Vital Signs Assessment: post-procedure vital signs reviewed and stable Respiratory status: spontaneous breathing, nonlabored ventilation and respiratory function stable Cardiovascular status: stable Postop Assessment: no headache, no backache, epidural receding, no apparent nausea or vomiting, patient able to bend at knees, adequate PO intake and able to ambulate Anesthetic complications: no   No notable events documented.  Last Vitals:  Vitals:   06/28/22 0505 06/28/22 0752  BP: (!) 101/55 102/61  Pulse: 75 77  Resp: 18 18  Temp: 36.6 C (!) 36.3 C  SpO2:  98%    Last Pain:  Vitals:   06/28/22 0752  TempSrc: Oral  PainSc: 0-No pain   Pain Goal:                   [employer]

## 2022-06-29 ENCOUNTER — Encounter: Payer: Medicaid Other | Admitting: Obstetrics and Gynecology

## 2022-06-29 ENCOUNTER — Other Ambulatory Visit (HOSPITAL_COMMUNITY): Payer: Self-pay

## 2022-06-29 MED ORDER — ACETAMINOPHEN 325 MG PO TABS
650.0000 mg | ORAL_TABLET | ORAL | 0 refills | Status: DC | PRN
  Filled 2022-06-29: qty 30, 3d supply, fill #0

## 2022-06-29 MED ORDER — IBUPROFEN 600 MG PO TABS
600.0000 mg | ORAL_TABLET | Freq: Four times a day (QID) | ORAL | 0 refills | Status: AC
  Filled 2022-06-29: qty 30, 8d supply, fill #0

## 2022-06-29 MED ORDER — BENZOCAINE-MENTHOL 20-0.5 % EX AERO
1.0000 | INHALATION_SPRAY | CUTANEOUS | 0 refills | Status: DC | PRN
  Filled 2022-06-29: qty 78, fill #0

## 2022-06-29 MED ORDER — SENNOSIDES-DOCUSATE SODIUM 8.6-50 MG PO TABS
2.0000 | ORAL_TABLET | Freq: Every day | ORAL | 0 refills | Status: DC
  Filled 2022-06-29: qty 30, 15d supply, fill #0

## 2022-07-06 ENCOUNTER — Encounter: Payer: Medicaid Other | Admitting: Obstetrics and Gynecology

## 2022-07-08 ENCOUNTER — Telehealth (HOSPITAL_COMMUNITY): Payer: Self-pay | Admitting: *Deleted

## 2022-07-08 NOTE — Telephone Encounter (Signed)
Mom reports feeling good. No concerns about herself at this time. EPDS=2 [facility] score=0) Mom reports baby is doing well. Feeding, peeing, and pooping without difficulty. Safe sleep reviewed. Mom reports no concerns about baby at present.  Odis Hollingshead, RN 07-08-2022 at 9:12am

## 2022-08-17 ENCOUNTER — Ambulatory Visit (INDEPENDENT_AMBULATORY_CARE_PROVIDER_SITE_OTHER): Payer: Commercial Managed Care - HMO | Admitting: Family Medicine

## 2022-08-17 ENCOUNTER — Encounter: Payer: Self-pay | Admitting: Family Medicine

## 2022-08-17 DIAGNOSIS — Z30430 Encounter for insertion of intrauterine contraceptive device: Secondary | ICD-10-CM | POA: Diagnosis not present

## 2022-08-17 DIAGNOSIS — Z1231 Encounter for screening mammogram for malignant neoplasm of breast: Secondary | ICD-10-CM | POA: Diagnosis not present

## 2022-08-17 LAB — POCT URINE PREGNANCY: Preg Test, Ur: NEGATIVE

## 2022-08-17 MED ORDER — LEVONORGESTREL 20 MCG/DAY IU IUD: 1.0000 | INTRAUTERINE_SYSTEM | Freq: Once | INTRAUTERINE | Status: DC

## 2022-08-17 MED ORDER — LEVONORGESTREL 20.1 MCG/DAY IU IUD
1.0000 | INTRAUTERINE_SYSTEM | Freq: Once | INTRAUTERINE | Status: AC
  Administered 2022-08-17: 1 via INTRAUTERINE

## 2022-08-17 MED ORDER — LEVONORGESTREL 20.1 MCG/DAY IU IUD: 1.0000 | INTRAUTERINE_SYSTEM | Freq: Once | INTRAUTERINE | Status: DC

## 2022-08-17 NOTE — Progress Notes (Signed)
Post Partum Visit Note  Gina Chung is a 46 y.o. [id number] female who presents for a postpartum visit. She is 7 weeks postpartum following a normal spontaneous vaginal delivery.  I have fully reviewed the prenatal and intrapartum course. The delivery was at 39 gestational weeks.  Anesthesia: epidural. Postpartum course has been uneventful. Baby is doing well. Baby is feeding by both breast and formula. Bleeding no bleeding. Bowel function is normal. Bladder function is  normal with occasional leaking with coughing . Patient is not sexually active. Contraception method is IUD. Postpartum depression screening: negative.   The pregnancy intention screening data noted above was reviewed. Potential methods of contraception were discussed. The patient elected to proceed with No data recorded.   Health Maintenance Due  Topic Date Due   COVID-19 Vaccine (1) Never done   FOOT EXAM  Never done   OPHTHALMOLOGY EXAM  Never done   PAP SMEAR-Modifier  01/03/2021   INFLUENZA VACCINE  04/14/2022   HEMOGLOBIN A1C  06/25/2022   Review of Systems Pertinent items are noted in HPI.  Objective:  LMP 09/27/2021    General:  alert and no distress   Breasts:  not indicated  Lungs: Normal work of breathing  Heart:  Regular heart rate noted  Abdomen: gravid    Wound N/a  GU exam:  normal       Edinburgh Postnatal Depression Scale - 08/17/22 1042       Edinburgh Postnatal Depression Scale:  In the Past 7 Days   I have been able to laugh and see the funny side of things. 0    I have looked forward with enjoyment to things. 0    I have blamed myself unnecessarily when things went wrong. 3    I have been anxious or worried for no good reason. 0    I have felt scared or panicky for no good reason. 0    Things have been getting on top of me. 0    I have been so unhappy that I have had difficulty sleeping. 0    I have felt sad or miserable. 0    I have been so unhappy that I have been crying. 1     The thought of harming myself has occurred to me. 0    Edinburgh Postnatal Depression Scale Total 4              Assessment:   1. Encounter for routine postpartum follow-up Doing well, no concerns. Bonding and breastfeeding going well.   2. Encounter for insertion of mirena IUD  Post-Placental IUD Insertion Procedure Note  Patient identified, informed consent signed, signed copy in chart, time out was performed.     Mirena/Liletta   Cervix cleansed with betadine swabs. A uterine sound was used to measure uterine size to depth of 6cm.   A Mirena IUD was placed into the endometrial cavity, and was not fully deployed. No more available Mirena IUDs in office and patient agreed to different IUD v. Waiting on Mirena.   A Liletta was then properly deployed in the endometrial cavity and secured. The applicator was removed. The strings were trimmed to 2 centimeters.  All equipment was removed and accounted for.There were no complications and the patient tolerated the procedure well.    Patient given post procedure instructions and IUD care card with expiration date. Patient verbalized an understanding of the plan of care and agrees.   - POCT urine pregnancy - levonorgestrel (LILETTA)  20.1 MCG/DAY IUD 1 each   Plan:   Essential components of care per ACOG recommendations:  1.  Mood and well being: Patient with negative depression screening today. Reviewed local resources for support.  - Patient tobacco use? No.   - hx of drug use? No.    2. Infant care and feeding:  -Patient currently breastmilk feeding? Yes. Reviewed importance of draining breast regularly to support lactation.  -Social determinants of health (SDOH) reviewed in EPIC. No concerns  3. Sexuality, contraception and birth spacing - Reviewed reproductive life planning. Reviewed contraceptive methods based on pt preferences and effectiveness.  Patient desired IUD or IUS today.   - Discussed birth spacing of 18  months  4. Sleep and fatigue -Encouraged family/partner/community support of 4 hrs of uninterrupted sleep to help with mood and fatigue  5. Physical Recovery  - Discussed patients delivery and complications. She describes her labor as good. - Patient had a Vaginal, no problems at delivery. There were no lacerations. Perineal healing reviewed. Patient expressed understanding - Patient has urinary incontinence? Yes. Discussed role of pelvic floor PT.- occasional coughing with urination; encouraged to call if PT desired - Patient is safe to resume physical and sexual activity  6.  Health Maintenance - HM due items addressed Needs to be scheduled for mammogram. - Last pap smear  Diagnosis  Date Value Ref Range Status  09/03/2016   Final   NEGATIVE FOR INTRAEPITHELIAL LESIONS OR MALIGNANCY.   Pap smear not done at today's visit. Will need to reschedule for this. Appears to have document pap in 2019 will need to clarify.  -Breast Cancer screening indicated? Yes. Patient referred today for mammogram.   7. Chronic Disease/Pregnancy Condition follow up: n/a  - PCP follow up  Lavonda Jumbo, DO Center for [employer], [facility]

## 2022-08-17 NOTE — Patient Instructions (Signed)

## 2022-08-17 NOTE — Progress Notes (Signed)
    Post Partum Visit Note  Gina Chung is a 46 y.o. [id number] female who presents for a postpartum visit. She is 7 weeks postpartum following a normal spontaneous vaginal delivery.  I have fully reviewed the prenatal and intrapartum course. The delivery was at 39 gestational weeks.  Anesthesia: epidural. Postpartum course has been going well. Baby is doing well. Baby is feeding by both breast and bottle - Similac Sensitive RS. Bleeding no bleeding. Bowel function is normal. Bladder function is normal. Patient is not sexually active. Contraception method is none. Postpartum depression screening: negative. Patient would like Mirena IUD for contraception.

## 2022-09-09 ENCOUNTER — Other Ambulatory Visit (HOSPITAL_COMMUNITY)
Admission: RE | Admit: 2022-09-09 | Discharge: 2022-09-09 | Disposition: A | Discharge status: Home / Self Care | Payer: Commercial Managed Care - HMO | Source: Ambulatory Visit | Attending: Obstetrics and Gynecology | Admitting: Obstetrics and Gynecology

## 2022-09-09 ENCOUNTER — Ambulatory Visit (INDEPENDENT_AMBULATORY_CARE_PROVIDER_SITE_OTHER): Payer: Commercial Managed Care - HMO | Admitting: Obstetrics and Gynecology

## 2022-09-09 ENCOUNTER — Encounter: Payer: Self-pay | Admitting: Obstetrics and Gynecology

## 2022-09-09 VITALS — BP 130/87 | HR 74 | Ht 65.0 in | Wt 191.6 lb

## 2022-09-09 DIAGNOSIS — Z8742 Personal history of other diseases of the female genital tract: Secondary | ICD-10-CM | POA: Diagnosis not present

## 2022-09-09 DIAGNOSIS — Z124 Encounter for screening for malignant neoplasm of cervix: Secondary | ICD-10-CM | POA: Insufficient documentation

## 2022-09-09 DIAGNOSIS — N938 Other specified abnormal uterine and vaginal bleeding: Secondary | ICD-10-CM | POA: Diagnosis not present

## 2022-09-09 DIAGNOSIS — Z30432 Encounter for removal of intrauterine contraceptive device: Secondary | ICD-10-CM

## 2022-09-09 DIAGNOSIS — Z3009 Encounter for other general counseling and advice on contraception: Secondary | ICD-10-CM | POA: Diagnosis not present

## 2022-09-09 MED ORDER — SLYND 4 MG PO TABS: 1.0000 | ORAL_TABLET | Freq: Every day | ORAL | 3 refills | Status: DC

## 2022-09-09 NOTE — Progress Notes (Signed)
Patient presents for IUD check and Pap. Patient states that she changes her pad 7 times per day. States that pads are not full, but changes for cleanliness. Full pads are about 3 times per day. States that she is not sexually active at this time, and the side effects are not worth it for her to keep it in. Also, complains of some intermittent pain. Patient would like IUD to be removed. No other concerns.

## 2022-09-09 NOTE — Progress Notes (Signed)
IUD string check (placed 06/27/22) PP Pap due

## 2022-09-09 NOTE — Progress Notes (Signed)
   RETURN GYNECOLOGY VISIT  Subjective:  Gina Chung is a 46 y.o. [phone] with IUD in place presenting for contraceptive counseling and pap test  See nursing documentation. In brief, had IUD placed 08/17/22 and she would like it removed. Does not like her bleeding profile. Her husband is moving and she does not anticipate being sexually active. Is interested in other options.  Reported hx endometrial hyperplasia prior to G7 pregnancy (2021), only path in our system is benign EMB from 11/03/2018. No recurrence of AUB.   Objective:   Vitals:   09/09/22 1135  BP: 130/87  Pulse: 74  Weight: 191 lb 9.6 oz (86.9 kg)  Height: 5\' 5"  (1.651 m)   General:  Alert, oriented and cooperative. Patient is in no acute distress.  Skin: Skin is warm and dry. No rash noted.   Cardiovascular: Normal heart rate noted  Respiratory: Normal respiratory effort, no problems with respiration noted  Abdomen: Soft, nontender   Pelvic: NEFG, normal cervix. IUD strings visualized & IUD removed intact (see procedure note below)   Assessment and Plan:  Gina Chung is a 46 y.o. presenting for cervical cancer screening and IUD removal.  Gina Chung was seen today for gynecologic exam.  Diagnoses and all orders for this visit:  Encounter for other general counseling or advice on contraception History of endometrial hyperplasia Encounter for IUD removal Discussed r/b of continuing IUD vs transitioning to different methods of contraception. Has history of migraine with aura, estrogen contraindicated. After discussion, she would like to move forward with IUD removal and start POPs. See procedure note below -     Drospirenone (SLYND) 4 MG TABS; Take 1 tablet by mouth daily.  Cervical cancer screening -     Cytology - PAP( [facility]ran)  Return in about 1 year (around 09/10/2023) for annual exam.  09/12/2023, MD     GYNECOLOGY OFFICE PROCEDURE NOTE  Gina Chung is a 46 y.o. [phone] here for IUD  removal.   IUD Removal  Patient identified, informed consent performed, consent signed.  Patient was in the dorsal lithotomy position, normal external genitalia was noted.  A speculum was placed in the patient's vagina, normal discharge was noted, no lesions. The cervix was visualized, no lesions, no abnormal discharge.  The strings of the IUD were grasped and pulled using ring forceps. The IUD was removed in its entirety.   Patient tolerated the procedure well.    Patient will use Slynd for contraception.  Routine preventative health maintenance measures emphasized.  [id number], MD Obstetrician & Gynecologist, [facility] for [facility]

## 2022-09-09 NOTE — Patient Instructions (Signed)
Bleeding and cramping is normal for the next few days.  Please let us know if you have: - severe pain that doesn't get better with over the counter pain medicine - heavy bleeding (more than 1 pad soaked in 1 hour) - fevers  Your pap and HPV test will be back in around 1 week. If everything is normal, we will get another pap in 5 years.

## 2022-09-15 LAB — CYTOLOGY - PAP
Comment: NEGATIVE
Diagnosis: UNDETERMINED — AB
High risk HPV: NEGATIVE

## 2022-11-03 ENCOUNTER — Other Ambulatory Visit: Payer: Self-pay | Admitting: Obstetrics and Gynecology

## 2022-11-03 DIAGNOSIS — Z30430 Encounter for insertion of intrauterine contraceptive device: Secondary | ICD-10-CM

## 2022-11-03 DIAGNOSIS — Z1231 Encounter for screening mammogram for malignant neoplasm of breast: Secondary | ICD-10-CM

## 2022-11-05 ENCOUNTER — Ambulatory Visit
Admission: RE | Admit: 2022-11-05 | Discharge: 2022-11-05 | Disposition: A | Discharge status: Home / Self Care | Payer: Medicaid Other | Source: Ambulatory Visit | Attending: Obstetrics and Gynecology | Admitting: Obstetrics and Gynecology

## 2022-11-05 DIAGNOSIS — Z1231 Encounter for screening mammogram for malignant neoplasm of breast: Secondary | ICD-10-CM

## 2022-11-11 ENCOUNTER — Other Ambulatory Visit: Payer: Self-pay | Admitting: Obstetrics and Gynecology

## 2022-11-11 DIAGNOSIS — R928 Other abnormal and inconclusive findings on diagnostic imaging of breast: Secondary | ICD-10-CM

## 2022-11-23 ENCOUNTER — Other Ambulatory Visit: Payer: BLUE CROSS/BLUE SHIELD

## 2022-11-27 ENCOUNTER — Ambulatory Visit
Admission: RE | Admit: 2022-11-27 | Discharge: 2022-11-27 | Disposition: A | Discharge status: Home / Self Care | Payer: BLUE CROSS/BLUE SHIELD | Source: Ambulatory Visit | Attending: Obstetrics and Gynecology | Admitting: Obstetrics and Gynecology

## 2022-11-27 ENCOUNTER — Ambulatory Visit: Admission: RE | Admit: 2022-11-27 | Payer: BLUE CROSS/BLUE SHIELD | Source: Ambulatory Visit

## 2022-11-27 DIAGNOSIS — R928 Other abnormal and inconclusive findings on diagnostic imaging of breast: Secondary | ICD-10-CM

## 2023-01-14 ENCOUNTER — Other Ambulatory Visit (HOSPITAL_COMMUNITY): Payer: Self-pay

## 2024-05-31 ENCOUNTER — Encounter (HOSPITAL_COMMUNITY): Payer: Self-pay

## 2024-05-31 ENCOUNTER — Ambulatory Visit (HOSPITAL_COMMUNITY)
Admission: EM | Admit: 2024-05-31 | Discharge: 2024-05-31 | Disposition: A | Discharge status: Home / Self Care | Attending: Internal Medicine | Admitting: Internal Medicine

## 2024-05-31 DIAGNOSIS — Z3202 Encounter for pregnancy test, result negative: Secondary | ICD-10-CM | POA: Diagnosis not present

## 2024-05-31 DIAGNOSIS — R300 Dysuria: Secondary | ICD-10-CM | POA: Diagnosis not present

## 2024-05-31 LAB — POCT URINALYSIS DIP (MANUAL ENTRY)
Bilirubin, UA: NEGATIVE
Blood, UA: NEGATIVE
Glucose, UA: NEGATIVE mg/dL
Ketones, POC UA: NEGATIVE mg/dL
Leukocytes, UA: NEGATIVE
Nitrite, UA: NEGATIVE
Protein Ur, POC: NEGATIVE mg/dL
Spec Grav, UA: 1.02 (ref 1.010–1.025)
Urobilinogen, UA: 0.2 U/dL
pH, UA: 6 (ref 5.0–8.0)

## 2024-05-31 LAB — POCT URINE PREGNANCY: Preg Test, Ur: NEGATIVE

## 2024-05-31 NOTE — Medical Student Note (Signed)
 [facility] Insurance account manager Note For educational purposes for Medical, PA and NP students only and not part of the legal medical record.   CSN: [id number] Arrival date & time: 05/31/24  1821      History   Chief Complaint No chief complaint on file.   HPI Gina Chung is a 48 y.o. female.  Pt with a hx of DM and anemia presents with 2 days of dysuria, nausea, HA and fatigue. She denies any fever, chills, flank pain, hematuria, frequency, urgency, or hesitancy. She denies any vaginal discharge, pruritus, or irritation. She reports that she has increased her water intake for the past two days in an attempt to help her symptoms.   The history is provided by the patient.    Past Medical History:  Diagnosis Date   Anemia    Diabetes mellitus without complication (HCC)    Gestational diabetes    History of UTI    Language barrier    arabic and english   Migraine with aura    Vaginal Pap smear, abnormal     Patient Active Problem List   Diagnosis Date Noted   Endometrial hyperplasia 02/11/2022    Past Surgical History:  Procedure Laterality Date   COLPOSCOPY     DILATION AND EVACUATION N/A 09/11/2016   Procedure: DILATATION AND EVACUATION;  Surgeon: Ozell LITTIE Cowman, MD;  Location: [facility];  Service: Gynecology;  Laterality: N/A;    OB History     Gravida  8   Para  6   Term  6   Preterm  0   AB  2   Living  6      SAB  2   IAB  0   Ectopic  0   Multiple  0   Live Births  6            Home Medications    Prior to Admission medications   Medication Sig Start Date End Date Taking? Authorizing Provider  acetaminophen  (TYLENOL ) 325 MG tablet Take 2 tablets (650 mg total) by mouth every 4 (four) hours as needed (for pain scale < 4). 06/29/22   Mercado-Ortiz, Harlene RODES, DO  Drospirenone  (SLYND ) 4 MG TABS Take 1 tablet by mouth daily. 09/09/22   Constant, Peggy, MD  ibuprofen  (ADVIL ) 600 MG tablet Take 1 tablet (600 mg total) by  mouth every 6 (six) hours. 06/29/22   Mercado-Ortiz, Harlene RODES, DO  Prenatal Vit-Fe Fumarate-FA (PRENATAL PLUS VITAMIN/MINERAL) 27-1 MG TABS Take 1 tablet by mouth daily after breakfast. 12/24/21   Vannie Cornell SAUNDERS, CNM    Family History Family History  Problem Relation Age of Onset   Rheum arthritis Mother    Hypertension Mother    Hypertension Father    Rheum arthritis Maternal Grandmother    Diabetes Maternal Grandfather     Social History Social History   Tobacco Use   Smoking status: Never   Smokeless tobacco: Never  Vaping Use   Vaping status: Never Used  Substance Use Topics   Alcohol use: No   Drug use: No     Allergies   Patient has no known allergies.   Review of Systems Review of Systems  Constitutional:  Positive for fatigue. Negative for chills and fever.  Gastrointestinal:  Positive for nausea. Negative for abdominal pain and diarrhea.  Genitourinary:  Positive for dysuria. Negative for flank pain, frequency and hematuria.  Musculoskeletal:  Negative for myalgias.  Neurological:  Positive for headaches.  All other systems reviewed and are negative.    Physical Exam Updated Vital Signs BP 137/87 (BP Location: Left Arm)   Pulse 77   Temp 98.5 F (36.9 C) (Oral)   Resp 18   SpO2 98%   Physical Exam Constitutional:      Appearance: Normal appearance. She is not ill-appearing.  Cardiovascular:     Rate and Rhythm: Normal rate.     Pulses: Normal pulses.     Heart sounds: Normal heart sounds.  Pulmonary:     Effort: Pulmonary effort is normal.     Breath sounds: Normal breath sounds.  Abdominal:     Palpations: Abdomen is soft.     Tenderness: There is abdominal tenderness (suprapubic tenderness). There is no right CVA tenderness, left CVA tenderness, guarding or rebound.  Neurological:     Mental Status: She is alert.      ED Treatments / Results  Labs (all labs ordered are listed, but only abnormal results are displayed) Labs  Reviewed  POCT URINE PREGNANCY  POCT URINALYSIS DIP (MANUAL ENTRY)    EKG  Radiology No results found.  Procedures Procedures (including critical care time)  Medications Ordered in ED Medications - No data to display   Initial Impression / Assessment and Plan / ED Course  I have reviewed the triage vital signs and the nursing notes.  Pertinent labs & imaging results that were available during my care of the patient were reviewed by me and considered in my medical decision making (see chart for details).     POC urine was negative.   It is unclear what is causing the patient's symptoms at this juncture. Pt counseled to monitor her symptoms and continue to increase water intake. Instructed pt to take APAP for HA and dysuria pain. Instructed pt to return if she develops new symptoms or gets worse. Red flag symptoms reviewed and return precautions given.    Final Clinical Impressions(s) / ED Diagnoses   Final diagnoses:  Dysuria    New Prescriptions New Prescriptions   No medications on file

## 2024-05-31 NOTE — Discharge Instructions (Addendum)
????? ????? ????? ???? ????? ????.  ?? ???? ?? ?????? ??????? ??????? ???????.  ???? ??? ????? ????? ?? ????? ?????? ??? ????? ?????.  ???? ????????? ???? ???? ?????? ??? ?????? ?????????? ??????? ?????? ????????. ?? ????? ??? ??? ????? ?? ????? ??????? ???????? ??? ???? ??? ?? ??? ??? ??????.  ??? ???? ????? ?? ?????? ??????? ???????? ?? ??? ???? ???? ???? ?? ???????? ?? ??? ?????? ?? ????? ??? ?????? ?? ????? ????? ????? ??? ????? ?????? ?? ?????? ?????? ??????? ?? ??? ?????? ????? ?????? ??????? ??? ???? ?????? ??? ?????.     Your urinalysis looks good today. There are no signs of urinary tract infection. Please drink plenty of water to stay well-hydrated. Avoid beverages that are dehydrating such as coffee, soda, tea, and juices.  Any other drink other than water can be irritating to the urinary tract.  This might be causing some of your symptoms.  If you get worse in the next few days or if you develop fever, chills, persistent vomiting, worsening abdominal pain, new urinary symptoms such as urinary frequency or urinary urgency, or back pain, please come back into the clinic and we will retest your urine.

## 2024-05-31 NOTE — ED Provider Notes (Addendum)
 [facility]    CSN: [id number] Arrival date & time: 05/31/24  1821      History   Chief Complaint No chief complaint on file.   HPI Gina Chung is a 48 y.o. female.   Gina Chung is a 48 y.o. female presenting for chief complaint of dysuria and suprapubic abdominal discomfort that started 2 days ago. Reports minimal nausea, denies vomiting. Denies urinary frequency, urgency, gross hematuria, dizziness, headache, fever/chills, flank pain, vaginal symptoms, and recent antibiotic/steroid use. History of gestational diabetes, states hemoglobin A1C is usually normal when checked at PCP. LMP 05/22/2024, denies chance of pregnancy. Denies frequent intake of urinary irritants. Admits to likely subpar water intake and has been drinking more water over the last 2 days after symptom onset to try to improve dysuria. She has not attempted use of any OTC medications for symptoms at home.   The history is provided by the patient. The history is limited by a language barrier. A language interpreter was used (Video medical interpreter used for entirety of patient encounter).    Past Medical History:  Diagnosis Date   Anemia    Diabetes mellitus without complication (HCC)    Gestational diabetes    History of UTI    Language barrier    arabic and english   Migraine with aura    Vaginal Pap smear, abnormal     Patient Active Problem List   Diagnosis Date Noted   Endometrial hyperplasia 02/11/2022    Past Surgical History:  Procedure Laterality Date   COLPOSCOPY     DILATION AND EVACUATION N/A 09/11/2016   Procedure: DILATATION AND EVACUATION;  Surgeon: Ozell LITTIE Cowman, MD;  Location: [facility];  Service: Gynecology;  Laterality: N/A;    OB History     Gravida  8   Para  6   Term  6   Preterm  0   AB  2   Living  6      SAB  2   IAB  0   Ectopic  0   Multiple  0   Live Births  6            Home Medications    Prior to Admission medications    Medication Sig Start Date End Date Taking? Authorizing Provider  acetaminophen  (TYLENOL ) 325 MG tablet Take 2 tablets (650 mg total) by mouth every 4 (four) hours as needed (for pain scale < 4). 06/29/22   Mercado-Ortiz, Harlene RODES, DO  Drospirenone  (SLYND ) 4 MG TABS Take 1 tablet by mouth daily. 09/09/22   Constant, Peggy, MD  ibuprofen  (ADVIL ) 600 MG tablet Take 1 tablet (600 mg total) by mouth every 6 (six) hours. 06/29/22   Mercado-Ortiz, Harlene RODES, DO  Prenatal Vit-Fe Fumarate-FA (PRENATAL PLUS VITAMIN/MINERAL) 27-1 MG TABS Take 1 tablet by mouth daily after breakfast. 12/24/21   Gina Cornell SAUNDERS, CNM    Family History Family History  Problem Relation Age of Onset   Rheum arthritis Mother    Hypertension Mother    Hypertension Father    Rheum arthritis Maternal Grandmother    Diabetes Maternal Grandfather     Social History Social History   Tobacco Use   Smoking status: Never   Smokeless tobacco: Never  Vaping Use   Vaping status: Never Used  Substance Use Topics   Alcohol use: No   Drug use: No     Allergies   Patient has no known allergies.   Review of Systems  Review of Systems Per HPI  Physical Exam Triage Vital Signs ED Triage Vitals [05/31/24 1954]  Encounter Vitals Group     BP 137/87     Girls Systolic BP Percentile      Girls Diastolic BP Percentile      Boys Systolic BP Percentile      Boys Diastolic BP Percentile      Pulse Rate 77     Resp 18     Temp 98.5 F (36.9 C)     Temp Source Oral     SpO2 98 %     Weight      Height      Head Circumference      Peak Flow      Pain Score      Pain Loc      Pain Education      Exclude from Growth Chart    No data found.  Updated Vital Signs BP 137/87 (BP Location: Left Arm)   Pulse 77   Temp 98.5 F (36.9 C) (Oral)   Resp 18   LMP 05/22/2024   SpO2 98%   Breastfeeding No   Visual Acuity Right Eye Distance:   Left Eye Distance:   Bilateral Distance:    Right Eye Near:   Left Eye  Near:    Bilateral Near:     Physical Exam Vitals and nursing note reviewed.  Constitutional:      Appearance: She is not ill-appearing or toxic-appearing.  HENT:     Head: Normocephalic and atraumatic.     Right Ear: Hearing and external ear normal.     Left Ear: Hearing and external ear normal.     Nose: Nose normal.     Mouth/Throat:     Lips: Pink.  Eyes:     General: Lids are normal. Vision grossly intact. Gaze aligned appropriately.     Extraocular Movements: Extraocular movements intact.     Conjunctiva/sclera: Conjunctivae normal.  Pulmonary:     Effort: Pulmonary effort is normal.  Abdominal:     General: Bowel sounds are normal.     Palpations: Abdomen is soft.     Tenderness: There is no abdominal tenderness. There is no right CVA tenderness, left CVA tenderness or guarding.  Musculoskeletal:     Cervical back: Neck supple.  Skin:    General: Skin is warm and dry.     Capillary Refill: Capillary refill takes less than 2 seconds.     Findings: No rash.  Neurological:     General: No focal deficit present.     Mental Status: She is alert and oriented to person, place, and time. Mental status is at baseline.     Cranial Nerves: No dysarthria or facial asymmetry.  Psychiatric:        Mood and Affect: Mood normal.        Speech: Speech normal.        Behavior: Behavior normal.        Thought Content: Thought content normal.        Judgment: Judgment normal.      UC Treatments / Results  Labs (all labs ordered are listed, but only abnormal results are displayed) Labs Reviewed  POCT URINALYSIS DIP (MANUAL ENTRY) - Abnormal; Notable for the following components:      Result Value   Clarity, UA turbid (*)    All other components within normal limits  POCT URINE PREGNANCY    EKG   Radiology No results  found.  Procedures Procedures (including critical care time)  Medications Ordered in UC Medications - No data to display  Initial Impression /  Assessment and Plan / UC Course  I have reviewed the triage vital signs and the nursing notes.  Pertinent labs & imaging results that were available during my care of the patient were reviewed by me and considered in my medical decision making (see chart for details).   1. Dysuria Urinalysis shows turbid urine and is otherwise unremarkable. No leukocytes or hematuria.  Low suspicion for UTI, pyelonephritis, ureteral stone, etc. Mild dysuria and suprapubic discomfort likely secondary to poor water intake/mild dehydration.  She appears well hydrated on exam today and is a candidate for oral rehydration.  Advised to avoid urinary irritants, push water, and use zofran  every 8 hours as needed for nausea.  Follow-up with UC/PCP if symptoms fail to improve in the next 2-3 days with recommendations.   Urine pregnancy is negative.  Counseled patient on potential for adverse effects with medications prescribed/recommended today, strict ER and return-to-clinic precautions discussed, patient verbalized understanding.    Final Clinical Impressions(s) / UC Diagnoses   Final diagnoses:  Dysuria     Discharge Instructions      ????? ????? ????? ???? ????? ????.  ?? ???? ?? ?????? ??????? ??????? ???????.  ???? ??? ????? ????? ?? ????? ?????? ??? ????? ?????.  ???? ????????? ???? ???? ?????? ??? ?????? ?????????? ??????? ?????? ????????. ?? ????? ??? ??? ????? ?? ????? ??????? ???????? ??? ???? ??? ?? ??? ??? ??????.  ??? ???? ????? ?? ?????? ??????? ???????? ?? ??? ???? ???? ???? ?? ???????? ?? ??? ?????? ?? ????? ??? ?????? ?? ????? ????? ????? ??? ????? ?????? ?? ?????? ?????? ??????? ?? ??? ?????? ????? ?????? ??????? ??? ???? ?????? ??? ?????.   Your urinalysis looks good today. There are no signs of urinary tract infection. Please drink plenty of water to stay well-hydrated. Avoid beverages that are dehydrating such as coffee, soda, tea, and juices.  Any other drink other than water  can be irritating to the urinary tract.  This might be causing some of your symptoms.  If you get worse in the next few days or if you develop fever, chills, persistent vomiting, worsening abdominal pain, new urinary symptoms such as urinary frequency or urinary urgency, or back pain, please come back into the clinic and we will retest your urine.     ED Prescriptions   None    PDMP not reviewed this encounter.   Enedelia Dorna HERO, FNP 06/04/24 1042    Enedelia Dorna HERO, [place] 06/04/24 1043

## 2024-05-31 NOTE — ED Triage Notes (Addendum)
 Patient states lower abdominal pain and dysuria x 2 days. Patient reports fatigue and a headache started today.

## 2024-06-08 ENCOUNTER — Other Ambulatory Visit: Payer: Self-pay

## 2024-06-08 DIAGNOSIS — M542 Cervicalgia: Secondary | ICD-10-CM | POA: Diagnosis present

## 2024-06-08 DIAGNOSIS — Y92410 Unspecified street and highway as the place of occurrence of the external cause: Secondary | ICD-10-CM | POA: Diagnosis not present

## 2024-06-08 DIAGNOSIS — M545 Low back pain, unspecified: Secondary | ICD-10-CM | POA: Insufficient documentation
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
## 2024-06-09 ENCOUNTER — Emergency Department (HOSPITAL_BASED_OUTPATIENT_CLINIC_OR_DEPARTMENT_OTHER)
Admission: EM | Admit: 2024-06-09 | Discharge: 2024-06-09 | Disposition: A | Discharge status: Home / Self Care | Attending: Emergency Medicine | Admitting: Emergency Medicine

## 2024-06-09 ENCOUNTER — Emergency Department (HOSPITAL_BASED_OUTPATIENT_CLINIC_OR_DEPARTMENT_OTHER): Admitting: Radiology

## 2024-06-09 DIAGNOSIS — S161XXA Strain of muscle, fascia and tendon at neck level, initial encounter: Secondary | ICD-10-CM

## 2024-06-09 DIAGNOSIS — S39012A Strain of muscle, fascia and tendon of lower back, initial encounter: Secondary | ICD-10-CM

## 2024-06-09 MED ORDER — CYCLOBENZAPRINE HCL 10 MG PO TABS: 10.0000 mg | ORAL_TABLET | Freq: Three times a day (TID) | ORAL | 0 refills | Status: AC | PRN

## 2024-06-09 NOTE — Discharge Instructions (Signed)
 Begin taking ibuprofen  600 mg every 6 hours as needed for pain.  Begin taking Flexeril  as needed for pain not relieved with ibuprofen .  Rest.  Follow-up with primary doctor if not improving in the next week.
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
## 2024-06-16 ENCOUNTER — Encounter: Payer: Self-pay | Admitting: Family Medicine

## 2024-06-16 ENCOUNTER — Other Ambulatory Visit: Payer: Self-pay

## 2024-06-16 ENCOUNTER — Ambulatory Visit: Admitting: Family Medicine

## 2024-06-16 VITALS — BP 134/82 | HR 82 | Ht 63.0 in | Wt 168.6 lb

## 2024-06-16 DIAGNOSIS — M47812 Spondylosis without myelopathy or radiculopathy, cervical region: Secondary | ICD-10-CM

## 2024-06-16 DIAGNOSIS — E663 Overweight: Secondary | ICD-10-CM | POA: Diagnosis not present

## 2024-06-16 LAB — POCT GLYCOSYLATED HEMOGLOBIN (HGB A1C): Hemoglobin A1C: 6 % — AB (ref 4.0–5.6)

## 2024-06-16 MED ORDER — MELOXICAM 15 MG PO TABS
15.0000 mg | ORAL_TABLET | Freq: Every day | ORAL | 0 refills | Status: DC
  Filled 2024-06-16: qty 30, 30d supply, fill #0
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
## 2024-07-04 ENCOUNTER — Other Ambulatory Visit: Payer: Self-pay

## 2024-08-18 ENCOUNTER — Encounter: Payer: Self-pay | Admitting: Family Medicine

## 2024-08-18 ENCOUNTER — Other Ambulatory Visit: Payer: Self-pay

## 2024-08-18 ENCOUNTER — Ambulatory Visit: Payer: Self-pay | Admitting: Family Medicine

## 2024-08-18 VITALS — BP 124/84 | HR 65 | Ht 64.0 in | Wt 165.8 lb

## 2024-08-18 DIAGNOSIS — Z0000 Encounter for general adult medical examination without abnormal findings: Secondary | ICD-10-CM

## 2024-08-18 DIAGNOSIS — Z1211 Encounter for screening for malignant neoplasm of colon: Secondary | ICD-10-CM

## 2024-08-18 DIAGNOSIS — R5383 Other fatigue: Secondary | ICD-10-CM | POA: Diagnosis not present

## 2024-08-18 DIAGNOSIS — D649 Anemia, unspecified: Secondary | ICD-10-CM | POA: Diagnosis not present

## 2024-08-18 DIAGNOSIS — R7303 Prediabetes: Secondary | ICD-10-CM | POA: Diagnosis not present

## 2024-08-18 DIAGNOSIS — G43839 Menstrual migraine, intractable, without status migrainosus: Secondary | ICD-10-CM | POA: Diagnosis not present

## 2024-08-18 DIAGNOSIS — E611 Iron deficiency: Secondary | ICD-10-CM

## 2024-08-18 DIAGNOSIS — Z23 Encounter for immunization: Secondary | ICD-10-CM | POA: Diagnosis not present

## 2024-08-18 MED ORDER — SUMATRIPTAN SUCCINATE 100 MG PO TABS
100.0000 mg | ORAL_TABLET | ORAL | 1 refills | Status: AC | PRN
  Filled 2024-08-18: qty 9, 30d supply, fill #0

## 2024-08-18 NOTE — Progress Notes (Signed)
 Name: Gina Chung   Date of Visit: 08/18/24   Date of last visit with me: 06/16/2024   CHIEF COMPLAINT:  Chief Complaint  Patient presents with   Annual Exam    Cpe. Fasting. Wants to talk about prediabetes. Never had colonoscopy,        HPI:  Discussed the use of AI scribe software for clinical note transcription with the patient, who gave verbal consent to proceed.  History of Present Illness   Gina Chung is a 48 year old female who presents with fatigue and heavy menstrual bleeding.  She experiences persistent fatigue, which she attributes to low hemoglobin levels that have never exceeded ten. Despite not being tested for blood disorders, she experiences heavy menstrual periods lasting eight days every 28 days. She has tried various hormonal treatments including the pill, Nexplanon , and an IUD, but discontinued them due to side effects such as bleeding, weight gain, and headaches.  She experiences migraines monthly, typically around her menstrual cycle. Previously, she had migraines with aura, but now she experiences headaches without visual changes. She has tried ibuprofen  for a month without relief and finds some relief with coffee, though the migraines persist.  She occasionally takes over-the-counter vitamin D  and omega-3 supplements but is not consistent with daily intake. She has been told she is prediabetic, not diabetic, and her doctor mentioned that genetics and family history may play a role. She is originally from [place].         OBJECTIVE:       08/18/2024    9:32 AM  Depression screen PHQ 2/9  Decreased Interest 0  Down, Depressed, Hopeless 0  PHQ - 2 Score 0     BP Readings from Last 3 Encounters:  08/18/24 124/84  06/16/24 134/82  06/09/24 98/63    BP 124/84   Pulse 65   Ht 5' 4 (1.626 m)   Wt 165 lb 12.8 oz (75.2 kg)   SpO2 99%   BMI 28.46 kg/m    Physical Exam          Physical Exam Constitutional:      Appearance: Normal  appearance.  Neurological:     General: No focal deficit present.     Mental Status: She is alert and oriented to person, place, and time. Mental status is at baseline.     ASSESSMENT/PLAN:   Assessment & Plan Need for pneumococcal 20-valent conjugate vaccination  Annual physical exam  Anemia, unspecified type  Iron  deficiency  Other fatigue  Prediabetes  Intractable menstrual migraine without status migrainosus  Encounter for screening colonoscopy    Assessment and Plan    Adult Wellness Visit Routine wellness visit with focus on health maintenance. - Ordered blood work to monitor health status. - Encouraged regular exercise and healthy diet. -Comprehensive annual physical exam completed today. Reviewed interval history, current medical issues, medications, allergies, and preventive care needs. Addressed all patient questions and concerns. Discussed lifestyle factors including diet, exercise, sleep, and stress management. Reviewed recommended age-appropriate screenings, labs, and vaccinations. Counseling provided on healthy habits and routine health maintenance. Follow-up as indicated based on findings and results. - Referral for colonoscopy  - PCV vaccine today.  Menstrual migraine Migraines occur monthly around menstrual cycle. Discussed sumatriptan  for acute treatment with fewer side effects than ibuprofen . - Prescribed sumatriptan  for acute migraine treatment. - Instructed to take one pill at onset of migraine, and a second if no improvement within two hours, up to 200 mg in 24  hours.  Heavy menstrual bleeding with iron  deficiency anemia and fatigue Heavy bleeding contributes to iron  deficiency anemia and fatigue. Previous hormonal treatments caused side effects. No prior iron  level testing. - Ordered iron  level test. - Ordered hemoglobin and hematocrit tests. - Ordered vitamin D  level test.  Prediabetes Genetic factors likely influence prediabetes. Current weight  not a concern, but lifestyle changes recommended to prevent progression. - Ordered blood work to monitor glucose levels. - Encouraged weight loss through diet and exercise.  Screening for colon cancer Due for colon cancer screening at age 18. Colonoscopy recommended due to high risk in [place]. Cologuard mentioned as alternative. - Referred for colonoscopy.         Rasul Decola A. Vita MD [facility]

## 2024-08-19 LAB — COMPREHENSIVE METABOLIC PANEL WITH GFR
ALT: 10 IU/L (ref 0–32)
AST: 14 IU/L (ref 0–40)
Albumin: 4.2 g/dL (ref 3.9–4.9)
Alkaline Phosphatase: 80 IU/L (ref 41–116)
BUN/Creatinine Ratio: 18 (ref 9–23)
BUN: 12 mg/dL (ref 6–24)
Bilirubin Total: 0.3 mg/dL (ref 0.0–1.2)
CO2: 20 mmol/L (ref 20–29)
Calcium: 9.2 mg/dL (ref 8.7–10.2)
Chloride: 104 mmol/L (ref 96–106)
Creatinine, Ser: 0.66 mg/dL (ref 0.57–1.00)
Globulin, Total: 2.6 g/dL (ref 1.5–4.5)
Glucose: 88 mg/dL (ref 70–99)
Potassium: 4.5 mmol/L (ref 3.5–5.2)
Sodium: 139 mmol/L (ref 134–144)
Total Protein: 6.8 g/dL (ref 6.0–8.5)
eGFR: 109 mL/min/1.73 (ref >59)

## 2024-08-19 LAB — CBC WITH DIFFERENTIAL/PLATELET
Basophils Absolute: 0 x10E3/uL (ref 0.0–0.2)
Basos: 0 %
EOS (ABSOLUTE): 0.1 x10E3/uL (ref 0.0–0.4)
Eos: 3 %
Hematocrit: 32 % — ABNORMAL LOW (ref 34.0–46.6)
Hemoglobin: 10 g/dL — ABNORMAL LOW (ref 11.1–15.9)
Immature Grans (Abs): 0 x10E3/uL (ref 0.0–0.1)
Immature Granulocytes: 0 %
Lymphocytes Absolute: 2.6 x10E3/uL (ref 0.7–3.1)
Lymphs: 55 %
MCH: 27.2 pg (ref 26.6–33.0)
MCHC: 31.3 g/dL — ABNORMAL LOW (ref 31.5–35.7)
MCV: 87 fL (ref 79–97)
Monocytes Absolute: 0.3 x10E3/uL (ref 0.1–0.9)
Monocytes: 6 %
Neutrophils Absolute: 1.7 x10E3/uL (ref 1.4–7.0)
Neutrophils: 36 %
Platelets: 375 x10E3/uL (ref 150–450)
RBC: 3.68 x10E6/uL — ABNORMAL LOW (ref 3.77–5.28)
RDW: 13.6 % (ref 11.7–15.4)
WBC: 4.7 x10E3/uL (ref 3.4–10.8)

## 2024-08-19 LAB — LIPID PANEL
Chol/HDL Ratio: 3.3 ratio (ref 0.0–4.4)
Cholesterol, Total: 159 mg/dL (ref 100–199)
HDL: 48 mg/dL (ref >39)
LDL Chol Calc (NIH): 97 mg/dL (ref 0–99)
Triglycerides: 75 mg/dL (ref 0–149)
VLDL Cholesterol Cal: 14 mg/dL (ref 5–40)

## 2024-08-19 LAB — IRON,TIBC AND FERRITIN PANEL
Ferritin: 8 ng/mL — ABNORMAL LOW (ref 15–150)
Iron Saturation: 9 % — CL (ref 15–55)
Iron: 39 ug/dL (ref 27–159)
Total Iron Binding Capacity: 445 ug/dL (ref 250–450)
UIBC: 406 ug/dL (ref 131–425)

## 2024-08-19 LAB — THYROID CASCADE PROFILE: TSH: 4.28 u[IU]/mL (ref 0.450–4.500)

## 2024-08-19 LAB — VITAMIN D 25 HYDROXY (VIT D DEFICIENCY, FRACTURES): Vit D, 25-Hydroxy: 27 ng/mL — ABNORMAL LOW (ref 30.0–100.0)

## 2024-08-22 ENCOUNTER — Ambulatory Visit: Payer: Self-pay | Admitting: Family Medicine

## 2024-08-22 ENCOUNTER — Encounter: Admitting: Family Medicine

## 2024-08-22 DIAGNOSIS — E559 Vitamin D deficiency, unspecified: Secondary | ICD-10-CM

## 2024-08-22 DIAGNOSIS — D508 Other iron deficiency anemias: Secondary | ICD-10-CM

## 2024-08-22 MED ORDER — IRON (FERROUS SULFATE) 325 (65 FE) MG PO TABS: 325.0000 mg | ORAL_TABLET | Freq: Every day | ORAL | 3 refills | Status: DC

## 2024-08-22 MED ORDER — VITAMIN D (ERGOCALCIFEROL) 1.25 MG (50000 UNIT) PO CAPS: 50000.0000 [IU] | ORAL_CAPSULE | ORAL | 0 refills | Status: AC

## 2024-08-23 ENCOUNTER — Encounter: Payer: Self-pay | Admitting: Family Medicine

## 2024-08-28 ENCOUNTER — Other Ambulatory Visit: Payer: Self-pay

## 2024-08-29 ENCOUNTER — Other Ambulatory Visit: Payer: Self-pay

## 2024-09-13 ENCOUNTER — Other Ambulatory Visit: Payer: Self-pay | Admitting: Internal Medicine

## 2024-09-13 ENCOUNTER — Other Ambulatory Visit (HOSPITAL_COMMUNITY): Payer: Self-pay | Admitting: Family Medicine

## 2024-09-13 ENCOUNTER — Encounter (HOSPITAL_COMMUNITY): Payer: Self-pay | Admitting: Family Medicine

## 2024-09-13 ENCOUNTER — Telehealth (HOSPITAL_COMMUNITY): Payer: Self-pay | Admitting: Pharmacy Technician

## 2024-09-13 DIAGNOSIS — D509 Iron deficiency anemia, unspecified: Secondary | ICD-10-CM | POA: Insufficient documentation

## 2024-09-13 NOTE — Telephone Encounter (Signed)
 Auth Submission: NO AUTH NEEDED Site of care: [facility] Payer: [employer]  Medication & CPT/J Code(s) submitted: Feraheme  (ferumoxytol ) [id number] Diagnosis Code: D64.9, D50.9 Route of submission (phone, fax, portal):  Phone # Fax # Auth type: Buy/Bill HB Units/visits requested: 510mg  x 2 doses Reference number:  Approval from: 09/13/2024 to 11/12/24    Dagoberto Armour, CPhT [facility] Phone: [phone] 09/13/2024

## 2024-09-13 NOTE — Telephone Encounter (Signed)
" ° °  Placed iron  infusion order  "

## 2024-09-28 ENCOUNTER — Inpatient Hospital Stay (HOSPITAL_COMMUNITY): Admission: RE | Admit: 2024-09-28 | Source: Ambulatory Visit

## 2024-09-29 ENCOUNTER — Encounter (HOSPITAL_COMMUNITY)
Admission: RE | Admit: 2024-09-29 | Discharge: 2024-09-29 | Disposition: A | Discharge status: Home / Self Care | Source: Ambulatory Visit | Attending: Family Medicine | Admitting: Family Medicine

## 2024-09-29 VITALS — BP 115/78 | HR 68 | Temp 98.0°F | Resp 16

## 2024-09-29 DIAGNOSIS — D509 Iron deficiency anemia, unspecified: Secondary | ICD-10-CM | POA: Insufficient documentation

## 2024-09-29 DIAGNOSIS — D649 Anemia, unspecified: Secondary | ICD-10-CM

## 2024-09-29 MED ORDER — SODIUM CHLORIDE 0.9 % IV SOLN
510.0000 mg | Freq: Once | INTRAVENOUS | Status: AC
  Administered 2024-09-29: 510 mg via INTRAVENOUS

## 2024-09-29 MED ORDER — SODIUM CHLORIDE 0.9 % IV SOLN
INTRAVENOUS | Status: AC
  Filled 2024-09-29: qty 17

## 2024-10-05 ENCOUNTER — Encounter (HOSPITAL_COMMUNITY)

## 2024-10-06 ENCOUNTER — Encounter (HOSPITAL_COMMUNITY): Admission: RE | Admit: 2024-10-06 | Source: Ambulatory Visit

## 2024-10-06 VITALS — BP 113/67 | HR 73 | Temp 98.2°F | Resp 15

## 2024-10-06 DIAGNOSIS — D509 Iron deficiency anemia, unspecified: Secondary | ICD-10-CM

## 2024-10-06 DIAGNOSIS — D649 Anemia, unspecified: Secondary | ICD-10-CM

## 2024-10-06 MED ORDER — SODIUM CHLORIDE 0.9 % IV SOLN
INTRAVENOUS | Status: AC
  Filled 2024-10-06: qty 17

## 2024-10-06 MED ORDER — SODIUM CHLORIDE 0.9 % IV SOLN
510.0000 mg | Freq: Once | INTRAVENOUS | Status: AC
  Administered 2024-10-06: 510 mg via INTRAVENOUS

## 2024-10-08 ENCOUNTER — Encounter (HOSPITAL_COMMUNITY): Payer: Self-pay | Admitting: Emergency Medicine

## 2024-10-08 ENCOUNTER — Encounter (HOSPITAL_COMMUNITY): Payer: Self-pay | Admitting: Family Medicine

## 2024-10-08 ENCOUNTER — Emergency Department (HOSPITAL_COMMUNITY)

## 2024-10-08 ENCOUNTER — Emergency Department (HOSPITAL_COMMUNITY)
Admission: EM | Admit: 2024-10-08 | Discharge: 2024-10-09 | Disposition: A | Attending: Emergency Medicine | Admitting: Emergency Medicine

## 2024-10-08 DIAGNOSIS — I6200 Nontraumatic subdural hemorrhage, unspecified: Secondary | ICD-10-CM | POA: Insufficient documentation

## 2024-10-08 DIAGNOSIS — M545 Low back pain, unspecified: Secondary | ICD-10-CM | POA: Insufficient documentation

## 2024-10-08 DIAGNOSIS — E119 Type 2 diabetes mellitus without complications: Secondary | ICD-10-CM | POA: Diagnosis not present

## 2024-10-08 DIAGNOSIS — S065XAA Traumatic subdural hemorrhage with loss of consciousness status unknown, initial encounter: Secondary | ICD-10-CM

## 2024-10-08 DIAGNOSIS — R519 Headache, unspecified: Secondary | ICD-10-CM | POA: Diagnosis present

## 2024-10-08 LAB — BASIC METABOLIC PANEL WITH GFR
Anion gap: 10 (ref 5–15)
BUN: 16 mg/dL (ref 6–20)
CO2: 22 mmol/L (ref 22–32)
Calcium: 9.1 mg/dL (ref 8.9–10.3)
Chloride: 104 mmol/L (ref 98–111)
Creatinine, Ser: 0.62 mg/dL (ref 0.44–1.00)
GFR, Estimated: >60 mL/min
Glucose, Bld: 93 mg/dL (ref 70–99)
Potassium: 4.4 mmol/L (ref 3.5–5.1)
Sodium: 136 mmol/L (ref 135–145)

## 2024-10-08 LAB — CBC WITH DIFFERENTIAL/PLATELET
Abs Immature Granulocytes: 0.02 10*3/uL (ref 0.00–0.07)
Basophils Absolute: 0 10*3/uL (ref 0.0–0.1)
Basophils Relative: 0 %
Eosinophils Absolute: 0.1 10*3/uL (ref 0.0–0.5)
Eosinophils Relative: 1 %
HCT: 29.6 % — ABNORMAL LOW (ref 36.0–46.0)
Hemoglobin: 9.3 g/dL — ABNORMAL LOW (ref 12.0–15.0)
Immature Granulocytes: 0 %
Lymphocytes Relative: 41 %
Lymphs Abs: 3.1 10*3/uL (ref 0.7–4.0)
MCH: 27.9 pg (ref 26.0–34.0)
MCHC: 31.4 g/dL (ref 30.0–36.0)
MCV: 88.9 fL (ref 80.0–100.0)
Monocytes Absolute: 0.6 10*3/uL (ref 0.1–1.0)
Monocytes Relative: 7 %
Neutro Abs: 3.8 10*3/uL (ref 1.7–7.7)
Neutrophils Relative %: 51 %
Platelets: 267 10*3/uL (ref 150–400)
RBC: 3.33 MIL/uL — ABNORMAL LOW (ref 3.87–5.11)
RDW: 16.6 % — ABNORMAL HIGH (ref 11.5–15.5)
WBC: 7.6 10*3/uL (ref 4.0–10.5)
nRBC: 0.3 % — ABNORMAL HIGH (ref 0.0–0.2)

## 2024-10-08 LAB — HCG, SERUM, QUALITATIVE: Preg, Serum: NEGATIVE

## 2024-10-08 MED ORDER — METOCLOPRAMIDE HCL 5 MG/ML IJ SOLN
10.0000 mg | Freq: Once | INTRAMUSCULAR | Status: AC
  Administered 2024-10-08: 10 mg via INTRAVENOUS
  Filled 2024-10-08: qty 2

## 2024-10-08 MED ORDER — IOHEXOL 350 MG/ML SOLN
75.0000 mL | Freq: Once | INTRAVENOUS | Status: AC | PRN
  Administered 2024-10-08: 75 mL via INTRAVENOUS

## 2024-10-08 MED ORDER — MAGNESIUM SULFATE IN D5W 1-5 GM/100ML-% IV SOLN
1.0000 g | Freq: Once | INTRAVENOUS | Status: AC
  Administered 2024-10-08: 1 g via INTRAVENOUS
  Filled 2024-10-08: qty 100

## 2024-10-08 MED ORDER — DIPHENHYDRAMINE HCL 50 MG/ML IJ SOLN
12.5000 mg | Freq: Once | INTRAMUSCULAR | Status: AC
  Administered 2024-10-08: 12.5 mg via INTRAVENOUS
  Filled 2024-10-08: qty 1

## 2024-10-08 MED ORDER — LACTATED RINGERS IV BOLUS
1000.0000 mL | Freq: Once | INTRAVENOUS | Status: AC
  Administered 2024-10-08: 1000 mL via INTRAVENOUS

## 2024-10-08 MED ORDER — PROCHLORPERAZINE EDISYLATE 10 MG/2ML IJ SOLN
10.0000 mg | Freq: Once | INTRAMUSCULAR | Status: AC
  Administered 2024-10-08: 10 mg via INTRAVENOUS
  Filled 2024-10-08: qty 2

## 2024-10-08 MED ORDER — DEXAMETHASONE SOD PHOSPHATE PF 10 MG/ML IJ SOLN
10.0000 mg | Freq: Once | INTRAMUSCULAR | Status: AC
  Administered 2024-10-08: 10 mg via INTRAVENOUS
  Filled 2024-10-08: qty 1

## 2024-10-08 MED ORDER — ACETAMINOPHEN 500 MG PO TABS
1000.0000 mg | ORAL_TABLET | Freq: Once | ORAL | Status: AC
  Administered 2024-10-08: 1000 mg via ORAL
  Filled 2024-10-08: qty 2
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
## 2024-10-08 NOTE — ED Provider Notes (Signed)
 " Gina Chung Provider Note   CSN: [id number] Arrival date & time: 10/08/24  1527     Patient presents with: Migraine   Gina Chung is a 49 y.o. female with history of diabetes, migraines, degenerative disc disease presents with complaints of headache that started earlier this morning.  Associated with nausea without vomiting.  No vision changes, extremity weakness, difficulty speaking or ambulating. No URI symptoms. No head trauma. Does report that she has been having some back pain with radicular symptoms as well.  Has known degenerative disc disease and has received injections in the past.  Is still ambulatory.  No urinary or fecal incontinence.  No IV drug use or history of malignancy.  Does not smoke, drink alcohol or use cocaine.   HPI    Past Medical History:  Diagnosis Date   Anemia    Diabetes mellitus without complication (HCC)    Gestational diabetes    History of UTI    Language barrier    arabic and english   Migraine with aura    Vaginal Pap smear, abnormal    Past Surgical History:  Procedure Laterality Date   COLPOSCOPY     DILATION AND EVACUATION N/A 09/11/2016   Procedure: DILATATION AND EVACUATION;  Surgeon: Ozell LITTIE Cowman, MD;  Location: [facility];  Service: Gynecology;  Laterality: N/A;     Prior to Admission medications  Medication Sig Start Date End Date Taking? Authorizing Provider  acetaminophen  (TYLENOL ) 325 MG tablet Take 2 tablets (650 mg total) by mouth every 4 (four) hours as needed (for pain scale < 4). 06/29/22   Mercado-Ortiz, Harlene RODES, DO  cyclobenzaprine  (FLEXERIL ) 10 MG tablet Take 1 tablet (10 mg total) by mouth 3 (three) times daily as needed. 06/09/24   Geroldine Berg, MD  ibuprofen  (ADVIL ) 600 MG tablet Take 1 tablet (600 mg total) by mouth every 6 (six) hours. 06/29/22   Mercado-Ortiz, Harlene RODES, DO  Iron , Ferrous Sulfate , 325 (65 Fe) MG TABS Take 325 mg by mouth daily. 08/22/24    Jha, Panav, MD  meloxicam  (MOBIC ) 15 MG tablet Take 1 tablet (15 mg total) by mouth daily. 06/16/24   Jha, Panav, MD  SUMAtriptan  (IMITREX ) 100 MG tablet Take 1 tablet (100 mg total) by mouth every 2 (two) hours as needed for migraine. May repeat in 2 hours if headache persists or recurs. DO NOT exceed 200mg  in a 24 hour period. 08/18/24   Jha, Panav, MD  Vitamin D , Ergocalciferol , (DRISDOL ) 1.25 MG (50000 UNIT) CAPS capsule Take 1 capsule (50,000 Units total) by mouth every 7 (seven) days. 08/22/24   Jha, Panav, MD    Allergies: Patient has no known allergies.    Review of Systems  Neurological:  Positive for headaches.    Updated Vital Signs BP 124/81   Pulse 76   Temp 98.2 F (36.8 C)   Resp 16   SpO2 100%   Physical Exam Vitals and nursing note reviewed.  Constitutional:      General: She is not in acute distress.    Appearance: She is well-developed.  HENT:     Head: Normocephalic and atraumatic.  Eyes:     Conjunctiva/sclera: Conjunctivae normal.  Cardiovascular:     Rate and Rhythm: Normal rate and regular rhythm.     Heart sounds: No murmur heard. Pulmonary:     Effort: Pulmonary effort is normal. No respiratory distress.     Breath sounds: Normal breath  sounds.  Abdominal:     Palpations: Abdomen is soft.     Tenderness: There is no abdominal tenderness.  Musculoskeletal:        General: No swelling.     Cervical back: Neck supple.  Skin:    General: Skin is warm and dry.     Capillary Refill: Capillary refill takes less than 2 seconds.  Neurological:     Mental Status: She is alert.     Comments: Patient is alert and oriented. There is no abnormal phonation. Symmetric smile without facial droop. No pronator drift. Moves all extremities spontaneously. 5/5 strength in upper and lower extremities. . No sensation deficit. There is no nystagmus. EOMI, PERRL. Coordination intact with finger to nose and normal ambulation. No meningismus    Psychiatric:        Mood and  Affect: Mood normal.     (all labs ordered are listed, but only abnormal results are displayed) Labs Reviewed  CBC WITH DIFFERENTIAL/PLATELET - Abnormal; Notable for the following components:      Result Value   RBC 3.33 (*)    Hemoglobin 9.3 (*)    HCT 29.6 (*)    RDW 16.6 (*)    nRBC 0.3 (*)    All other components within normal limits  HCG, SERUM, QUALITATIVE  BASIC METABOLIC PANEL WITH GFR    EKG: EKG Interpretation Date/Time:  Sunday October 08 2024 20:20:03 EST Ventricular Rate:  66 PR Interval:  135 QRS Duration:  87 QT Interval:  368 QTC Calculation: 386 R Axis:   47  Text Interpretation: Sinus rhythm Low voltage, precordial leads Confirmed by Yolande Charleston [phone]) on 10/08/2024 8:33:47 PM    .Ultrasound ED Peripheral IV (Provider)  Date/Time: 10/08/2024 4:04 PM  Performed by: Donnajean Lynwood DEL, PA-C Authorized by: Donnajean Lynwood DEL, PA-C   Procedure details:    Indications: hydration     Skin Prep: chlorhexidine gluconate     Location:  Right forearm   Angiocath:  20 G   Bedside Ultrasound Guided: Yes     Images: not archived     Patient tolerated procedure without complications: Yes     Dressing applied: Yes      Medications Ordered in the ED  metoCLOPramide  (REGLAN ) injection 10 mg (10 mg Intravenous Given 10/08/24 1602)  diphenhydrAMINE  (BENADRYL ) injection 12.5 mg (12.5 mg Intravenous Given 10/08/24 1602)  acetaminophen  (TYLENOL ) tablet 1,000 mg (1,000 mg Oral Given 10/08/24 1603)  lactated ringers  bolus 1,000 mL (0 mLs Intravenous Stopped 10/08/24 1854)  dexamethasone  (DECADRON ) injection 10 mg (10 mg Intravenous Given 10/08/24 1828)  prochlorperazine  (COMPAZINE ) injection 10 mg (10 mg Intravenous Given 10/08/24 1828)  magnesium  sulfate IVPB 1 g 100 mL (0 g Intravenous Stopped 10/08/24 1901)  iohexol  (OMNIPAQUE ) 350 MG/ML injection 75 mL (75 mLs Intravenous Contrast Given 10/08/24 1932)    Clinical Course as of 10/08/24 2049  Sun Oct 08, 2024   1543 Patient evaluated for headache that started earlier this morning in addition to low back pain with bilateral radicular symptoms without any red flag symptoms.  Upon arrival patient is mildly hypertensive.  She is overall well and comfortable appearing.  She has no concerning neurological symptoms or deficits on exam.  Will begin with headache cocktail. [JT]  1733 Patient reevaluated, no improvement in symptoms.  She does further describe this as one of the worst headaches of her life that woke her from her sleep this morning.  Will obtain CTA [JT]  1753 hCG, serum, qualitative  Negative  [JT]  1849 CBC with Differential(!) No leukocytosis, hemoglobin stable [JT]  1903 Basic metabolic panel Unremarkable [JT]  1951 CT ANGIO HEAD NECK W WO CM Subdural collection over the left cerebral convexity measuring up to 9 mm in thickness, mild associated mass effect and approximately 6 mm rightward midline shift, no large vessel occlusion, hemodynamically significant stenosis or aneurysm [JT]  2001 Neurosurgery paged [JT]  2017 Discussed patient with Dr. Mavis, imaging consistent with suabcute subdural. Recommended close follow up outpatient for repeat scan, although repeat in 6 hours would be reasonable as well. Discussed plan with attending, Dr. Yolande, given the acuity of her symptoms will repeat in 6 hours.  [JT]  2047 CT Head Wo Contrast [AH]    Clinical Course User Index [AH] Arloa Chroman, PA-C [JT] Donnajean Lynwood DEL, PA-C                                 Medical Decision Making Amount and/or Complexity of Data Reviewed Labs: ordered. Decision-making details documented in ED Course. Radiology: ordered.  Risk OTC drugs. Prescription drug management.   This patient presents to the ED with chief complaint(s) of Headache .  The complaint involves an extensive differential diagnosis and also carries with it a high risk of complications and morbidity.   Pertinent past medical history  as listed in HPI  The differential diagnosis includes  subarachnoid hemorrhage, vertebral artery dissection, CVA, CVA, TIA, meningitis, head trauma  Additional history obtained: Records reviewed Care Everywhere/External Records  Disposition:   Signout given to Chroman Arloa, PA-C.  Please see her note for remainder the visit.  Disposition pending workup.   Social Determinants of Health:   none  This note was dictated with voice recognition software.  Despite best efforts at proofreading, errors may have occurred which can change the documentation meaning.       Final diagnoses:  Subdural hematoma [facility])    ED Discharge Orders     None          Donnajean Lynwood DEL, PA-C 10/08/24 2050  "

## 2024-10-08 NOTE — ED Provider Notes (Signed)
 Patient has idiopathic SDH. Current planis for repeat Ct head at 11:30 , call neurosurgery if worse - otherwise patient may be discharged Physical Exam  BP 124/81   Pulse 76   Temp 98.2 F (36.8 C)   Resp 16   SpO2 100%   Physical Exam  Procedures  Procedures  ED Course / MDM   Clinical Course as of 10/08/24 2051  [place] Oct 08, 2024  1543 Patient evaluated for headache that started earlier this morning in addition to low back pain with bilateral radicular symptoms without any red flag symptoms.  Upon arrival patient is mildly hypertensive.  She is overall well and comfortable appearing.  She has no concerning neurological symptoms or deficits on exam.  Will begin with headache cocktail. [JT]  1733 Patient reevaluated, no improvement in symptoms.  She does further describe this as one of the worst headaches of her life that woke her from her sleep this morning.  Will obtain CTA [JT]  1753 hCG, serum, qualitative Negative  [JT]  1849 CBC with Differential(!) No leukocytosis, hemoglobin stable [JT]  1903 Basic metabolic panel Unremarkable [JT]  1951 CT ANGIO HEAD NECK W WO CM Subdural collection over the left cerebral convexity measuring up to 9 mm in thickness, mild associated mass effect and approximately 6 mm rightward midline shift, no large vessel occlusion, hemodynamically significant stenosis or aneurysm [JT]  2001 Neurosurgery paged [JT]  2017 Discussed patient with Dr. Mavis, imaging consistent with suabcute subdural. Recommended close follow up outpatient for repeat scan, although repeat in 6 hours would be reasonable as well. Discussed plan with attending, Dr. Yolande, given the acuity of her symptoms will repeat in 6 hours.  [JT]  2047 CT Head Wo Contrast [AH]    Clinical Course User Index [AH] Arloa Chroman, PA-C [JT] Donnajean Lynwood DEL, PA-C   Medical Decision Making Amount and/or Complexity of Data Reviewed Labs: ordered. Decision-making details documented in ED  Course. Radiology: ordered. Decision-making details documented in ED Course.  Risk OTC drugs. Prescription drug management.   S/O TO PA BROWNING AT [facility]       Arloa Chroman, PA-C 10/11/24 1849    Doretha Folks, MD 10/11/24 2308

## 2024-10-08 NOTE — Discharge Instructions (Addendum)
 You were evaluated in the emergency room for headache.  You are found to have a small brain bleed.  Please follow-up with the neurosurgeon this week for further evaluation and repeat imaging.  Please avoid aspirin  or other NSAIDs such as ibuprofen  and Motrin  for pain.  You may use Tylenol  1000 mg every 4-6 hours up to 4000 mg a day.  If you experience any new or worsening symptoms please return to the emergency room.
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
## 2024-10-08 NOTE — ED Triage Notes (Signed)
 Pt here from home with c/o headache that started at 2 am , has some slight nausea no vomiting , some light sensitivity

## 2024-10-09 ENCOUNTER — Emergency Department (HOSPITAL_COMMUNITY)

## 2024-10-09 ENCOUNTER — Other Ambulatory Visit: Payer: Self-pay

## 2024-10-09 ENCOUNTER — Encounter (HOSPITAL_COMMUNITY): Payer: Self-pay | Admitting: Family Medicine

## 2024-10-09 LAB — PROTIME-INR
INR: 1 (ref 0.8–1.2)
Prothrombin Time: 13.7 s (ref 11.4–15.2)

## 2024-10-09 MED ORDER — ACETAMINOPHEN 325 MG PO TABS
650.0000 mg | ORAL_TABLET | Freq: Once | ORAL | Status: AC
  Administered 2024-10-09: 650 mg via ORAL
  Filled 2024-10-09: qty 2

## 2024-10-09 MED ORDER — OXYCODONE-ACETAMINOPHEN 5-325 MG PO TABS
1.0000 | ORAL_TABLET | Freq: Four times a day (QID) | ORAL | 0 refills | Status: AC | PRN
  Filled 2024-10-09: qty 15, 4d supply, fill #0
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
## 2024-10-09 NOTE — ED Notes (Signed)
 Patient transported to CT

## 2024-10-09 NOTE — ED Provider Notes (Signed)
" °  Physical Exam  BP 119/85   Pulse 66   Temp (!) 97.4 F (36.3 C) (Oral)   Resp 18   SpO2 100%   Physical Exam  Procedures  Procedures  ED Course / MDM   Clinical Course as of 10/09/24 0729  [place] Oct 08, 2024  1543 Patient evaluated for headache that started earlier this morning in addition to low back pain with bilateral radicular symptoms without any red flag symptoms.  Upon arrival patient is mildly hypertensive.  She is overall well and comfortable appearing.  She has no concerning neurological symptoms or deficits on exam.  Will begin with headache cocktail. [JT]  1733 Patient reevaluated, no improvement in symptoms.  She does further describe this as one of the worst headaches of her life that woke her from her sleep this morning.  Will obtain CTA [JT]  1753 hCG, serum, qualitative Negative  [JT]  1849 CBC with Differential(!) No leukocytosis, hemoglobin stable [JT]  1903 Basic metabolic panel Unremarkable [JT]  1951 CT ANGIO HEAD NECK W WO CM Subdural collection over the left cerebral convexity measuring up to 9 mm in thickness, mild associated mass effect and approximately 6 mm rightward midline shift, no large vessel occlusion, hemodynamically significant stenosis or aneurysm [JT]  2001 Neurosurgery paged [JT]  2017 Discussed patient with Dr. Mavis, imaging consistent with suabcute subdural. Recommended close follow up outpatient for repeat scan, although repeat in 6 hours would be reasonable as well. Discussed plan with attending, Dr. Yolande, given the acuity of her symptoms will repeat in 6 hours.  [JT]  2047 CT Head Wo Contrast [AH]    Clinical Course User Index [AH] Arloa Chroman, PA-C [JT] Donnajean Lynwood DEL, PA-C   Medical Decision Making Amount and/or Complexity of Data Reviewed Labs: ordered. Decision-making details documented in ED Course. Radiology: ordered. Decision-making details documented in ED Course.  Risk OTC drugs. Prescription drug  management.   49 yo female presented loc with headache.  Noted to have sdh with old now with increased bleeding.  Patient awaiting ns evaluation.     "

## 2024-10-09 NOTE — ED Notes (Signed)
 Patient returned from CT
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
## 2024-10-10 ENCOUNTER — Encounter (HOSPITAL_COMMUNITY): Payer: Self-pay

## 2024-10-10 ENCOUNTER — Emergency Department (HOSPITAL_COMMUNITY)

## 2024-10-10 ENCOUNTER — Other Ambulatory Visit: Payer: Self-pay

## 2024-10-10 ENCOUNTER — Emergency Department (HOSPITAL_COMMUNITY)
Admission: EM | Admit: 2024-10-10 | Discharge: 2024-10-10 | Disposition: A | Discharge status: Home / Self Care | Attending: Emergency Medicine | Admitting: Emergency Medicine

## 2024-10-10 DIAGNOSIS — I6200 Nontraumatic subdural hemorrhage, unspecified: Secondary | ICD-10-CM | POA: Insufficient documentation

## 2024-10-10 DIAGNOSIS — R519 Headache, unspecified: Secondary | ICD-10-CM | POA: Diagnosis present

## 2024-10-10 DIAGNOSIS — S065XAA Traumatic subdural hemorrhage with loss of consciousness status unknown, initial encounter: Secondary | ICD-10-CM

## 2024-10-10 LAB — CBC WITH DIFFERENTIAL/PLATELET
Abs Immature Granulocytes: 0.02 10*3/uL (ref 0.00–0.07)
Basophils Absolute: 0 10*3/uL (ref 0.0–0.1)
Basophils Relative: 0 %
Eosinophils Absolute: 0 10*3/uL (ref 0.0–0.5)
Eosinophils Relative: 1 %
HCT: 34.2 % — ABNORMAL LOW (ref 36.0–46.0)
Hemoglobin: 10.8 g/dL — ABNORMAL LOW (ref 12.0–15.0)
Immature Granulocytes: 0 %
Lymphocytes Relative: 41 %
Lymphs Abs: 3.7 10*3/uL (ref 0.7–4.0)
MCH: 28.3 pg (ref 26.0–34.0)
MCHC: 31.6 g/dL (ref 30.0–36.0)
MCV: 89.5 fL (ref 80.0–100.0)
Monocytes Absolute: 0.6 10*3/uL (ref 0.1–1.0)
Monocytes Relative: 6 %
Neutro Abs: 4.5 10*3/uL (ref 1.7–7.7)
Neutrophils Relative %: 52 %
Platelets: 332 10*3/uL (ref 150–400)
RBC: 3.82 MIL/uL — ABNORMAL LOW (ref 3.87–5.11)
RDW: 18.3 % — ABNORMAL HIGH (ref 11.5–15.5)
WBC: 8.8 10*3/uL (ref 4.0–10.5)
nRBC: 0 % (ref 0.0–0.2)

## 2024-10-10 LAB — BASIC METABOLIC PANEL WITH GFR
Anion gap: 10 (ref 5–15)
BUN: 16 mg/dL (ref 6–20)
CO2: 24 mmol/L (ref 22–32)
Calcium: 9.5 mg/dL (ref 8.9–10.3)
Chloride: 101 mmol/L (ref 98–111)
Creatinine, Ser: 0.66 mg/dL (ref 0.44–1.00)
GFR, Estimated: >60 mL/min
Glucose, Bld: 92 mg/dL (ref 70–99)
Potassium: 3.9 mmol/L (ref 3.5–5.1)
Sodium: 135 mmol/L (ref 135–145)

## 2024-10-10 MED ORDER — LEVETIRACETAM (KEPPRA) 500 MG/5 ML ADULT IV PUSH
1500.0000 mg | Freq: Once | INTRAVENOUS | Status: AC
  Administered 2024-10-10: 1500 mg via INTRAVENOUS
  Filled 2024-10-10: qty 15

## 2024-10-10 MED ORDER — METOCLOPRAMIDE HCL 10 MG PO TABS: 10.0000 mg | ORAL_TABLET | Freq: Four times a day (QID) | ORAL | 0 refills | Status: AC | PRN

## 2024-10-10 MED ORDER — PREDNISONE 20 MG PO TABS
40.0000 mg | ORAL_TABLET | Freq: Once | ORAL | Status: AC
  Administered 2024-10-10: 40 mg via ORAL
  Filled 2024-10-10: qty 2

## 2024-10-10 MED ORDER — LEVETIRACETAM 500 MG PO TABS: 500.0000 mg | ORAL_TABLET | Freq: Two times a day (BID) | ORAL | 0 refills | Status: AC

## 2024-10-10 MED ORDER — PREDNISONE 10 MG (21) PO TBPK: ORAL_TABLET | Freq: Every day | ORAL | 0 refills | Status: DC

## 2024-10-10 NOTE — ED Provider Triage Note (Signed)
 Emergency Medicine Provider Triage Evaluation Note  Gina Chung Gina Chung , a 49 y.o. female  was evaluated in triage.  Pt complains of headache. Report headache that was evaluated a few days ago and found to have SDH.  Now pt notice weakness to RLE that started today, hearing sounds distant in R ear, mildly worsening headache.  No n/v/d.   Review of Systems  Positive: As above Negative: As above  Physical Exam  BP 129/78   Pulse 71   Temp 98.1 F (36.7 C)   Resp 17   Ht 5' 4 (1.626 m)   Wt 75.2 kg   SpO2 100%   BMI 28.46 kg/m  Gen:   Awake, no distress   Resp:  Normal effort  MSK:   Moves extremities without difficulty  Other:    Medical Decision Making  Medically screening exam initiated at 4:27 PM.  Appropriate orders placed.  Gina Chung was informed that the remainder of the evaluation will be completed by another provider, this initial triage assessment does not replace that evaluation, and the importance of remaining in the ED until their evaluation is complete.  Need repeat head CT as possible worsening SDH   Nivia Colon, PA-C 10/10/24 1629
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
## 2024-10-10 NOTE — ED Triage Notes (Signed)
 Patient states that she has been having a headache with ringing in both ears, and generalized weakness. She has been diagnosed with a subdural hematoma and was told to come back to the ED if symptoms are getting worse. Patient was told she had the subdural hematoma on Sunday.

## 2024-10-10 NOTE — Discharge Instructions (Signed)
 You were seen for your headache in the emergency department.  Your subdural hematoma is the same size.  At home, please take Tylenol  and the Reglan  as needed for your headaches.  Take the prednisone  for your headache as well.  Take the Keppra  to prevent any seizures.  Check your MyChart online for the results of any tests that had not resulted by the time you left the emergency department.   Follow-up with your primary doctor in 2-3 days regarding your visit.  Follow-up with neurosurgery in 1 week  Return immediately to the emergency department if you experience any of the following: Worsening headache, new weakness or numbness, or any other concerning symptoms.    Thank you for visiting our Emergency Department. It was a pleasure taking care of you today.

## 2024-10-11 ENCOUNTER — Other Ambulatory Visit (HOSPITAL_COMMUNITY): Payer: Self-pay | Admitting: Neurosurgery

## 2024-10-11 DIAGNOSIS — S065XAA Traumatic subdural hemorrhage with loss of consciousness status unknown, initial encounter: Secondary | ICD-10-CM

## 2024-10-12 ENCOUNTER — Ambulatory Visit (HOSPITAL_BASED_OUTPATIENT_CLINIC_OR_DEPARTMENT_OTHER)
Admission: RE | Admit: 2024-10-12 | Discharge: 2024-10-12 | Disposition: A | Discharge status: Home / Self Care | Source: Ambulatory Visit | Attending: Neurosurgery | Admitting: Neurosurgery

## 2024-10-12 DIAGNOSIS — S065XAA Traumatic subdural hemorrhage with loss of consciousness status unknown, initial encounter: Secondary | ICD-10-CM | POA: Diagnosis present

## 2024-10-13 ENCOUNTER — Other Ambulatory Visit: Payer: Self-pay

## 2024-10-13 MED ORDER — OMEPRAZOLE 20 MG PO CPDR
20.0000 mg | DELAYED_RELEASE_CAPSULE | ORAL | 0 refills | Status: DC
  Filled 2024-10-13: qty 30, 30d supply, fill #0

## 2024-10-13 MED ORDER — DEXAMETHASONE 2 MG PO TABS
ORAL_TABLET | ORAL | 0 refills | Status: DC
  Filled 2024-10-13 – 2024-10-14 (×2): qty 15, 7d supply, fill #0

## 2024-10-14 ENCOUNTER — Other Ambulatory Visit (HOSPITAL_COMMUNITY): Payer: Self-pay

## 2024-10-14 ENCOUNTER — Other Ambulatory Visit: Payer: Self-pay

## 2024-10-15 ENCOUNTER — Encounter (HOSPITAL_COMMUNITY): Payer: Self-pay | Admitting: Pharmacy Technician

## 2024-10-15 ENCOUNTER — Emergency Department (HOSPITAL_COMMUNITY)

## 2024-10-15 ENCOUNTER — Emergency Department (HOSPITAL_COMMUNITY)
Admission: EM | Admit: 2024-10-15 | Discharge: 2024-10-15 | Disposition: A | Discharge status: Home / Self Care | Attending: Emergency Medicine | Admitting: Emergency Medicine

## 2024-10-15 DIAGNOSIS — S065XAA Traumatic subdural hemorrhage with loss of consciousness status unknown, initial encounter: Secondary | ICD-10-CM

## 2024-10-15 DIAGNOSIS — R519 Headache, unspecified: Secondary | ICD-10-CM | POA: Diagnosis present

## 2024-10-15 DIAGNOSIS — R930 Abnormal findings on diagnostic imaging of skull and head, not elsewhere classified: Secondary | ICD-10-CM | POA: Diagnosis not present

## 2024-10-15 DIAGNOSIS — Z79899 Other long term (current) drug therapy: Secondary | ICD-10-CM | POA: Diagnosis not present

## 2024-10-15 LAB — CBC
HCT: 33.4 % — ABNORMAL LOW (ref 36.0–46.0)
Hemoglobin: 10.8 g/dL — ABNORMAL LOW (ref 12.0–15.0)
MCH: 29.3 pg (ref 26.0–34.0)
MCHC: 32.3 g/dL (ref 30.0–36.0)
MCV: 90.5 fL (ref 80.0–100.0)
Platelets: 296 10*3/uL (ref 150–400)
RBC: 3.69 MIL/uL — ABNORMAL LOW (ref 3.87–5.11)
RDW: 19.1 % — ABNORMAL HIGH (ref 11.5–15.5)
WBC: 10.8 10*3/uL — ABNORMAL HIGH (ref 4.0–10.5)
nRBC: 0.2 % (ref 0.0–0.2)

## 2024-10-15 LAB — HCG, SERUM, QUALITATIVE: Preg, Serum: NEGATIVE

## 2024-10-15 LAB — BASIC METABOLIC PANEL WITH GFR
Anion gap: 10 (ref 5–15)
BUN: 14 mg/dL (ref 6–20)
CO2: 22 mmol/L (ref 22–32)
Calcium: 9 mg/dL (ref 8.9–10.3)
Chloride: 104 mmol/L (ref 98–111)
Creatinine, Ser: 0.66 mg/dL (ref 0.44–1.00)
GFR, Estimated: >60 mL/min
Glucose, Bld: 110 mg/dL — ABNORMAL HIGH (ref 70–99)
Potassium: 3.8 mmol/L (ref 3.5–5.1)
Sodium: 136 mmol/L (ref 135–145)

## 2024-10-15 MED ORDER — METOCLOPRAMIDE HCL 5 MG/ML IJ SOLN
10.0000 mg | Freq: Once | INTRAMUSCULAR | Status: AC
  Administered 2024-10-15: 10 mg via INTRAVENOUS
  Filled 2024-10-15: qty 2

## 2024-10-15 MED ORDER — DEXAMETHASONE 1 MG PO TABS
ORAL_TABLET | ORAL | 0 refills | Status: AC
  Filled 2024-10-15: qty 43, 10d supply, fill #0

## 2024-10-15 MED ORDER — PANTOPRAZOLE SODIUM 20 MG PO TBEC
20.0000 mg | DELAYED_RELEASE_TABLET | Freq: Two times a day (BID) | ORAL | 0 refills | Status: AC
  Filled 2024-10-15: qty 30, 15d supply, fill #0

## 2024-10-15 MED ORDER — OXYCODONE-ACETAMINOPHEN 5-325 MG PO TABS
1.0000 | ORAL_TABLET | Freq: Once | ORAL | Status: AC
  Administered 2024-10-15: 1 via ORAL
  Filled 2024-10-15: qty 1

## 2024-10-15 MED ORDER — GADOBUTROL 1 MMOL/ML IV SOLN
7.0000 mL | Freq: Once | INTRAVENOUS | Status: AC | PRN
  Administered 2024-10-15: 7 mL via INTRAVENOUS

## 2024-10-15 MED ORDER — DIPHENHYDRAMINE HCL 50 MG/ML IJ SOLN
12.5000 mg | Freq: Once | INTRAMUSCULAR | Status: AC
  Administered 2024-10-15: 12.5 mg via INTRAVENOUS
  Filled 2024-10-15: qty 1

## 2024-10-15 NOTE — ED Triage Notes (Signed)
 Pt here with reports of headache worsening over the last day. Hx subdural hematoma and was told to follow up if symptoms worsened.

## 2024-10-15 NOTE — ED Provider Notes (Addendum)
 " [place] EMERGENCY DEPARTMENT AT [facility] Provider Note   CSN: [id number] Arrival date & time: 10/15/24  9085     Patient presents with: Headache  HPI Gina Chung is a 49 y.o. female recently diagnosed with subdural hematoma presenting for headache.  States it has been intermittent since her diagnosis but worse last night and now appears to be slightly changed.  Pain is localized in the left posterior head and does radiate to the front of her head.  Still endorsing diminished sensation in the left side of her face and right lower extremity.  Denies issues with strength.  States she did take her Keppra  this morning as well.  Denies dizziness and head trauma.     Headache      Prior to Admission medications  Medication Sig Start Date End Date Taking? Authorizing Provider  acetaminophen  (TYLENOL ) 500 MG tablet Take 500 mg by mouth every 6 (six) hours as needed.   Yes [provider]  cyclobenzaprine  (FLEXERIL ) 10 MG tablet Take 1 tablet (10 mg total) by mouth 3 (three) times daily as needed. 06/09/24  Yes Delo, Vicenta, MD  ibuprofen  (ADVIL ) 600 MG tablet Take 1 tablet (600 mg total) by mouth every 6 (six) hours. 06/29/22  Yes Mercado-Ortiz, Jessica Q, DO  iron  dextran complex 1,000 mg in sodium chloride  0.9 % 500 mL Inject 1,000 mg into the vein once.   Yes [provider]  levETIRAcetam  (KEPPRA ) 500 MG tablet Take 1 tablet (500 mg total) by mouth 2 (two) times daily. 10/10/24 10/17/24 Yes Yolande Lamar BROCKS, MD  metoCLOPramide  (REGLAN ) 10 MG tablet Take 1 tablet (10 mg total) by mouth every 6 (six) hours as needed for nausea or vomiting (headaches). 10/10/24  Yes Yolande Lamar BROCKS, MD  omeprazole  (PRILOSEC) 20 MG capsule Take 1 capsule (20 mg total) by mouth. before a meal 10/13/24  Yes   oxyCODONE -acetaminophen  (PERCOCET/ROXICET) 5-325 MG tablet Take 1 tablet by mouth every 6 (six) hours as needed for severe pain (pain score 7-10). 10/09/24  Yes  Fondaw, Wylder S, PA  predniSONE  (STERAPRED UNI-PAK 21 TAB) 10 MG (21) TBPK tablet Take by mouth daily. Take 6 tabs by mouth daily  for 2 days, then 5 tabs for 2 days, then 4 tabs for 2 days, then 3 tabs for 2 days, 2 tabs for 2 days, then 1 tab by mouth daily for 2 days Patient taking differently: Take by mouth daily. 4 tabs for 2 days, then 3 tabs for 2 days, 2 tabs for 2 days, then 1 tab by mouth daily for 2 days 10/10/24  Yes Yolande Lamar BROCKS, MD  SUMAtriptan  (IMITREX ) 100 MG tablet Take 1 tablet (100 mg total) by mouth every 2 (two) hours as needed for migraine. May repeat in 2 hours if headache persists or recurs. DO NOT exceed 200mg  in a 24 hour period. 08/18/24  Yes Jha, Panav, MD  Vitamin D , Ergocalciferol , (DRISDOL ) 1.25 MG (50000 UNIT) CAPS capsule Take 1 capsule (50,000 Units total) by mouth every 7 (seven) days. 08/22/24  Yes Jha, Panav, MD  dexamethasone  (DECADRON ) 2 MG tablet Take 1 tablet (2 mg total) by mouth 3 (three) times daily for 3 days, THEN 1 tablet (2 mg total) 2 (two) times daily for 2 days, THEN 1 tablet (2 mg total) daily for 2 days. Patient not taking: Reported on 10/15/2024 10/13/24 10/21/24      Allergies: Patient has no known allergies.    Review of Systems  Neurological:  Positive for headaches.    Updated Vital Signs BP (!) 116/96   Pulse 71   Temp 98 F (36.7 C) (Oral)   Resp 13   SpO2 100%   Physical Exam Vitals and nursing note reviewed.  HENT:     Head: Normocephalic and atraumatic.     Mouth/Throat:     Mouth: Mucous membranes are moist.  Eyes:     General:        Right eye: No discharge.        Left eye: No discharge.     Conjunctiva/sclera: Conjunctivae normal.  Cardiovascular:     Rate and Rhythm: Normal rate and regular rhythm.     Pulses: Normal pulses.     Heart sounds: Normal heart sounds.  Pulmonary:     Effort: Pulmonary effort is normal.     Breath sounds: Normal breath sounds.  Abdominal:     General: Abdomen is flat.      Palpations: Abdomen is soft.  Skin:    General: Skin is warm and dry.  Neurological:     General: No focal deficit present.     Mental Status: She is alert and oriented to person, place, and time.     Cranial Nerves: Cranial nerves 2-12 are intact.     Motor: Motor function is intact.     Coordination: Coordination is intact.     Comments: Diminished sensation in the right lower extremity.  Diminished sensation on the left side of her face.  No obvious facial droop.  Grip strength 5/5 bilaterally.  Strength in upper and lower extremities symmetric and 5/5.  No pronator drift.  Patient ambulatory with steady gait.  Psychiatric:        Mood and Affect: Mood normal.     (all labs ordered are listed, but only abnormal results are displayed) Labs Reviewed  CBC - Abnormal; Notable for the following components:      Result Value   WBC 10.8 (*)    RBC 3.69 (*)    Hemoglobin 10.8 (*)    HCT 33.4 (*)    RDW 19.1 (*)    All other components within normal limits  BASIC METABOLIC PANEL WITH GFR - Abnormal; Notable for the following components:   Glucose, Bld 110 (*)    All other components within normal limits  HCG, SERUM, QUALITATIVE    EKG: None  Radiology: CT Head Wo Contrast Addendum Date: 10/15/2024 ** ADDENDUM #1 **ADDENDUM: Findings were discussed with the ordering provider on 10/15/2024 at 11:50 am, Norleen Essex, PA, verbally acknowledged these findings. ---------------------------------------------------- Electronically signed by: Waddell Calk MD 10/15/2024 11:50 AM EST RP Workstation: [id number]   Result Date: 10/15/2024 ** ORIGINAL REPORT **EXAM: CT HEAD WITHOUT CONTRAST 10/15/2024 11:00:36 AM TECHNIQUE: CT of the head was performed without the administration of intravenous contrast. Automated exposure control, iterative reconstruction, and/or weight based adjustment of the mA/kV was utilized to reduce the radiation dose to as low as reasonably achievable. COMPARISON: Compared to  a prior examination. CLINICAL HISTORY: Headache, increasing frequency or severity. FINDINGS: BRAIN AND VENTRICLES: Mixed attenuation left subdural hematoma is again noted overlying the left frontoparietal lobes. Over the left frontal lobe, this measures 0.7 cm in thickness, which is unchanged from the prior examination. There is associated mild mass effect with sulcal effacement and approximately 4 mm rightward midline shift is noted, previously 5 mm. No evidence of acute infarct. No hydrocephalus. ORBITS: No acute abnormality. SINUSES: No acute abnormality. SOFT TISSUES AND  SKULL: No acute soft tissue abnormality. No skull fracture. IMPRESSION: 1. 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 2. Mild mass effect with sulcal effacement and 4 mm rightward midline shift, slightly improved. Electronically signed by: Waddell Calk MD 10/15/2024 11:12 AM EST RP Workstation: [id number]     Procedures   Medications Ordered in the ED  metoCLOPramide  (REGLAN ) injection 10 mg (10 mg Intravenous Given 10/15/24 1027)  diphenhydrAMINE  (BENADRYL ) injection 12.5 mg (12.5 mg Intravenous Given 10/15/24 1026)                                    Medical Decision Making Amount and/or Complexity of Data Reviewed Labs: ordered. Radiology: ordered.  Risk Prescription drug management.   49 year old well-appearing female with recent diagnosis of subdural hematoma presenting for headache.  Exam notable for persistence of lack of sensation in her right lower extremity and left face but seems to be very similar to when she was here few days ago.  Discussed CT head Noncon with the radiologist Dr. Calk who reported there may be a tiny area of new bleeding but overall midline shift seems to have improved.  Discussed patient with PA Johnanna who also discussed this patient with Dr. Debby who advised  that we could potentially offer her MRI with and without contrast but they still feel that she is okay to go home if she so chooses and is feeling better.  On reassessment she stated her headache was feeling better but still present.  She states she has never had a headache like this before and would like to proceed with the MRI.  Signed out patient to PA Lonni Camp.  Anticipate discharge if MRI is unremarkable.     Final diagnoses:  Nonintractable headache, unspecified chronicity pattern, unspecified headache type    ED Discharge Orders     None          Lang Norleen POUR, PA-C 10/15/24 1358    Lang Norleen POUR, PA-C 10/15/24 1443    Lenor Hollering, MD 10/15/24 1453  "

## 2024-10-15 NOTE — ED Notes (Signed)
 Patient transported to MRI
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
## 2024-10-15 NOTE — Discharge Instructions (Addendum)
 Evaluation today was overall reassuring.  Please follow-up with neurosurgery.  If you have worsening headache, visual disturbance, worsening weakness or numbness in your extremities, facial droop or issues with speech or any other concerning symptom please return to ED for further evaluation.

## 2024-10-16 ENCOUNTER — Other Ambulatory Visit: Payer: Self-pay

## 2024-10-17 ENCOUNTER — Other Ambulatory Visit: Payer: Self-pay | Admitting: Family Medicine

## 2024-10-17 ENCOUNTER — Other Ambulatory Visit: Payer: Self-pay

## 2024-10-18 ENCOUNTER — Encounter: Payer: Self-pay | Admitting: Internal Medicine

## 2024-11-07 ENCOUNTER — Ambulatory Visit: Admitting: Family Medicine

## 2025-08-22 ENCOUNTER — Encounter: Admitting: Family Medicine
